# Patient Record
Sex: Female | Born: 1955
Health system: Southern US, Community
[De-identification: ages and names within clinical notes are randomized; demographics above are authoritative.]

## PROBLEM LIST (undated history)

## (undated) DIAGNOSIS — I428 Other cardiomyopathies: Principal | ICD-10-CM

## (undated) DIAGNOSIS — Z95810 Presence of automatic (implantable) cardiac defibrillator: Secondary | ICD-10-CM

## (undated) DIAGNOSIS — Z860100 Personal history of colon polyps, unspecified: Secondary | ICD-10-CM

## (undated) DIAGNOSIS — J449 Chronic obstructive pulmonary disease, unspecified: Secondary | ICD-10-CM

## (undated) DIAGNOSIS — I499 Cardiac arrhythmia, unspecified: Secondary | ICD-10-CM

## (undated) DIAGNOSIS — C801 Malignant (primary) neoplasm, unspecified: Secondary | ICD-10-CM

## (undated) DIAGNOSIS — D649 Anemia, unspecified: Secondary | ICD-10-CM

## (undated) DIAGNOSIS — I5022 Chronic systolic (congestive) heart failure: Secondary | ICD-10-CM

## (undated) DIAGNOSIS — I447 Left bundle-branch block, unspecified: Secondary | ICD-10-CM

## (undated) DIAGNOSIS — E119 Type 2 diabetes mellitus without complications: Secondary | ICD-10-CM

## (undated) DIAGNOSIS — R0602 Shortness of breath: Secondary | ICD-10-CM

## (undated) HISTORY — DX: Anemia, unspecified: D64.9

## (undated) HISTORY — DX: Type 2 diabetes mellitus without complications: E11.9

## (undated) HISTORY — PX: TUBAL LIGATION: SHX77

## (undated) HISTORY — DX: Cardiac arrhythmia, unspecified: I49.9

## (undated) HISTORY — PX: INSERT / REPLACE / REMOVE PACEMAKER: SUR710

## (undated) HISTORY — PX: ICD IMPLANT: EP1208

## (undated) HISTORY — DX: Chronic systolic (congestive) heart failure: I50.22

## (undated) HISTORY — DX: Other cardiomyopathies: I42.8

## (undated) HISTORY — DX: Shortness of breath: R06.02

## (undated) HISTORY — DX: Left bundle-branch block, unspecified: I44.7

## (undated) HISTORY — PX: APPENDECTOMY: SHX54

## (undated) SURGERY — LEFT HEART CATH
Anesthesia: Moderate Sedation | Laterality: Left

---

## 2014-12-06 ENCOUNTER — Emergency Department: Payer: Self-pay | Admitting: Emergency Medicine

## 2015-01-04 DIAGNOSIS — Z720 Tobacco use: Secondary | ICD-10-CM | POA: Insufficient documentation

## 2015-03-11 ENCOUNTER — Encounter: Payer: Self-pay | Admitting: Emergency Medicine

## 2015-03-11 ENCOUNTER — Emergency Department: Payer: No Typology Code available for payment source

## 2015-03-11 ENCOUNTER — Inpatient Hospital Stay
Admission: EM | Admit: 2015-03-11 | Discharge: 2015-03-16 | DRG: 190 | Disposition: A | Discharge status: Home / Self Care | Payer: No Typology Code available for payment source | Attending: Internal Medicine | Admitting: Internal Medicine

## 2015-03-11 DIAGNOSIS — Z791 Long term (current) use of non-steroidal anti-inflammatories (NSAID): Secondary | ICD-10-CM

## 2015-03-11 DIAGNOSIS — I209 Angina pectoris, unspecified: Secondary | ICD-10-CM | POA: Diagnosis present

## 2015-03-11 DIAGNOSIS — J441 Chronic obstructive pulmonary disease with (acute) exacerbation: Secondary | ICD-10-CM | POA: Diagnosis not present

## 2015-03-11 DIAGNOSIS — I447 Left bundle-branch block, unspecified: Secondary | ICD-10-CM | POA: Diagnosis present

## 2015-03-11 DIAGNOSIS — Z7951 Long term (current) use of inhaled steroids: Secondary | ICD-10-CM

## 2015-03-11 DIAGNOSIS — J449 Chronic obstructive pulmonary disease, unspecified: Secondary | ICD-10-CM | POA: Diagnosis present

## 2015-03-11 DIAGNOSIS — Z87891 Personal history of nicotine dependence: Secondary | ICD-10-CM

## 2015-03-11 DIAGNOSIS — I214 Non-ST elevation (NSTEMI) myocardial infarction: Secondary | ICD-10-CM | POA: Diagnosis present

## 2015-03-11 HISTORY — DX: Chronic obstructive pulmonary disease, unspecified: J44.9

## 2015-03-11 LAB — TROPONIN I: Troponin I: 0.06 ng/mL — ABNORMAL HIGH (ref <0.031)

## 2015-03-11 LAB — BASIC METABOLIC PANEL
Anion gap: 9 (ref 5–15)
BUN: 15 mg/dL (ref 6–20)
CO2: 25 mmol/L (ref 22–32)
CREATININE: 0.78 mg/dL (ref 0.44–1.00)
Calcium: 8.7 mg/dL — ABNORMAL LOW (ref 8.9–10.3)
Chloride: 102 mmol/L (ref 101–111)
GLUCOSE: 118 mg/dL — AB (ref 65–99)
Potassium: 4 mmol/L (ref 3.5–5.1)
SODIUM: 136 mmol/L (ref 135–145)

## 2015-03-11 LAB — CBC
HEMATOCRIT: 32.7 % — AB (ref 35.0–47.0)
Hemoglobin: 10.8 g/dL — ABNORMAL LOW (ref 12.0–16.0)
MCH: 29.9 pg (ref 26.0–34.0)
MCHC: 33.1 g/dL (ref 32.0–36.0)
MCV: 90.4 fL (ref 80.0–100.0)
Platelets: 226 10*3/uL (ref 150–440)
RBC: 3.62 MIL/uL — ABNORMAL LOW (ref 3.80–5.20)
RDW: 14.9 % — AB (ref 11.5–14.5)
WBC: 6.4 10*3/uL (ref 3.6–11.0)

## 2015-03-11 MED ORDER — IPRATROPIUM-ALBUTEROL 0.5-2.5 (3) MG/3ML IN SOLN
RESPIRATORY_TRACT | Status: AC
  Filled 2015-03-11: qty 3

## 2015-03-11 MED ORDER — METHYLPREDNISOLONE SODIUM SUCC 125 MG IJ SOLR
125.0000 mg | INTRAMUSCULAR | Status: AC
Start: 2015-03-11 — End: 2015-03-11
  Administered 2015-03-11: 125 mg via INTRAVENOUS

## 2015-03-11 MED ORDER — IPRATROPIUM-ALBUTEROL 0.5-2.5 (3) MG/3ML IN SOLN
3.0000 mL | Freq: Once | RESPIRATORY_TRACT | Status: AC
  Administered 2015-03-11: 3 mL via RESPIRATORY_TRACT

## 2015-03-11 MED ORDER — IPRATROPIUM-ALBUTEROL 0.5-2.5 (3) MG/3ML IN SOLN
6.0000 mL | Freq: Once | RESPIRATORY_TRACT | Status: AC
  Administered 2015-03-11: 6 mL via RESPIRATORY_TRACT

## 2015-03-11 MED ORDER — METHYLPREDNISOLONE SODIUM SUCC 125 MG IJ SOLR
INTRAMUSCULAR | Status: AC
  Administered 2015-03-11: 125 mg via INTRAVENOUS
  Filled 2015-03-11: qty 2

## 2015-03-11 MED ORDER — IPRATROPIUM-ALBUTEROL 0.5-2.5 (3) MG/3ML IN SOLN
RESPIRATORY_TRACT | Status: AC
  Administered 2015-03-11: 6 mL via RESPIRATORY_TRACT
  Filled 2015-03-11: qty 3

## 2015-03-11 MED ORDER — IPRATROPIUM-ALBUTEROL 0.5-2.5 (3) MG/3ML IN SOLN
RESPIRATORY_TRACT | Status: AC
  Administered 2015-03-11: 3 mL via RESPIRATORY_TRACT
  Filled 2015-03-11: qty 3

## 2015-03-11 NOTE — ED Provider Notes (Signed)
[facility] Emergency Department Provider Note  ____________________________________________  Time seen: Approximately 9:50 PM  I have reviewed the triage vital signs and the nursing notes.   HISTORY  Chief Complaint Shortness of Breath    HPI Margaret Hendricks is a 59 y.o. female with a history of COPD who presents with about 4 days of shortness of breath and occasional chest pain which she associates with her frequent cough.  She states that she mowed the lawn about 4 days ago and afterwards felt very congested with a frequent dry cough and increased work of breathing.  She does not normally suffer from bad seasonal allergies, but sometimes some mild symptoms.  The symptoms of persisted for several days, and she describes her chest pain is intermittent, left-sided, and associated mostly with her frequent cough.  She uses an albuterol inhaler at home for her COPD but reports that the 2 puffs twice a day that she uses has not been helping her recently.  sHe also uses fluticasone and has been trying cough medicine.   Past Medical History  Diagnosis Date  . COPD (chronic obstructive pulmonary disease)     There are no active problems to display for this patient.   Past Surgical History  Procedure Laterality Date  . Appendectomy    . Tubal ligation Bilateral     Current Outpatient Rx  Name  Route  Sig  Dispense  Refill  . albuterol (PROVENTIL HFA;VENTOLIN HFA) 108 (90 BASE) MCG/ACT inhaler   Inhalation   Inhale 2 puffs into the lungs every 6 (six) hours as needed for wheezing.         [name] buPROPion (ZYBAN) 150 MG 12 hr tablet   Oral   Take 150 mg by mouth daily.         [name] dextromethorphan-guaiFENesin (MUCINEX DM) 30-600 MG per 12 hr tablet   Oral   Take 1 tablet by mouth 2 (two) times daily as needed for cough.         . fluticasone (FLOVENT HFA) 220 MCG/ACT inhaler   Inhalation   Inhale 1 puff into the lungs 2 (two) times daily.         [name]  ibuprofen (ADVIL,MOTRIN) 200 MG tablet   Oral   Take 600 mg by mouth every 6 (six) hours as needed for mild pain.         . Multiple Vitamin (MULTIVITAMIN WITH MINERALS) TABS tablet   Oral   Take 1 tablet by mouth daily.           Allergies Review of patient's allergies indicates no known allergies.  No family history on file.  Social History History  Substance Use Topics  . Smoking status: Former Smoker    Quit date: 01/28/2015  . Smokeless tobacco: Not on file  . Alcohol Use: No    Review of Systems Constitutional: No fever/chills Eyes: No visual changes. ENT: No sore throat. Cardiovascular: Chest pain as described above Respiratory: Shortness of breath as described above.  Frequent cough sometimes with clear to white sputum Gastrointestinal: No abdominal pain.  No nausea, no vomiting.  No diarrhea.  No constipation. Genitourinary: Negative for dysuria. Musculoskeletal: Negative for back pain. Skin: Negative for rash. Neurological: Negative for headaches, focal weakness or numbness.  10-point ROS otherwise negative.  ____________________________________________   PHYSICAL EXAM:  VITAL SIGNS: ED Triage Vitals  Enc Vitals Group     BP 03/11/15 1912 143/86 mmHg     Pulse Rate 03/11/15 1912 108  Resp 03/11/15 1912 18     Temp 03/11/15 1912 98.8 F (37.1 C)     Temp Source 03/11/15 1912 Oral     SpO2 03/11/15 1912 95 %     Weight 03/11/15 1912 170 lb (77.111 kg)     Height 03/11/15 1912 5\' 3"  (1.6 m)     Head Cir --      Peak Flow --      Pain Score 03/11/15 1913 3     Pain Loc --      Pain Edu? --      Excl. in [place]? --     Constitutional: Alert and oriented. Well appearing and in no acute distress. Eyes: Conjunctivae are normal. PERRL. EOMI. Head: Atraumatic. Nose: No congestion/rhinnorhea. Mouth/Throat: Mucous membranes are moist.  Oropharynx non-erythematous. Neck: No stridor.   Cardiovascular: Mild tachycardia, regular rhythm. Grossly normal  heart sounds.  Good peripheral circulation. Respiratory: Normal respiratory effort.  Mild wheezing bilaterally.  No retractions. Gastrointestinal: Soft and nontender. No distention. No abdominal bruits. No CVA tenderness. Musculoskeletal: No lower extremity tenderness nor edema.  No joint effusions. Neurologic:  Normal speech and language. No gross focal neurologic deficits are appreciated. Speech is normal. No gait instability. Skin:  Skin is warm, dry and intact. No rash noted. Psychiatric: Mood and affect are normal. Speech and behavior are normal.  ____________________________________________   LABS (all labs ordered are listed, but only abnormal results are displayed)  Labs Reviewed  CBC - Abnormal; Notable for the following:    RBC 3.62 (*)    Hemoglobin 10.8 (*)    HCT 32.7 (*)    RDW 14.9 (*)    All other components within normal limits  BASIC METABOLIC PANEL - Abnormal; Notable for the following:    Glucose, Bld 118 (*)    Calcium 8.7 (*)    All other components within normal limits  TROPONIN I - Abnormal; Notable for the following:    Troponin I 0.06 (*)    All other components within normal limits  TROPONIN I   ____________________________________________  EKG  ED ECG REPORT   Date: 03/11/2015  EKG Time: 19:37  Rate: 105  Rhythm: there are no previous tracings available for comparison, sinus tachycardia  Axis: Normal  Intervals:left bundle branch block  ST&T Change: Non-specific ST segment / T-wave changes, but no evidence of acute ischemia. Does not meet Sgarbossa criteria for Acute MI with LBBB  ____________________________________________  RADIOLOGY  Dg Chest 2 View  03/11/2015   CLINICAL DATA:  Acute onset of shortness of breath and chest pain. Initial encounter.  EXAM: CHEST  2 VIEW  COMPARISON:  Chest radiograph performed 12/06/2014  FINDINGS: The lungs are well-aerated. Mildly increased interstitial markings are again noted, without definite focal  consolidation or pulmonary edema. There is no evidence of pleural effusion or pneumothorax.  A 1.2 cm nodule overlying the right midlung zone has not changed significantly in size from the prior study, but CT of the chest is recommended for further evaluation, on an elective nonemergent basis.  The heart is mildly enlarged. No acute osseous abnormalities are seen.  IMPRESSION: 1. 1.2 cm nodule overlying the right midlung zone has not changed significantly in size from February, but CT of the chest is recommended for further evaluation, on an elective nonemergent basis. 2. Mildly increased interstitial markings again noted; this may be related to the patient's history of smoking. No focal consolidation or pulmonary edema seen.   Electronically Signed   By: Jacqulynn Cadet  Chang M.D.   On: 03/11/2015 19:42    ____________________________________________   PROCEDURES  Procedure(s) performed: None  Critical Care performed: Yes, see critical care note(s)   CRITICAL CARE Performed by: Hinda Kehr   Total critical care time: 30 mins  Critical care time was exclusive of separately billable procedures and treating other patients.  Critical care was necessary to treat or prevent imminent or life-threatening deterioration.  Critical care was time spent personally by me on the following activities: development of treatment plan with patient and/or surrogate as well as nursing, discussions with consultants, evaluation of patient's response to treatment, examination of patient, obtaining history from patient or surrogate, ordering and performing treatments and interventions, ordering and review of laboratory studies, ordering and review of radiographic studies, pulse oximetry and re-evaluation of patient's condition.   ____________________________________________   INITIAL IMPRESSION / ASSESSMENT AND PLAN / ED COURSE  Pertinent labs & imaging results that were available during my care of the patient were  reviewed by me and considered in my medical decision making (see chart for details).  The patient's history is most consistent with a COPD exacerbation likely a result of an allergic reaction after being outside several days ago.  Her troponin is slightly elevated at 0.06 which may represent demand ischemia.  She states that she feels better after DuoNeb.  She states also that she does not want to be admitted to the hospital she does not have to.  Unfortunately I do not have an old EKG to compare her to current EKG, but she does not meet Sgarbossa criteria for an MI in the setting of LBBB, and she is well-appearing and in no distress at this time.  I discussed with her several options of care, and we agreed at this time to do 2 additional DuoNeb and treated with Solu-Medrol for a COPD exacerbation/allergic reaction.  Additionally, we will redraw a 4 hour troponin to determine its trend.  If she is feeling better and her troponin has not gone up significantly, she would prefer to go home and follow-up with her regular doctor (Dr. Loney Hering).  However, if she is symptomatic and/or her troponin has gone up, she would be appropriate for inpatient admission/observation.  ----------------------------------------- 12:44 AM on 03/12/2015 -----------------------------------------  The patient's breathing has improved after the breathing treatments and Solu-Medrol.  However, she states that she has had multiple episodes of sharp left-sided chest pain while lying in her bed over the last hour or so.  Given her abnormal EKG, consistently positive troponins, difficulty breathing, SPO2 of around 90-92% at rest, and chest pain at rest, it is appropriate to bring her in to the hospital for treatment for both COPD exacerbation and her chest pain.  Given that she has had 2 positive troponins, I will treat her with a dose of Lovenox as well as full dose aspirin.  I discussed the plan with the patient and her family and she  agrees. ____________________________________________   FINAL CLINICAL IMPRESSION(S) / ED DIAGNOSES  Final diagnoses:  Ischemic chest pain  COPD with acute exacerbation  Non-ST elevation MI (NSTEMI)     Hinda Kehr, MD 03/12/15 [id number]

## 2015-03-11 NOTE — ED Notes (Addendum)
Pt reports having pain to left chest at left breast and towards left axillary region.  Pt reports pain started when she began coughing.  Pt reports when she coughs it is productive with clear/milky color.

## 2015-03-11 NOTE — ED Notes (Signed)
Dr Karma Greaser notified troponin 0.06

## 2015-03-11 NOTE — ED Notes (Signed)
Pt in with co shob and chest pain since Monday, hx of copd.  Some shob noted in triage started after mowing the lawn.

## 2015-03-12 ENCOUNTER — Encounter: Payer: Self-pay | Admitting: Internal Medicine

## 2015-03-12 DIAGNOSIS — J449 Chronic obstructive pulmonary disease, unspecified: Secondary | ICD-10-CM | POA: Diagnosis present

## 2015-03-12 LAB — BASIC METABOLIC PANEL
Anion gap: 8 (ref 5–15)
BUN: 12 mg/dL (ref 6–20)
CO2: 25 mmol/L (ref 22–32)
Calcium: 8.6 mg/dL — ABNORMAL LOW (ref 8.9–10.3)
Chloride: 105 mmol/L (ref 101–111)
Creatinine, Ser: 0.62 mg/dL (ref 0.44–1.00)
Glucose, Bld: 159 mg/dL — ABNORMAL HIGH (ref 65–99)
POTASSIUM: 3.7 mmol/L (ref 3.5–5.1)
SODIUM: 138 mmol/L (ref 135–145)

## 2015-03-12 LAB — TROPONIN I
TROPONIN I: 0.08 ng/mL — AB (ref <0.031)
TROPONIN I: 0.06 ng/mL — AB (ref <0.031)
TROPONIN I: 0.05 ng/mL — AB (ref <0.031)
TROPONIN I: 0.04 ng/mL — AB (ref <0.031)

## 2015-03-12 LAB — CBC
HCT: 31.7 % — ABNORMAL LOW (ref 35.0–47.0)
HEMOGLOBIN: 10.6 g/dL — AB (ref 12.0–16.0)
MCH: 30 pg (ref 26.0–34.0)
MCHC: 33.5 g/dL (ref 32.0–36.0)
MCV: 89.7 fL (ref 80.0–100.0)
PLATELETS: 228 10*3/uL (ref 150–440)
RBC: 3.53 MIL/uL — AB (ref 3.80–5.20)
RDW: 14.7 % — AB (ref 11.5–14.5)
WBC: 4.6 10*3/uL (ref 3.6–11.0)

## 2015-03-12 MED ORDER — IPRATROPIUM-ALBUTEROL 0.5-2.5 (3) MG/3ML IN SOLN
3.0000 mL | RESPIRATORY_TRACT | Status: DC | PRN
  Administered 2015-03-12 – 2015-03-14 (×2): 3 mL via RESPIRATORY_TRACT
  Filled 2015-03-12 (×3): qty 3

## 2015-03-12 MED ORDER — FLUTICASONE PROPIONATE HFA 220 MCG/ACT IN AERO: 1.0000 | INHALATION_SPRAY | Freq: Two times a day (BID) | RESPIRATORY_TRACT | Status: DC

## 2015-03-12 MED ORDER — MOMETASONE FURO-FORMOTEROL FUM 100-5 MCG/ACT IN AERO
2.0000 | INHALATION_SPRAY | Freq: Two times a day (BID) | RESPIRATORY_TRACT | Status: DC
  Administered 2015-03-12 – 2015-03-16 (×8): 2 via RESPIRATORY_TRACT
  Filled 2015-03-12: qty 8.8

## 2015-03-12 MED ORDER — SODIUM CHLORIDE 0.9 % WEIGHT BASED INFUSION
1.0000 mL/kg/h | INTRAVENOUS | Status: DC
  Administered 2015-03-13 – 2015-03-14 (×2): 1 mL/kg/h via INTRAVENOUS

## 2015-03-12 MED ORDER — BUDESONIDE 0.5 MG/2ML IN SUSP
0.5000 mg | Freq: Two times a day (BID) | RESPIRATORY_TRACT | Status: DC
  Administered 2015-03-12 – 2015-03-16 (×8): 0.5 mg via RESPIRATORY_TRACT
  Filled 2015-03-12 (×9): qty 2

## 2015-03-12 MED ORDER — GUAIFENESIN ER 600 MG PO TB12
600.0000 mg | ORAL_TABLET | Freq: Two times a day (BID) | ORAL | Status: DC
  Administered 2015-03-12 – 2015-03-16 (×9): 600 mg via ORAL
  Filled 2015-03-12 (×9): qty 1

## 2015-03-12 MED ORDER — HYDROCOD POLST-CPM POLST ER 10-8 MG/5ML PO SUER
5.0000 mL | Freq: Two times a day (BID) | ORAL | Status: DC | PRN
  Administered 2015-03-13 – 2015-03-15 (×2): 5 mL via ORAL
  Filled 2015-03-12 (×3): qty 5

## 2015-03-12 MED ORDER — ASPIRIN 81 MG PO CHEW
81.0000 mg | CHEWABLE_TABLET | ORAL | Status: AC
  Administered 2015-03-13: 81 mg via ORAL
  Filled 2015-03-12: qty 1

## 2015-03-12 MED ORDER — ENOXAPARIN SODIUM 100 MG/ML ~~LOC~~ SOLN
SUBCUTANEOUS | Status: AC
  Administered 2015-03-12: 80 mg via SUBCUTANEOUS
  Filled 2015-03-12: qty 1

## 2015-03-12 MED ORDER — ASPIRIN 81 MG PO CHEW
CHEWABLE_TABLET | ORAL | Status: AC
  Administered 2015-03-12: 324 mg via ORAL
  Filled 2015-03-12: qty 4

## 2015-03-12 MED ORDER — ASPIRIN 81 MG PO CHEW
324.0000 mg | CHEWABLE_TABLET | Freq: Once | ORAL | Status: AC
  Administered 2015-03-12: 324 mg via ORAL

## 2015-03-12 MED ORDER — SENNOSIDES-DOCUSATE SODIUM 8.6-50 MG PO TABS: 1.0000 | ORAL_TABLET | Freq: Every evening | ORAL | Status: DC | PRN

## 2015-03-12 MED ORDER — BUPROPION HCL ER (SMOKING DET) 150 MG PO TB12
150.0000 mg | ORAL_TABLET | Freq: Every day | ORAL | Status: DC
  Administered 2015-03-12 – 2015-03-16 (×5): 150 mg via ORAL
  Filled 2015-03-12 (×8): qty 1

## 2015-03-12 MED ORDER — SODIUM CHLORIDE 0.9 % WEIGHT BASED INFUSION
3.0000 mL/kg/h | INTRAVENOUS | Status: AC
  Administered 2015-03-13: 3 mL/kg/h via INTRAVENOUS

## 2015-03-12 MED ORDER — ENOXAPARIN SODIUM 100 MG/ML ~~LOC~~ SOLN
80.0000 mg | SUBCUTANEOUS | Status: AC
  Administered 2015-03-12: 80 mg via SUBCUTANEOUS

## 2015-03-12 MED ORDER — ACETAMINOPHEN 650 MG RE SUPP: 650.0000 mg | Freq: Four times a day (QID) | RECTAL | Status: DC | PRN

## 2015-03-12 MED ORDER — SODIUM CHLORIDE 0.9 % IJ SOLN
3.0000 mL | Freq: Two times a day (BID) | INTRAMUSCULAR | Status: DC
  Administered 2015-03-12 – 2015-03-13 (×2): 3 mL via INTRAVENOUS

## 2015-03-12 MED ORDER — IPRATROPIUM-ALBUTEROL 20-100 MCG/ACT IN AERS
1.0000 | INHALATION_SPRAY | Freq: Four times a day (QID) | RESPIRATORY_TRACT | Status: DC
  Administered 2015-03-12 – 2015-03-16 (×12): 1 via RESPIRATORY_TRACT
  Filled 2015-03-12: qty 4

## 2015-03-12 MED ORDER — SODIUM CHLORIDE 0.9 % IV SOLN: 250.0000 mL | INTRAVENOUS | Status: DC | PRN

## 2015-03-12 MED ORDER — ACETAMINOPHEN 325 MG PO TABS: 650.0000 mg | ORAL_TABLET | Freq: Four times a day (QID) | ORAL | Status: DC | PRN

## 2015-03-12 MED ORDER — SODIUM CHLORIDE 0.9 % IJ SOLN: 3.0000 mL | INTRAMUSCULAR | Status: DC | PRN

## 2015-03-12 MED ORDER — SODIUM CHLORIDE 0.9 % IJ SOLN
3.0000 mL | Freq: Two times a day (BID) | INTRAMUSCULAR | Status: DC
Start: 2015-03-12 — End: 2015-03-16
  Administered 2015-03-12 – 2015-03-15 (×8): 3 mL via INTRAVENOUS

## 2015-03-12 NOTE — Progress Notes (Signed)
[facility] [place] NAME: Margaret Hendricks    MR#:  [id number]  DATE OF BIRTH:  September 04, 1956  SUBJECTIVE:Admitted for copd exacerbation.less wheezing,cough bettertoday.slightly elevated troponin but no  Chest pain.  CHIEF COMPLAINT:   Chief Complaint  Patient presents with  . Shortness of Breath    REVIEW OF SYSTEMS:    Review of Systems  Constitutional: Negative for fever and chills.  HENT: Negative for hearing loss.   Eyes: Negative for blurred vision, double vision and photophobia.  Respiratory: Positive for cough. Negative for hemoptysis and shortness of breath.   Cardiovascular: Negative for palpitations, orthopnea and leg swelling.  Gastrointestinal: Negative for vomiting, abdominal pain and diarrhea.  Genitourinary: Negative for dysuria and urgency.  Musculoskeletal: Negative for myalgias and neck pain.  Skin: Negative for rash.  Neurological: Negative for dizziness, focal weakness, seizures, weakness and headaches.  Psychiatric/Behavioral: Negative for memory loss. The patient does not have insomnia.     Nutrition:  Tolerating Diet: Tolerating PT:      DRUG ALLERGIES:  No Known Allergies  VITALS:  Blood pressure 130/72, pulse 87, temperature 97.9 F (36.6 C), temperature source Oral, resp. rate 23, height 5\' 3"  (1.6 m), weight 75.388 kg (166 lb 3.2 oz), SpO2 94 %.  PHYSICAL EXAMINATION:   Physical Exam  GENERAL:  59 y.o.-year-old patient lying in the bed with no acute distress.  EYES: Pupils equal, round, reactive to light and accommodation. No scleral icterus. Extraocular muscles intact.  HEENT: Head atraumatic, normocephalic. Oropharynx and nasopharynx clear.  NECK:  Supple, no jugular venous distention. No thyroid enlargement, no tenderness.  LUNGS: bilaterally clear with occasional expiratory wheezing. CARDIOVASCULAR: S1, S2 normal. No murmurs, rubs, or gallops.  ABDOMEN: Soft, nontender, nondistended.  Bowel sounds present. No organomegaly or mass.  EXTREMITIES: No pedal edema, cyanosis, or clubbing.  NEUROLOGIC: Cranial nerves II through XII are intact. Muscle strength 5/5 in all extremities. Sensation intact. Gait not checked.  PSYCHIATRIC: The patient is alert and oriented x 3.  SKIN: No obvious rash, lesion, or ulcer.    LABORATORY PANEL:   CBC  Recent Labs Lab 03/12/15 0423  WBC 4.6  HGB 10.6*  HCT 31.7*  PLT 228   ------------------------------------------------------------------------------------------------------------------  Chemistries   Recent Labs Lab 03/12/15 0423  NA 138  K 3.7  CL 105  CO2 25  GLUCOSE 159*  BUN 12  CREATININE 0.62  CALCIUM 8.6*   ------------------------------------------------------------------------------------------------------------------  Cardiac Enzymes  Recent Labs Lab 03/12/15 0423  TROPONINI 0.06*   ------------------------------------------------------------------------------------------------------------------  RADIOLOGY:  Dg Chest 2 View  03/11/2015   CLINICAL DATA:  Acute onset of shortness of breath and chest pain. Initial encounter.  EXAM: CHEST  2 VIEW  COMPARISON:  Chest radiograph performed 12/06/2014  FINDINGS: The lungs are well-aerated. Mildly increased interstitial markings are again noted, without definite focal consolidation or pulmonary edema. There is no evidence of pleural effusion or pneumothorax.  A 1.2 cm nodule overlying the right midlung zone has not changed significantly in size from the prior study, but CT of the chest is recommended for further evaluation, on an elective nonemergent basis.  The heart is mildly enlarged. No acute osseous abnormalities are seen.  IMPRESSION: 1. 1.2 cm nodule overlying the right midlung zone has not changed significantly in size from February, but CT of the chest is recommended for further evaluation, on an elective nonemergent basis. 2. Mildly increased interstitial  markings again noted; this may be related to the patient's  history of smoking. No focal consolidation or pulmonary edema seen.   Electronically Signed   By: Garald Balding M.D.   On: 03/11/2015 19:42     ASSESSMENT AND PLAN:  #69;59 year old female patient with COPD to admission. Continue her nebulizers, Pulmicort nebulizer, Mucinex for cough. #2: Slightly elevated troponins of unclear significance, likely secondary to COPD exacerbation and respiratory distress. Patient had no chest pain. Check echocardiogram, monitor on telemetry. Cardiology consult is appreciated . #3'continue DVT prophylaxis with Lovenox     All the records are reviewed and case discussed with Care Management/Social Workerr. Management plans discussed with the patient, family and they are in agreement.  CODE STATUS:full code  TOTAL TIME TAKING CARE OF THIS PATIENT: 30 minutes.   POSSIBLE D/C IN 1-2 DAYS, DEPENDING ON CLINICAL CONDITION.   Epifanio Lesches M.D on 03/12/2015 at 8:08 AM  Between 7am to 6pm - Pager - [phone]  After 6pm go to [email or web address] - password [email or web address] [facility]  [facility]  Office  [phone]  CC: Primary care physician; Willa Frater, MD

## 2015-03-12 NOTE — ED Notes (Signed)
Report called to floor, given to Kristine Garbe, Therapist, sports.

## 2015-03-12 NOTE — H&P (Signed)
[facility] [place] NAME: Margaret Hendricks    MR#:  [id number]  DATE OF BIRTH:  [birth year]  DATE OF ADMISSION:  03/11/2015  PRIMARY CARE PHYSICIAN: Willa Frater, MD   REQUESTING/REFERRING PHYSICIAN: Karma Greaser  CHIEF COMPLAINT:   Chief Complaint  Patient presents with  . Shortness of Breath    HISTORY OF PRESENT ILLNESS:  Margaret Hendricks  is a 59 y.o. female who presents with chest pain. Patient is a medical history only of COPD. States that she has had increased use of her rescue inhaler over the past week or so. She did some yard work earlier this week and has had increased shortness of breath symptoms that she felt was likely related to her COPD. However one day prior to admission she began having some left-sided chest pain that radiated to her left arm. This chest pain was intermittent, and with some of the episodes would also radiate to her right jaw. She did have some diaphoresis associated with some episodes. She denies any nausea vomiting abdominal pain, dizziness, blurred vision, and states that the pain was not necessarily exclusively exertional. Evaluation in the ED she was treated for COPD with Solu-Medrol and nebulizer, after which she felt like she was breathing better. Her chest pain resolved. However, her troponin came back positive, and the second one continued to trend up. She has a left bundle-branch block on EKG, it is unknown whether this is new or not. Hospitalists were called for admission to trend her enzymes, and have cardiology see her for potential non-STEMI.  PAST MEDICAL HISTORY:   Past Medical History  Diagnosis Date  . COPD (chronic obstructive pulmonary disease)     PAST SURGICAL HISTORY:   Past Surgical History  Procedure Laterality Date  . Appendectomy    . Tubal ligation Bilateral     SOCIAL HISTORY:   History  Substance Use Topics  . Smoking status: Former Smoker    Quit date:  01/28/2015  . Smokeless tobacco: Not on file  . Alcohol Use: No    FAMILY HISTORY:   Family History  Problem Relation Age of Onset  . Hypertension    . Heart attack      DRUG ALLERGIES:  No Known Allergies  MEDICATIONS AT HOME:   Prior to Admission medications   Medication Sig Start Date End Date Taking? Authorizing Provider  albuterol (PROVENTIL HFA;VENTOLIN HFA) 108 (90 BASE) MCG/ACT inhaler Inhale 2 puffs into the lungs every 6 (six) hours as needed for wheezing.   Yes Historical Provider, MD  buPROPion (ZYBAN) 150 MG 12 hr tablet Take 150 mg by mouth daily.   Yes Historical Provider, MD  dextromethorphan-guaiFENesin (MUCINEX DM) 30-600 MG per 12 hr tablet Take 1 tablet by mouth 2 (two) times daily as needed for cough.   Yes Historical Provider, MD  fluticasone (FLOVENT HFA) 220 MCG/ACT inhaler Inhale 1 puff into the lungs 2 (two) times daily.   Yes Historical Provider, MD  ibuprofen (ADVIL,MOTRIN) 200 MG tablet Take 600 mg by mouth every 6 (six) hours as needed for mild pain.   Yes Historical Provider, MD  Multiple Vitamin (MULTIVITAMIN WITH MINERALS) TABS tablet Take 1 tablet by mouth daily.   Yes Historical Provider, MD    REVIEW OF SYSTEMS:  Review of Systems  Constitutional: Negative for fever, chills, weight loss and malaise/fatigue.  HENT: Negative for ear pain, hearing loss and tinnitus.   Eyes: Negative for blurred vision, double vision, pain  and redness.  Respiratory: Positive for shortness of breath and wheezing. Negative for cough and hemoptysis.   Cardiovascular: Positive for chest pain. Negative for palpitations, orthopnea and leg swelling.  Gastrointestinal: Negative for nausea, vomiting, abdominal pain, diarrhea and constipation.  Genitourinary: Negative for dysuria, frequency and hematuria.  Musculoskeletal: Negative for back pain, joint pain and neck pain.  Skin:       No acne, rash, or lesions  Neurological: Negative for dizziness, tremors, focal weakness  and weakness.  Endo/Heme/Allergies: Negative for polydipsia. Does not bruise/bleed easily.  Psychiatric/Behavioral: Negative for depression. The patient is not nervous/anxious and does not have insomnia.      VITAL SIGNS:   Filed Vitals:   03/11/15 2330 03/12/15 0000 03/12/15 0030 03/12/15 0100  BP: 118/68 111/63 109/60 116/89  Pulse: 94 89 89 89  Temp:      TempSrc:      Resp: 22 23 24 22   Height:      Weight:      SpO2: 91% 89% 90% 90%   Wt Readings from Last 3 Encounters:  03/11/15 77.111 kg (170 lb)    PHYSICAL EXAMINATION:  Physical Exam  Constitutional: She is oriented to person, place, and time. She appears well-developed and well-nourished. No distress.  HENT:  Head: Normocephalic and atraumatic.  Mouth/Throat: Oropharynx is clear and moist.  Eyes: Conjunctivae and EOM are normal. Pupils are equal, round, and reactive to light. No scleral icterus.  Neck: Normal range of motion. Neck supple. No JVD present. No thyromegaly present.  Cardiovascular: Normal rate, regular rhythm and intact distal pulses.  Exam reveals no gallop and no friction rub.   No murmur heard. Respiratory: Effort normal and breath sounds normal. No respiratory distress. She has no wheezes. She has no rales.  GI: Soft. Bowel sounds are normal. She exhibits no distension. There is no tenderness.  Musculoskeletal: Normal range of motion. She exhibits no edema.  No arthritis, no gout  Lymphadenopathy:    She has no cervical adenopathy.  Neurological: She is alert and oriented to person, place, and time. No cranial nerve deficit.  No dysarthria, no aphasia  Skin: Skin is warm and dry. No rash noted. No erythema.  Psychiatric: She has a normal mood and affect. Her behavior is normal. Judgment and thought content normal.    LABORATORY PANEL:   CBC  Recent Labs Lab 03/11/15 2003  WBC 6.4  HGB 10.8*  HCT 32.7*  PLT 226    ------------------------------------------------------------------------------------------------------------------  Chemistries   Recent Labs Lab 03/11/15 2003  NA 136  K 4.0  CL 102  CO2 25  GLUCOSE 118*  BUN 15  CREATININE 0.78  CALCIUM 8.7*   ------------------------------------------------------------------------------------------------------------------  Cardiac Enzymes  Recent Labs Lab 03/11/15 2315  TROPONINI 0.08*   ------------------------------------------------------------------------------------------------------------------  RADIOLOGY:  Dg Chest 2 View  03/11/2015   CLINICAL DATA:  Acute onset of shortness of breath and chest pain. Initial encounter.  EXAM: CHEST  2 VIEW  COMPARISON:  Chest radiograph performed 12/06/2014  FINDINGS: The lungs are well-aerated. Mildly increased interstitial markings are again noted, without definite focal consolidation or pulmonary edema. There is no evidence of pleural effusion or pneumothorax.  A 1.2 cm nodule overlying the right midlung zone has not changed significantly in size from the prior study, but CT of the chest is recommended for further evaluation, on an elective nonemergent basis.  The heart is mildly enlarged. No acute osseous abnormalities are seen.  IMPRESSION: 1. 1.2 cm nodule overlying the right midlung  zone has not changed significantly in size from February, but CT of the chest is recommended for further evaluation, on an elective nonemergent basis. 2. Mildly increased interstitial markings again noted; this may be related to the patient's history of smoking. No focal consolidation or pulmonary edema seen.   Electronically Signed   By: Garald Balding M.D.   On: 03/11/2015 19:42    EKG:   Orders placed or performed during the hospital encounter of 03/11/15  . ED EKG  . ED EKG    IMPRESSION AND PLAN:  Principal Problem:   Chest pain at rest - positive troponin, see below. Given a left bundle branch on EKG of  unknown chronicity, hospitalists were called for admission for possible non-STEMI. Chest pain is resolved at this time. Active Problems:   COPD (chronic obstructive pulmonary disease) - we'll continue her home inhaler, and have when necessary duo nebs available while here. He got a dose of Solu-Medrol in the ED, she may need to continue this if it is felt that her problem is more COPD exacerbation rather than or in addition to non-STEMI.   Elevated troponin - mildly elevated, but second set was trending up slowly. We'll continue to trend her cardiac enzymes, get an echocardiogram, consult cardiology for their recommendations.  All the records are reviewed and case discussed with ED provider. Management plans discussed with the patient and/or family.  DVT PROPHYLAXIS: She was given ACS dose of Lovenox in the ED. We'll continue this.  ADMISSION STATUS: Observation  CODE STATUS: Full  TOTAL TIME TAKING CARE OF THIS PATIENT: 50 minutes.    Jeremi Losito [place] 03/12/2015, 1:30 AM  [facility]  Office  [phone]  CC: Primary care physician; Willa Frater, MD

## 2015-03-12 NOTE — Progress Notes (Signed)
   03/12/15 1000  Clinical Encounter Type  Visited With Patient;Family  Visit Type Spiritual support;Other (Comment) (Education on Magazine features editor))  Referral From Nurse  Consult/Referral To [place];Other (Comment) (HCPOA)  Stress Factors  Patient Stress Factors None identified  Family Stress Factors None identified  Advance Directives (For Healthcare)  Does patient have an advance directive? No  Would patient like information on creating an advanced directive? Yes - Research scientist (medical) provided education on AD. Chaplain provided therapeutic presence.  AD. [phone]

## 2015-03-12 NOTE — H&P (Signed)
Margaret Hendricks is a 59 y.o. female  [id number]  Primary Cardiologist: Neoma Laming Reason for Consultation: Chest pain and non-STEMI  HPI: This is a 59 year old white female who presented to the emergency room with chest pain. She states that she was working in the yard and developed severe shortness of breath associated with chest pain. She has a history of COPD and has been taking inhalers of different variety from her primary care physician thus felt that the her symptoms were related to COPD. She was also given Solu-Medrol and nebulizer treatment in the emergency room because she was very short of breath. Her chest pain resolved however her troponin came back positive. She was thus admitted to the hospital since EKG also showed left bundle branch block. I was asked to evaluate the patient because of possibility of non-STEMI. Patient's continues to still have intermittent pressure type chest pain radiating to the left arm.   Review of Systems: Review of Systems  Respiratory: Positive for cough, shortness of breath and wheezing.   Cardiovascular: Positive for chest pain.  All other systems reviewed and are negative.     Past Medical History  Diagnosis Date  . COPD (chronic obstructive pulmonary disease)     Medications Prior to Admission  Medication Sig Dispense Refill  . albuterol (PROVENTIL HFA;VENTOLIN HFA) 108 (90 BASE) MCG/ACT inhaler Inhale 2 puffs into the lungs every 6 (six) hours as needed for wheezing.    [name] buPROPion (ZYBAN) 150 MG 12 hr tablet Take 150 mg by mouth daily.    [name] dextromethorphan-guaiFENesin (MUCINEX DM) 30-600 MG per 12 hr tablet Take 1 tablet by mouth 2 (two) times daily as needed for cough.    . fluticasone (FLOVENT HFA) 220 MCG/ACT inhaler Inhale 1 puff into the lungs 2 (two) times daily.    [name] ibuprofen (ADVIL,MOTRIN) 200 MG tablet Take 600 mg by mouth every 6 (six) hours as needed for mild pain.    . Multiple Vitamin (MULTIVITAMIN WITH MINERALS) TABS  tablet Take 1 tablet by mouth daily.       . budesonide (PULMICORT) nebulizer solution  0.5 mg Nebulization BID  . buPROPion  150 mg Oral Daily  . sodium chloride  3 mL Intravenous Q12H    Infusions:    No Known Allergies  History   Social History  . Marital Status: Married    Spouse Name: N/A  . Number of Children: N/A  . Years of Education: N/A   Occupational History  . Not on file.   Social History Main Topics  . Smoking status: Former Smoker    Quit date: 01/28/2015  . Smokeless tobacco: Not on file  . Alcohol Use: No  . Drug Use: No  . Sexual Activity: Not on file   Other Topics Concern  . Not on file   Social History Narrative    Family History  Problem Relation Age of Onset  . Hypertension    . Heart attack      PHYSICAL EXAM: Filed Vitals:   03/12/15 0700  BP: 117/61  Pulse:   Temp: 97.8 F (36.6 C)  Resp:      Intake/Output Summary (Last 24 hours) at 03/12/15 1102 Last data filed at 03/12/15 0907  Gross per 24 hour  Intake      0 ml  Output    800 ml  Net   -800 ml    General:  Well appearing. No respiratory difficulty HEENT: normal Neck: supple. no JVD. Carotids 2+  bilat; no bruits. No lymphadenopathy or thryomegaly appreciated. Cor: PMI nondisplaced. Regular rate & rhythm. No rubs, gallops or murmurs. Lungs: clear Abdomen: soft, nontender, nondistended. No hepatosplenomegaly. No bruits or masses. Good bowel sounds. Extremities: no cyanosis, clubbing, rash, edema Neuro: alert & oriented x 3, cranial nerves grossly intact. moves all 4 extremities w/o difficulty. Affect pleasant.  ECG: EKG shows sinus tachycardia 105 bpm left atrial enlargement with left bundle branch block.  Results for orders placed or performed during the hospital encounter of 03/11/15 (from the past 24 hour(s))  CBC     Status: Abnormal   Collection Time: 03/11/15  8:03 PM  Result Value Ref Range   WBC 6.4 3.6 - 11.0 K/uL   RBC 3.62 (L) 3.80 - 5.20 MIL/uL    Hemoglobin 10.8 (L) 12.0 - 16.0 g/dL   HCT 32.7 (L) 35.0 - 47.0 %   MCV 90.4 80.0 - 100.0 fL   MCH 29.9 26.0 - 34.0 pg   MCHC 33.1 32.0 - 36.0 g/dL   RDW 14.9 (H) 11.5 - 14.5 %   Platelets 226 150 - 440 K/uL  Basic metabolic panel     Status: Abnormal   Collection Time: 03/11/15  8:03 PM  Result Value Ref Range   Sodium 136 135 - 145 mmol/L   Potassium 4.0 3.5 - 5.1 mmol/L   Chloride 102 101 - 111 mmol/L   CO2 25 22 - 32 mmol/L   Glucose, Bld 118 (H) 65 - 99 mg/dL   BUN 15 6 - 20 mg/dL   Creatinine, Ser 0.78 0.44 - 1.00 mg/dL   Calcium 8.7 (L) 8.9 - 10.3 mg/dL   GFR calc non Af Amer >60 >60 mL/min   GFR calc Af Amer >60 >60 mL/min   Anion gap 9 5 - 15  Troponin I     Status: Abnormal   Collection Time: 03/11/15  8:03 PM  Result Value Ref Range   Troponin I 0.06 (H) <0.031 ng/mL  Troponin I     Status: Abnormal   Collection Time: 03/11/15 11:15 PM  Result Value Ref Range   Troponin I 0.08 (H) <0.031 ng/mL  Troponin I     Status: Abnormal   Collection Time: 03/12/15  4:23 AM  Result Value Ref Range   Troponin I 0.06 (H) <0.031 ng/mL  Basic metabolic panel     Status: Abnormal   Collection Time: 03/12/15  4:23 AM  Result Value Ref Range   Sodium 138 135 - 145 mmol/L   Potassium 3.7 3.5 - 5.1 mmol/L   Chloride 105 101 - 111 mmol/L   CO2 25 22 - 32 mmol/L   Glucose, Bld 159 (H) 65 - 99 mg/dL   BUN 12 6 - 20 mg/dL   Creatinine, Ser 0.62 0.44 - 1.00 mg/dL   Calcium 8.6 (L) 8.9 - 10.3 mg/dL   GFR calc non Af Amer >60 >60 mL/min   GFR calc Af Amer >60 >60 mL/min   Anion gap 8 5 - 15  CBC     Status: Abnormal   Collection Time: 03/12/15  4:23 AM  Result Value Ref Range   WBC 4.6 3.6 - 11.0 K/uL   RBC 3.53 (L) 3.80 - 5.20 MIL/uL   Hemoglobin 10.6 (L) 12.0 - 16.0 g/dL   HCT 31.7 (L) 35.0 - 47.0 %   MCV 89.7 80.0 - 100.0 fL   MCH 30.0 26.0 - 34.0 pg   MCHC 33.5 32.0 - 36.0 g/dL   RDW 14.7 (  H) 11.5 - 14.5 %   Platelets 228 150 - 440 K/uL  Troponin I     Status: Abnormal    Collection Time: 03/12/15  8:51 AM  Result Value Ref Range   Troponin I 0.05 (H) <0.031 ng/mL   Dg Chest 2 View  03/11/2015   CLINICAL DATA:  Acute onset of shortness of breath and chest pain. Initial encounter.  EXAM: CHEST  2 VIEW  COMPARISON:  Chest radiograph performed 12/06/2014  FINDINGS: The lungs are well-aerated. Mildly increased interstitial markings are again noted, without definite focal consolidation or pulmonary edema. There is no evidence of pleural effusion or pneumothorax.  A 1.2 cm nodule overlying the right midlung zone has not changed significantly in size from the prior study, but CT of the chest is recommended for further evaluation, on an elective nonemergent basis.  The heart is mildly enlarged. No acute osseous abnormalities are seen.  IMPRESSION: 1. 1.2 cm nodule overlying the right midlung zone has not changed significantly in size from February, but CT of the chest is recommended for further evaluation, on an elective nonemergent basis. 2. Mildly increased interstitial markings again noted; this may be related to the patient's history of smoking. No focal consolidation or pulmonary edema seen.   Electronically Signed   By: Garald Balding M.D.   On: 03/11/2015 19:42     ASSESSMENT AND PLAN: Acute coronary syndrome with intermittent pressure type chest pain at rest associated with mildly elevated troponin as noted above. Patient still has intermittent chest pain radiating to the left arm associated with shortness of breath. This sounds more like acute coronary syndrome thus advise the patient to be treated with heparin or Lovenox and aspirin nitrates. Also discussed with the patient Talmage Nap about doing left heart catheterization first thing Monday morning ,and patient has agreed to the procedure.  Macdonald Rigor A

## 2015-03-13 ENCOUNTER — Observation Stay
Admit: 2015-03-13 | Discharge: 2015-03-13 | Disposition: A | Discharge status: Home / Self Care | Payer: No Typology Code available for payment source | Attending: Cardiovascular Disease | Admitting: Cardiovascular Disease

## 2015-03-13 MED ORDER — METOPROLOL TARTRATE 25 MG PO TABS
12.5000 mg | ORAL_TABLET | Freq: Two times a day (BID) | ORAL | Status: DC
  Administered 2015-03-13 (×2): 12.5 mg via ORAL
  Filled 2015-03-13 (×2): qty 1

## 2015-03-13 NOTE — Progress Notes (Signed)
SUBJECTIVE: Patient was sleeping this morning. She says she had one episode of chest pain yesterday and none last night.   Filed Vitals:   03/12/15 2036 03/13/15 0505 03/13/15 0803 03/13/15 1119  BP: 124/70 115/70  123/63  Pulse: 76 68  70  Temp: 98.3 F (36.8 C) 98.1 F (36.7 C)  98 F (36.7 C)  TempSrc: Oral Oral  Oral  Resp: 20 20  18   Height:      Weight:  74.753 kg (164 lb 12.8 oz)    SpO2: 95% 96% 96% 95%    Intake/Output Summary (Last 24 hours) at 03/13/15 1126 Last data filed at 03/13/15 0934  Gross per 24 hour  Intake  826.2 ml  Output   1600 ml  Net -773.8 ml    LABS: Basic Metabolic Panel:  Recent Labs  03/11/15 2003 03/12/15 0423  NA 136 138  K 4.0 3.7  CL 102 105  CO2 25 25  GLUCOSE 118* 159*  BUN 15 12  CREATININE 0.78 0.62  CALCIUM 8.7* 8.6*   Liver Function Tests: No results for input(s): AST, ALT, ALKPHOS, BILITOT, PROT, ALBUMIN in the last 72 hours. No results for input(s): LIPASE, AMYLASE in the last 72 hours. CBC:  Recent Labs  03/11/15 2003 03/12/15 0423  WBC 6.4 4.6  HGB 10.8* 10.6*  HCT 32.7* 31.7*  MCV 90.4 89.7  PLT 226 228   Cardiac Enzymes:  Recent Labs  03/12/15 0423 03/12/15 0851 03/12/15 1554  TROPONINI 0.06* 0.05* 0.04*   BNP: Invalid input(s): POCBNP D-Dimer: No results for input(s): DDIMER in the last 72 hours. Hemoglobin A1C: No results for input(s): HGBA1C in the last 72 hours. Fasting Lipid Panel: No results for input(s): CHOL, HDL, LDLCALC, TRIG, CHOLHDL, LDLDIRECT in the last 72 hours. Thyroid Function Tests: No results for input(s): TSH, T4TOTAL, T3FREE, THYROIDAB in the last 72 hours.  Invalid input(s): FREET3 Anemia Panel: No results for input(s): VITAMINB12, FOLATE, FERRITIN, TIBC, IRON, RETICCTPCT in the last 72 hours.   PHYSICAL EXAM General: Well developed, well nourished, in no acute distress HEENT:  Normocephalic and atramatic Neck:  No JVD.  Lungs: Clear bilaterally to auscultation  and percussion. Heart: HRRR . Normal S1 and S2 without gallops or murmurs.  Abdomen: Bowel sounds are positive, abdomen soft and non-tender  Msk:  Back normal, normal gait. Normal strength and tone for age. Extremities: No clubbing, cyanosis or edema.   Neuro: Alert and oriented X 3. Psych:  Good affect, responds appropriately  TELEMETRY: Monitor shows normal sinus rhythm 70 bpm.  ASSESSMENT AND PLAN: Non-STEMI/acute coronary syndrome. Patient has intermittent chest pain associated with shortness of breath and diaphoresis. Chest pains are occurring at rest. Patient is scheduled for cardiac catheterization tomorrow.  Principal Problem:   Chest pain at rest Active Problems:   COPD (chronic obstructive pulmonary disease)   Elevated troponin    Genesys Coggeshall A, MD, [facility] 03/13/2015 11:26 AM

## 2015-03-13 NOTE — Progress Notes (Signed)
Pt. Rested well during the night. Fluids started this A.M. For procedure on Monday. No acute distress observed. No c/o pain. No SOB noted. Will continue to monitor pt.

## 2015-03-13 NOTE — Progress Notes (Signed)
[place] at [place] NAME: Margaret Hendricks    MR#:  [id number]  DATE OF BIRTH:  1956-07-06  SUBJECTIVE:Admitted for copd exacerbation.less wheezing,cough bettertoday.slightly elevated troponin but no  Chest pain.scheduled for cardiac cath am,.denies any cough or sob.  CHIEF COMPLAINT:   Chief Complaint  Patient presents with  . Shortness of Breath    REVIEW OF SYSTEMS:    Review of Systems  Constitutional: Negative for fever and chills.  HENT: Negative for hearing loss.   Eyes: Negative for blurred vision, double vision and photophobia.  Respiratory: Negative for cough, hemoptysis and shortness of breath.   Cardiovascular: Negative for palpitations, orthopnea and leg swelling.  Gastrointestinal: Negative for vomiting, abdominal pain and diarrhea.  Genitourinary: Negative for dysuria and urgency.  Musculoskeletal: Negative for myalgias and neck pain.  Skin: Negative for rash.  Neurological: Negative for dizziness, focal weakness, seizures, weakness and headaches.  Psychiatric/Behavioral: Negative for memory loss. The patient does not have insomnia.     Nutrition:  Tolerating Diet: Tolerating PT:      DRUG ALLERGIES:  No Known Allergies  VITALS:  Blood pressure 115/70, pulse 68, temperature 98.1 F (36.7 C), temperature source Oral, resp. rate 20, height 5\' 3"  (1.6 m), weight 74.753 kg (164 lb 12.8 oz), SpO2 96 %.  PHYSICAL EXAMINATION:   Physical Exam  GENERAL:  59 y.o.-year-old patient lying in the bed with no acute distress.  EYES: Pupils equal, round, reactive to light and accommodation. No scleral icterus. Extraocular muscles intact.  HEENT: Head atraumatic, normocephalic. Oropharynx and nasopharynx clear.  NECK:  Supple, no jugular venous distention. No thyroid enlargement, no tenderness.  LUNGS: bilaterally clear with occasional expiratory wheezing. CARDIOVASCULAR: S1, S2 normal. No murmurs, rubs, or  gallops.  ABDOMEN: Soft, nontender, nondistended. Bowel sounds present. No organomegaly or mass.  EXTREMITIES: No pedal edema, cyanosis, or clubbing.  NEUROLOGIC: Cranial nerves II through XII are intact. Muscle strength 5/5 in all extremities. Sensation intact. Gait not checked.  PSYCHIATRIC: The patient is alert and oriented x 3.  SKIN: No obvious rash, lesion, or ulcer.    LABORATORY PANEL:   CBC  Recent Labs Lab 03/12/15 0423  WBC 4.6  HGB 10.6*  HCT 31.7*  PLT 228   ------------------------------------------------------------------------------------------------------------------  Chemistries   Recent Labs Lab 03/12/15 0423  NA 138  K 3.7  CL 105  CO2 25  GLUCOSE 159*  BUN 12  CREATININE 0.62  CALCIUM 8.6*   ------------------------------------------------------------------------------------------------------------------  Cardiac Enzymes  Recent Labs Lab 03/12/15 1554  TROPONINI 0.04*   ------------------------------------------------------------------------------------------------------------------  RADIOLOGY:  Dg Chest 2 View  03/11/2015   CLINICAL DATA:  Acute onset of shortness of breath and chest pain. Initial encounter.  EXAM: CHEST  2 VIEW  COMPARISON:  Chest radiograph performed 12/06/2014  FINDINGS: The lungs are well-aerated. Mildly increased interstitial markings are again noted, without definite focal consolidation or pulmonary edema. There is no evidence of pleural effusion or pneumothorax.  A 1.2 cm nodule overlying the right midlung zone has not changed significantly in size from the prior study, but CT of the chest is recommended for further evaluation, on an elective nonemergent basis.  The heart is mildly enlarged. No acute osseous abnormalities are seen.  IMPRESSION: 1. 1.2 cm nodule overlying the right midlung zone has not changed significantly in size from February, but CT of the chest is recommended for further evaluation, on an elective  nonemergent basis. 2. Mildly increased interstitial markings again noted; this  may be related to the patient's history of smoking. No focal consolidation or pulmonary edema seen.   Electronically Signed   By: Garald Balding M.D.   On: 03/11/2015 19:42     ASSESSMENT AND PLAN:  #47;59 year old female patient with COPD to admission. Continue her nebulizers, Pulmicort nebulizer, Mucinex for cough. #2: ACS/NSTEMI;;continue asa,nitrates,cardiac  Cath am . #3'continue DVT prophylaxis with Lovenox     All the records are reviewed and case discussed with Care Management/Social Workerr. Management plans discussed with the patient, family and they are in agreement.  CODE STATUS:full code  TOTAL TIME TAKING CARE OF THIS PATIENT: 30 minutes.   POSSIBLE D/C IN 1-2 DAYS, DEPENDING ON CLINICAL CONDITION.   Epifanio Lesches M.D on 03/13/2015 at 9:41 AM  Between 7am to 6pm - Pager - [phone]  After 6pm go to [email or web address] - password [email or web address] [facility]  [place] [facility]  Office  [phone]  CC: Primary care physician; Willa Frater, MD

## 2015-03-14 ENCOUNTER — Encounter
Admission: EM | Disposition: A | Discharge status: Home / Self Care | Payer: No Typology Code available for payment source | Source: Home / Self Care | Attending: Internal Medicine

## 2015-03-14 ENCOUNTER — Encounter: Payer: Self-pay | Admitting: Cardiovascular Disease

## 2015-03-14 HISTORY — PX: CARDIAC CATHETERIZATION: SHX172

## 2015-03-14 LAB — LIPID PANEL
Cholesterol: 126 mg/dL (ref 0–200)
HDL: 47 mg/dL (ref >40)
LDL CALC: 56 mg/dL (ref 0–99)
TRIGLYCERIDES: 114 mg/dL (ref <150)
Total CHOL/HDL Ratio: 2.7 RATIO
VLDL: 23 mg/dL (ref 0–40)

## 2015-03-14 LAB — PROTIME-INR
INR: 1.02
Prothrombin Time: 13.6 seconds (ref 11.4–15.0)

## 2015-03-14 SURGERY — LEFT HEART CATH
Anesthesia: Moderate Sedation | Laterality: Left

## 2015-03-14 MED ORDER — MIDAZOLAM HCL 2 MG/2ML IJ SOLN
INTRAMUSCULAR | Status: AC
  Filled 2015-03-14: qty 2

## 2015-03-14 MED ORDER — SODIUM CHLORIDE 0.9 % IJ SOLN: 3.0000 mL | Freq: Two times a day (BID) | INTRAMUSCULAR | Status: DC

## 2015-03-14 MED ORDER — MIDAZOLAM HCL 2 MG/2ML IJ SOLN
INTRAMUSCULAR | Status: DC | PRN
  Administered 2015-03-14: 1 mg via INTRAVENOUS

## 2015-03-14 MED ORDER — ASPIRIN 81 MG PO CHEW
81.0000 mg | CHEWABLE_TABLET | ORAL | Status: AC
  Administered 2015-03-15: 81 mg via ORAL
  Filled 2015-03-14: qty 1

## 2015-03-14 MED ORDER — CARVEDILOL 6.25 MG PO TABS
6.2500 mg | ORAL_TABLET | Freq: Two times a day (BID) | ORAL | Status: DC
Start: 2015-03-14 — End: 2015-03-16
  Administered 2015-03-14 – 2015-03-16 (×4): 6.25 mg via ORAL
  Filled 2015-03-14 (×5): qty 1

## 2015-03-14 MED ORDER — HEPARIN (PORCINE) IN NACL 2-0.9 UNIT/ML-% IJ SOLN
INTRAMUSCULAR | Status: AC
  Filled 2015-03-14: qty 500

## 2015-03-14 MED ORDER — SODIUM CHLORIDE 0.9 % WEIGHT BASED INFUSION: 1.0000 mL/kg/h | INTRAVENOUS | Status: DC

## 2015-03-14 MED ORDER — FENTANYL CITRATE (PF) 100 MCG/2ML IJ SOLN
INTRAMUSCULAR | Status: AC
  Filled 2015-03-14: qty 2

## 2015-03-14 MED ORDER — SODIUM CHLORIDE 0.9 % IV SOLN: 250.0000 mL | INTRAVENOUS | Status: DC | PRN

## 2015-03-14 MED ORDER — METHYLPREDNISOLONE SODIUM SUCC 125 MG IJ SOLR
60.0000 mg | INTRAMUSCULAR | Status: AC
  Administered 2015-03-14: 60 mg via INTRAVENOUS
  Filled 2015-03-14: qty 2

## 2015-03-14 MED ORDER — SPIRONOLACTONE 25 MG PO TABS
25.0000 mg | ORAL_TABLET | Freq: Every day | ORAL | Status: DC
  Administered 2015-03-14: 25 mg via ORAL
  Filled 2015-03-14: qty 1

## 2015-03-14 MED ORDER — SPIRONOLACTONE 25 MG PO TABS
25.0000 mg | ORAL_TABLET | Freq: Every day | ORAL | Status: DC
  Administered 2015-03-15 – 2015-03-16 (×2): 25 mg via ORAL
  Filled 2015-03-14 (×2): qty 1

## 2015-03-14 MED ORDER — SODIUM CHLORIDE 0.9 % IJ SOLN
3.0000 mL | INTRAMUSCULAR | Status: DC | PRN
Start: 2015-03-14 — End: 2015-03-14

## 2015-03-14 MED ORDER — SACUBITRIL-VALSARTAN 24-26 MG PO TABS
1.0000 | ORAL_TABLET | Freq: Two times a day (BID) | ORAL | Status: DC
  Administered 2015-03-14 – 2015-03-16 (×5): 1 via ORAL
  Filled 2015-03-14 (×6): qty 1

## 2015-03-14 MED ORDER — SODIUM CHLORIDE 0.9 % WEIGHT BASED INFUSION: 3.0000 mL/kg/h | INTRAVENOUS | Status: DC

## 2015-03-14 MED ORDER — SODIUM CHLORIDE 0.9 % IJ SOLN: 3.0000 mL | INTRAMUSCULAR | Status: DC | PRN

## 2015-03-14 MED ORDER — ONDANSETRON HCL 4 MG/2ML IJ SOLN: 4.0000 mg | Freq: Four times a day (QID) | INTRAMUSCULAR | Status: DC | PRN

## 2015-03-14 MED ORDER — FENTANYL CITRATE (PF) 100 MCG/2ML IJ SOLN
INTRAMUSCULAR | Status: DC | PRN
  Administered 2015-03-14: 50 ug via INTRAVENOUS

## 2015-03-14 MED ORDER — ACETAMINOPHEN 325 MG PO TABS: 650.0000 mg | ORAL_TABLET | ORAL | Status: DC | PRN

## 2015-03-14 SURGICAL SUPPLY — 10 items
CATH INFINITI 5 FR 3DRC (CATHETERS) ×3 IMPLANT
CATH INFINITI 5FR ANG PIGTAIL (CATHETERS) ×3 IMPLANT
CATH INFINITI 5FR JL4 (CATHETERS) ×3 IMPLANT
CATH INFINITI JR4 5F (CATHETERS) ×3 IMPLANT
DEVICE CLOSURE MYNXGRIP 5F (Vascular Products) ×3 IMPLANT
KIT MANI 3VAL PERCEP (MISCELLANEOUS) ×3 IMPLANT
NEEDLE PERC 18GX7CM (NEEDLE) ×3 IMPLANT
PACK CARDIAC CATH (CUSTOM PROCEDURE TRAY) ×3 IMPLANT
SHEATH PINNACLE 5F 10CM (SHEATH) ×3 IMPLANT
WIRE EMERALD 3MM-J .035X150CM (WIRE) ×3 IMPLANT

## 2015-03-14 NOTE — Care Management (Signed)
Informed by attending, cardiology is recommending a [place].  Attending defers completion of the referral order to cardiology.  Spoke with Dyann Ruddle the physician assistant of Dr Humphrey Rolls and faxed the referral, requesting that it be faxed back to this CM so it can be sent to [place].  Received confirmation that fax was completed.

## 2015-03-14 NOTE — Progress Notes (Signed)
[place] at [place] NAME: Margaret Hendricks    MR#:  [id number]  DATE OF BIRTH:  [birth year]  SUBJECTIVE: Cardiac catheter showed EF of 10%. Normal coronaries. Started on entresto.Coreg, spironolactone. Patient is LifeVest. Possible discharge tomorrow if everything is set up. She denies any complaints.   CHIEF COMPLAINT:   Chief Complaint  Patient presents with  . Shortness of Breath    REVIEW OF SYSTEMS:    Review of Systems  Constitutional: Negative for fever and chills.  HENT: Negative for hearing loss.   Eyes: Negative for blurred vision, double vision and photophobia.  Respiratory: Negative for cough, hemoptysis and shortness of breath.   Cardiovascular: Negative for palpitations, orthopnea and leg swelling.  Gastrointestinal: Negative for vomiting, abdominal pain and diarrhea.  Genitourinary: Negative for dysuria and urgency.  Musculoskeletal: Negative for myalgias and neck pain.  Skin: Negative for rash.  Neurological: Negative for dizziness, focal weakness, seizures, weakness and headaches.  Psychiatric/Behavioral: Negative for memory loss. The patient does not have insomnia.     Nutrition:  Tolerating Diet: Tolerating PT:      DRUG ALLERGIES:  No Known Allergies  VITALS:  Blood pressure 120/80, pulse 60, temperature 97.7 F (36.5 C), temperature source Oral, resp. rate 18, height 5\' 3"  (1.6 m), weight 76.658 kg (169 lb), SpO2 96 %.  PHYSICAL EXAMINATION:   Physical Exam  GENERAL:  59 y.o.-year-old patient lying in the bed with no acute distress.  EYES: Pupils equal, round, reactive to light and accommodation. No scleral icterus. Extraocular muscles intact.  HEENT: Head atraumatic, normocephalic. Oropharynx and nasopharynx clear.  NECK:  Supple, no jugular venous distention. No thyroid enlargement, no tenderness.  LUNGS: bilaterally clear with occasional expiratory wheezing. CARDIOVASCULAR: S1, S2 normal.  No murmurs, rubs, or gallops.  ABDOMEN: Soft, nontender, nondistended. Bowel sounds present. No organomegaly or mass.  EXTREMITIES: No pedal edema, cyanosis, or clubbing.  NEUROLOGIC: Cranial nerves II through XII are intact. Muscle strength 5/5 in all extremities. Sensation intact. Gait not checked.  PSYCHIATRIC: The patient is alert and oriented x 3.  SKIN: No obvious rash, lesion, or ulcer.    LABORATORY PANEL:   CBC  Recent Labs Lab 03/12/15 0423  WBC 4.6  HGB 10.6*  HCT 31.7*  PLT 228   ------------------------------------------------------------------------------------------------------------------  Chemistries   Recent Labs Lab 03/12/15 0423  NA 138  K 3.7  CL 105  CO2 25  GLUCOSE 159*  BUN 12  CREATININE 0.62  CALCIUM 8.6*   ------------------------------------------------------------------------------------------------------------------  Cardiac Enzymes  Recent Labs Lab 03/12/15 1554  TROPONINI 0.04*   ------------------------------------------------------------------------------------------------------------------  RADIOLOGY:  No results found.   ASSESSMENT AND PLAN:  #15;59 year old female patient with COPD to admission. Continue nebulizer, Pulmicort,. #2: ACS/NSTEMI;; dilated cardiomyopathy with EF of 10%. Started on Crestor, Coreg, Aldactone. Patient is LifeVest., This close follow-up with cardiology. Monitor on telemetry for arrhythmia development overnight. Possible discharge tomorrow. . #3'continue DVT prophylaxis with Lovenox     All the records are reviewed and case discussed with Care Management/Social Workerr. Management plans discussed with the patient, family and they are in agreement.  CODE STATUS:full code  TOTAL TIME TAKING CARE OF THIS PATIENT: 30 minutes.   POSSIBLE D/C IN 1-2 DAYS, DEPENDING ON CLINICAL CONDITION.   Epifanio Lesches M.D on 03/14/2015 at 9:47 AM  Between 7am to 6pm - Pager - [phone]  After 6pm  go to [email or web address] - password [email or web address] [facility]  [place] [facility]  Office  [phone]  CC:  Primary care physician; Willa Frater, MD

## 2015-03-14 NOTE — Progress Notes (Signed)
SUBJECTIVE: feeling better   Filed Vitals:   03/14/15 0421 03/14/15 0717 03/14/15 0817 03/14/15 0841  BP:  123/87  116/73  Pulse:  60  61  Temp:  97.7 F (36.5 C)    TempSrc:  Oral    Resp:  16  15  Height:  5\' 3"  (1.6 m)    Weight: 76.885 kg (169 lb 8 oz) 76.658 kg (169 lb)    SpO2:  94% 2% 97%    Intake/Output Summary (Last 24 hours) at 03/14/15 0857 Last data filed at 03/14/15 0600  Gross per 24 hour  Intake   1065 ml  Output   1750 ml  Net   -685 ml    LABS: Basic Metabolic Panel:  Recent Labs  03/11/15 2003 03/12/15 0423  NA 136 138  K 4.0 3.7  CL 102 105  CO2 25 25  GLUCOSE 118* 159*  BUN 15 12  CREATININE 0.78 0.62  CALCIUM 8.7* 8.6*   Liver Function Tests: No results for input(s): AST, ALT, ALKPHOS, BILITOT, PROT, ALBUMIN in the last 72 hours. No results for input(s): LIPASE, AMYLASE in the last 72 hours. CBC:  Recent Labs  03/11/15 2003 03/12/15 0423  WBC 6.4 4.6  HGB 10.8* 10.6*  HCT 32.7* 31.7*  MCV 90.4 89.7  PLT 226 228   Cardiac Enzymes:  Recent Labs  03/12/15 0423 03/12/15 0851 03/12/15 1554  TROPONINI 0.06* 0.05* 0.04*   BNP: Invalid input(s): POCBNP D-Dimer: No results for input(s): DDIMER in the last 72 hours. Hemoglobin A1C: No results for input(s): HGBA1C in the last 72 hours. Fasting Lipid Panel:  Recent Labs  03/14/15 0357  CHOL 126  HDL 47  LDLCALC 56  TRIG 114  CHOLHDL 2.7   Thyroid Function Tests: No results for input(s): TSH, T4TOTAL, T3FREE, THYROIDAB in the last 72 hours.  Invalid input(s): FREET3 Anemia Panel: No results for input(s): VITAMINB12, FOLATE, FERRITIN, TIBC, IRON, RETICCTPCT in the last 72 hours.   PHYSICAL EXAM General: Well developed, well nourished, in no acute distress HEENT:  Normocephalic and atramatic Neck:  No JVD.  Lungs: Clear bilaterally to auscultation and percussion. Heart: HRRR . Normal S1 and S2 without gallops or murmurs.  Abdomen: Bowel sounds are positive, abdomen  soft and non-tender  Msk:  Back normal, normal gait. Normal strength and tone for age. Extremities: No clubbing, cyanosis or edema.   Neuro: Alert and oriented X 3. Psych:  Good affect, responds appropriately  TELEMETRY: sinus rhythm  ASSESSMENT AND PLAN: LVEF on echo and cath showed severe LV dysfunction with LVEF of 10 % and severely dilated LV. Normal coronaries. Advise Lifevest and coreg, entresto, ARB/ace inhibitors and spironolactone.    Principal Problem:   Chest pain at rest Active Problems:   COPD (chronic obstructive pulmonary disease)   Elevated troponin    Neoma Laming A, MD, [facility] 03/14/2015 8:57 AM

## 2015-03-14 NOTE — Progress Notes (Signed)
Pt. Rested well during the night. Fluids continue.No acute distress observed. No c/o pain. No SOB noted. Will continue to monitor pt. bilateral groin prepped for A.M procedure.  Fall bundle in place, yellow socks, yellow  bracelet and fall risk signaged and toileting offered on hourly rounding.

## 2015-03-15 DIAGNOSIS — Z7951 Long term (current) use of inhaled steroids: Secondary | ICD-10-CM | POA: Diagnosis not present

## 2015-03-15 DIAGNOSIS — I214 Non-ST elevation (NSTEMI) myocardial infarction: Secondary | ICD-10-CM | POA: Diagnosis present

## 2015-03-15 DIAGNOSIS — Z87891 Personal history of nicotine dependence: Secondary | ICD-10-CM | POA: Diagnosis not present

## 2015-03-15 DIAGNOSIS — I447 Left bundle-branch block, unspecified: Secondary | ICD-10-CM | POA: Diagnosis present

## 2015-03-15 DIAGNOSIS — J441 Chronic obstructive pulmonary disease with (acute) exacerbation: Secondary | ICD-10-CM | POA: Diagnosis present

## 2015-03-15 DIAGNOSIS — Z791 Long term (current) use of non-steroidal anti-inflammatories (NSAID): Secondary | ICD-10-CM | POA: Diagnosis not present

## 2015-03-15 DIAGNOSIS — I209 Angina pectoris, unspecified: Secondary | ICD-10-CM | POA: Diagnosis present

## 2015-03-15 LAB — BASIC METABOLIC PANEL
Anion gap: 8 (ref 5–15)
BUN: 15 mg/dL (ref 6–20)
CHLORIDE: 106 mmol/L (ref 101–111)
CO2: 25 mmol/L (ref 22–32)
Calcium: 8.9 mg/dL (ref 8.9–10.3)
Creatinine, Ser: 0.59 mg/dL (ref 0.44–1.00)
GFR calc non Af Amer: >60 mL/min (ref >60)
Glucose, Bld: 132 mg/dL — ABNORMAL HIGH (ref 65–99)
POTASSIUM: 4.3 mmol/L (ref 3.5–5.1)
Sodium: 139 mmol/L (ref 135–145)

## 2015-03-15 NOTE — Progress Notes (Signed)
Margaret Hendricks will not accept script (medical order form) from cardiologist because they state they form was not complete. They are requiring it be completed again. Spoke with Margaret Hendricks at [facility] [phone]) multiple times through out the day.  He states he has attempted to get in touch with cardiologist and was told the form would be completed again and refaxed. As of 3:10 pm, Margaret Hendricks states that the [name] has still not received the (medical order form) and he would personally attempt to speak with MD again. CM paged Dr. Chancy Milroy.

## 2015-03-15 NOTE — Progress Notes (Signed)
Pt had an 18 beat run of v-tach, pt sleeping sounding, when pt awoke, she reported no pain or discomfort. MD- Dr. Reece Levy made aware.  MD advised to monitor closely.

## 2015-03-15 NOTE — Progress Notes (Signed)
  SUBJECTIVE: Patient is feeling much better she denies any chest pain or shortness of breath.   Filed Vitals:   03/14/15 2058 03/15/15 0419 03/15/15 0757 03/15/15 0802  BP:  112/56  117/101  Pulse:  63  106  Temp:  97.6 F (36.4 C)  97.6 F (36.4 C)  TempSrc:  Oral  Oral  Resp:  16  18  Height:      Weight:  74.617 kg (164 lb 8 oz)    SpO2: 95% 94% 95% 94%    Intake/Output Summary (Last 24 hours) at 03/15/15 0838 Last data filed at 03/15/15 0422  Gross per 24 hour  Intake    363 ml  Output   1800 ml  Net  -1437 ml    LABS: Basic Metabolic Panel:  Recent Labs  03/15/15 0455  NA 139  K 4.3  CL 106  CO2 25  GLUCOSE 132*  BUN 15  CREATININE 0.59  CALCIUM 8.9   Liver Function Tests: No results for input(s): AST, ALT, ALKPHOS, BILITOT, PROT, ALBUMIN in the last 72 hours. No results for input(s): LIPASE, AMYLASE in the last 72 hours. CBC: No results for input(s): WBC, NEUTROABS, HGB, HCT, MCV, PLT in the last 72 hours. Cardiac Enzymes:  Recent Labs  03/12/15 0851 03/12/15 1554  TROPONINI 0.05* 0.04*   BNP: Invalid input(s): POCBNP D-Dimer: No results for input(s): DDIMER in the last 72 hours. Hemoglobin A1C: No results for input(s): HGBA1C in the last 72 hours. Fasting Lipid Panel:  Recent Labs  03/14/15 0357  CHOL 126  HDL 47  LDLCALC 56  TRIG 114  CHOLHDL 2.7   Thyroid Function Tests: No results for input(s): TSH, T4TOTAL, T3FREE, THYROIDAB in the last 72 hours.  Invalid input(s): FREET3 Anemia Panel: No results for input(s): VITAMINB12, FOLATE, FERRITIN, TIBC, IRON, RETICCTPCT in the last 72 hours.   PHYSICAL EXAM General: Well developed, well nourished, in no acute distress HEENT:  Normocephalic and atramatic Neck:  No JVD.  Lungs: Clear bilaterally to auscultation and percussion. Heart: HRRR . Normal S1 and S2 without gallops or murmurs.  Abdomen: Bowel sounds are positive, abdomen soft and non-tender  Msk:  Back normal, normal gait.  Normal strength and tone for age. Extremities: No clubbing, cyanosis or edema.   Neuro: Alert and oriented X 3. Psych:  Good affect, responds appropriately  TELEMETRY: Monitor shows sinus rhythm bundle-branch block. She has left bundle branch block  ASSESSMENT AND PLAN: Idiopathic cardiomyopathy with left ventricle systolic function, 4-70%. There is severely dilated left ventricle with moderate mitral regurgitation. Patient had nonsustained ventricular tachycardia on monitor last night. Patient however is feeling okay. Patient was started on heart failure medication last night. She is tolerating those medications very well. Cardiac catheterization showed normal coronaries with left radical ejection fraction 7%. Advise lifevest before being discharged.  Principal Problem:   Chest pain at rest Active Problems:   COPD (chronic obstructive pulmonary disease)   Elevated troponin    Neoma Laming A, MD, [facility] 03/15/2015 8:38 AM

## 2015-03-15 NOTE — Progress Notes (Signed)
[place] at [place] NAME: Margaret Hendricks    MR#:  [id number]  DATE OF BIRTH:  10/18/1956  SUBJECTIVE: Cardiac catheter showed EF of 10%. Normal coronaries. Started on entresto.Coreg, spironolactone. Patient   waitingfor LifeVest placement. Had episode of  V. Tach last night. Patient wasn't symptomatic. Today morning she denies any shortness of breath, cough, chest pain. Patient feels good.  CHIEF COMPLAINT:   Chief Complaint  Patient presents with  . Shortness of Breath    REVIEW OF SYSTEMS:    Review of Systems  Constitutional: Negative for fever and chills.  HENT: Negative for hearing loss.   Eyes: Negative for blurred vision, double vision and photophobia.  Respiratory: Negative for cough, hemoptysis and shortness of breath.   Cardiovascular: Negative for palpitations, orthopnea and leg swelling.  Gastrointestinal: Negative for vomiting, abdominal pain and diarrhea.  Genitourinary: Negative for dysuria and urgency.  Musculoskeletal: Negative for myalgias and neck pain.  Skin: Negative for rash.  Neurological: Negative for dizziness, focal weakness, seizures, weakness and headaches.  Psychiatric/Behavioral: Negative for memory loss. The patient does not have insomnia.     Nutrition:  Tolerating Diet: Tolerating PT:      DRUG ALLERGIES:  No Known Allergies  VITALS:  Blood pressure 117/101, pulse 106, temperature 97.6 F (36.4 C), temperature source Oral, resp. rate 18, height 5\' 3"  (1.6 m), weight 74.617 kg (164 lb 8 oz), SpO2 94 %.  PHYSICAL EXAMINATION:   Physical Exam  GENERAL:  59 y.o.-year-old patient lying in the bed with no acute distress.  EYES: Pupils equal, round, reactive to light and accommodation. No scleral icterus. Extraocular muscles intact.  HEENT: Head atraumatic, normocephalic. Oropharynx and nasopharynx clear.  NECK:  Supple, no jugular venous distention. No thyroid enlargement, no  tenderness.  LUNGS: bilaterally clear with occasional expiratory wheezing. CARDIOVASCULAR: S1, S2 normal. No murmurs, rubs, or gallops.  ABDOMEN: Soft, nontender, nondistended. Bowel sounds present. No organomegaly or mass.  EXTREMITIES: No pedal edema, cyanosis, or clubbing.  NEUROLOGIC: Cranial nerves II through XII are intact. Muscle strength 5/5 in all extremities. Sensation intact. Gait not checked.  PSYCHIATRIC: The patient is alert and oriented x 3.  SKIN: No obvious rash, lesion, or ulcer.    LABORATORY PANEL:   CBC  Recent Labs Lab 03/12/15 0423  WBC 4.6  HGB 10.6*  HCT 31.7*  PLT 228   ------------------------------------------------------------------------------------------------------------------  Chemistries   Recent Labs Lab 03/15/15 0455  NA 139  K 4.3  CL 106  CO2 25  GLUCOSE 132*  BUN 15  CREATININE 0.59  CALCIUM 8.9   ------------------------------------------------------------------------------------------------------------------  Cardiac Enzymes  Recent Labs Lab 03/12/15 1554  TROPONINI 0.04*   ------------------------------------------------------------------------------------------------------------------  RADIOLOGY:  No results found.   ASSESSMENT AND PLAN:  #23;59 year old female patient with COPD to admission. Continue nebulizer, Pulmicort,.improved. #2: ACS/NSTEMI;; dilated cardiomyopathy with EF of 10%. Started on entresto , Coreg, Aldactone.  Is LifeVest placement before discharge. Discussed the plan with patient and patient's family. Patient will see Dr. Laurelyn Sickle   in the clinic next week For cardiology follow-up. . #3'continue DVT prophylaxis with Lovenox     All the records are reviewed and case discussed with Care Management/Social Workerr. Management plans discussed with the patient, family and they are in agreement.  CODE STATUS:full code  TOTAL TIME TAKING CARE OF THIS PATIENT: 30 minutes.   POSSIBLE D/C IN 1-2 DAYS,  DEPENDING ON CLINICAL CONDITION.   Epifanio Lesches M.D on 03/15/2015 at 11:26 AM  Between 7am to 6pm - Pager - [phone]  After 6pm go to [email or web address] - password [email or web address] [facility]  [place] [facility]  Office  [phone]  CC: Primary care physician; Willa Frater, MD

## 2015-03-15 NOTE — Progress Notes (Signed)
Spoke with Dyann Ruddle and Dr. Humphrey Rolls who both state they are faxing the medical order form to [phone] right now. TC to [facility] and updated. Requested he call CM back with update ASAP. He states he feels he can have the zoll delivered within the next 3 hours.

## 2015-03-15 NOTE — Progress Notes (Signed)
Pt's IV site infiltrated, MD paged (Dr. Ether Griffins), states okay to leave IV out. Conley Simmonds, RN

## 2015-03-15 NOTE — Care Management Note (Signed)
Faxed [employer] prescription and needed paperwork to [place] @ [phone]. Fax confirmation received. Documentation to [employer] that life vest is needed prior to discharge today.

## 2015-03-16 MED ORDER — SPIRONOLACTONE 25 MG PO TABS: 25.0000 mg | ORAL_TABLET | Freq: Every day | ORAL | Status: DC

## 2015-03-16 MED ORDER — CARVEDILOL 6.25 MG PO TABS: 6.2500 mg | ORAL_TABLET | Freq: Two times a day (BID) | ORAL | Status: DC

## 2015-03-16 MED ORDER — SACUBITRIL-VALSARTAN 24-26 MG PO TABS: 1.0000 | ORAL_TABLET | Freq: Two times a day (BID) | ORAL | Status: DC

## 2015-03-16 MED ORDER — IPRATROPIUM-ALBUTEROL 20-100 MCG/ACT IN AERS: 1.0000 | INHALATION_SPRAY | Freq: Four times a day (QID) | RESPIRATORY_TRACT | Status: DC

## 2015-03-16 NOTE — Progress Notes (Signed)
Patient given discharge teaching and paperwork regarding medications, diet, follow-up appointments and activity. Patient understanding verbalized. No complaints at this time. IV and telemetry discontinued. Prescriptions electronically submitted by Dr. Vianne Bulls.  Skin assessment as previously charted and vitals are stable. Patient being discharged to home. Caregiver/family present during discharge teaching. LifeVest fitted/education given by representative at bedside prior to discharge.  Toniann Ket

## 2015-03-16 NOTE — Progress Notes (Signed)
MD notified that patient has been fitted for LifeVest. Per Dr. Vianne Bulls, discharge patient home  - instructions already in  [facility] and pt already has follow-up scheduled with Dr. Humphrey Rolls. No further needs from CM/CSW. Toniann Ket

## 2015-03-16 NOTE — Progress Notes (Signed)
   03/16/15 1141  Clinical Encounter Type  Visited With Patient and family together  Visit Type Follow-up;Other (Comment)  Referral From Nurse  Consult/Referral To Chaplain  Spiritual Encounters  Spiritual Needs Literature;Brochure;Other (Comment)  Advance Directives (For Healthcare)  Would patient like information on creating an advanced directive? Yes - Educational materials given  Chaplain was called because patient was ready to be discharged but wanted the information for an AD. Gave a copy of the AD packet and asked the patient to complete as applicable. Also informed her that if she was not ready by the time she was discharged she could complete and get notarized at her banking institution and bring a copy to the hospital as applicable. Chaplain Christyn Gutkowski A. Byard Carranza Ext. [phone]

## 2015-03-16 NOTE — Discharge Summary (Signed)
Margaret Hendricks, is a 59 y.o. female  DOB July 18, 1956  MRN [id number].  Admission date:  03/11/2015  Admitting Physician  Lance Coon, MD  Discharge Date:  03/16/2015   Primary MD  Willa Frater, MD  Recommendations for primary care physician for things to follow:  Follow up with DR.Shukat khan in 3-5 days   Admission Diagnosis  COPD with acute exacerbation [J44.1] Non-ST elevation MI (NSTEMI) [I21.4] Ischemic chest pain [I20.9]   Discharge Diagnosis  COPD with acute exacerbation [J44.1] Non-ST elevation MI (NSTEMI) [I21.4] Ischemic chest pain [I20.9]   Principal Problem:   Chest pain at rest Active Problems:   COPD (chronic obstructive pulmonary disease)   Elevated troponin   Chest pain      Past Medical History  Diagnosis Date  . COPD (chronic obstructive pulmonary disease)     Past Surgical History  Procedure Laterality Date  . Appendectomy    . Tubal ligation Bilateral   . Cardiac catheterization Left 03/14/2015    Procedure: Left Heart Cath;  Surgeon: Dionisio David, MD;  Location: [facility];  Service: Cardiovascular;  Laterality: Left;       History of present illness and  Hospital Course:     Kindly see H&P for history of present illness and admission details, please review complete Labs, Consult reports and Test reports for all details in brief  HPI  from the history and physical done on the day of admission    Hospital Course   59 yr old female admitted for copd exacerbation,started on solumedrol,duonebs.symptoms improved.but her tropon is are slightly elevated,seen by cardio due to h/o exertional dyspnoea..taken to cardiac cath on 516;it showed EF 10% with normal coronaries,started on entresto,metoprolol,spironolactone.life vest also placed before discharge.pt tolerating new  med's,stable for  Discharge.follow up with Dr.Khan.advised to use Lifevest  All the time to recognize v tach. Discussed about her diagnosis of new dilated cardiomypathy with severely decreased LV function and high risk for cardiac  Arrest.  Discharge Condition:stable   Follow UP;Dr.Shukat Humphrey Rolls  On may 23rd.      Discharge Instructions  and  Discharge Medications       Medication List    STOP taking these medications        dextromethorphan-guaiFENesin 30-600 MG per 12 hr tablet  Commonly known as:  [place] DM      TAKE these medications        albuterol 108 (90 BASE) MCG/ACT inhaler  Commonly known as:  PROVENTIL HFA;VENTOLIN HFA  Inhale 2 puffs into the lungs every 6 (six) hours as needed for wheezing.     buPROPion 150 MG 12 hr tablet  Commonly known as:  ZYBAN  Take 150 mg by mouth daily.     carvedilol 6.25 MG tablet  Commonly known as:  COREG  Take 1 tablet (6.25 mg total) by mouth 2 (two) times daily with a meal.     fluticasone 220 MCG/ACT inhaler  Commonly known as:  FLOVENT HFA  Inhale 1 puff into the lungs 2 (two) times daily.     ibuprofen 200 MG tablet  Commonly known as:  ADVIL,MOTRIN  Take 600 mg by mouth every 6 (six) hours as needed for mild pain.     Ipratropium-Albuterol 20-100 MCG/ACT Aers respimat  Commonly known as:  COMBIVENT  Inhale 1 puff into the lungs 4 (four) times daily.     multivitamin with minerals Tabs tablet  Take 1 tablet by mouth daily.  sacubitril-valsartan 24-26 MG  Commonly known as:  ENTRESTO  Take 1 tablet by mouth 2 (two) times daily.     spironolactone 25 MG tablet  Commonly known as:  ALDACTONE  Take 1 tablet (25 mg total) by mouth daily.          Diet and Activity recommendation: See Discharge Instructions above   Consults obtained -cardio   Major procedures and Radiology Reports - PLEASE review detailed and final reports for all details, in brief -     Dg Chest 2 View  03/11/2015   CLINICAL  DATA:  Acute onset of shortness of breath and chest pain. Initial encounter.  EXAM: CHEST  2 VIEW  COMPARISON:  Chest radiograph performed 12/06/2014  FINDINGS: The lungs are well-aerated. Mildly increased interstitial markings are again noted, without definite focal consolidation or pulmonary edema. There is no evidence of pleural effusion or pneumothorax.  A 1.2 cm nodule overlying the right midlung zone has not changed significantly in size from the prior study, but CT of the chest is recommended for further evaluation, on an elective nonemergent basis.  The heart is mildly enlarged. No acute osseous abnormalities are seen.  IMPRESSION: 1. 1.2 cm nodule overlying the right midlung zone has not changed significantly in size from February, but CT of the chest is recommended for further evaluation, on an elective nonemergent basis. 2. Mildly increased interstitial markings again noted; this may be related to the patient's history of smoking. No focal consolidation or pulmonary edema seen.   Electronically Signed   By: Garald Balding M.D.   On: 03/11/2015 19:42    Micro Results  No results found for this or any previous visit (from the past 240 hour(s)).     Today   Subjective:   Margaret Hendricks today has no headache,no chest abdominal pain,no new weakness tingling or numbness, feels much better wants to go home today.   Objective:   Blood pressure 106/67, pulse 75, temperature 97.7 F (36.5 C), temperature source Oral, resp. rate 17, height 5\' 3"  (1.6 m), weight 77.021 kg (169 lb 12.8 oz), SpO2 97 %.   Intake/Output Summary (Last 24 hours) at 03/16/15 1550 Last data filed at 03/16/15 1200  Gross per 24 hour  Intake    480 ml  Output    700 ml  Net   -220 ml    Exam Awake Alert, Oriented x 3, No new F.N deficits, Normal affect [place].AT,PERRAL Supple Neck,No JVD, No cervical lymphadenopathy appriciated.  Symmetrical Chest wall movement, Good air movement bilaterally, CTAB RRR,No  Gallops,Rubs or new Murmurs, No Parasternal Heave +ve B.Sounds, Abd Soft, Non tender, No organomegaly appriciated, No rebound -guarding or rigidity. No Cyanosis, Clubbing or edema, No new Rash or bruise  Data Review   CBC w Diff: Lab Results  Component Value Date   WBC 4.6 03/12/2015   HGB 10.6* 03/12/2015   HCT 31.7* 03/12/2015   PLT 228 03/12/2015    CMP: Lab Results  Component Value Date   NA 139 03/15/2015   K 4.3 03/15/2015   CL 106 03/15/2015   CO2 25 03/15/2015   BUN 15 03/15/2015   CREATININE 0.59 03/15/2015  .   Total Time in preparing paper work, data evaluation and todays exam - 77 minutes  Kairo Laubacher M.D on 03/16/2015 at 3:50 PM

## 2015-03-16 NOTE — Progress Notes (Signed)
   SUBJECTIVE: LifeVest to be placed today. Patient feeling fine, no chest pain or shortness of breath.   Filed Vitals:   03/16/15 0404 03/16/15 0456 03/16/15 0832 03/16/15 0908  BP: 115/67   125/68  Pulse: 75     Temp: 97.9 F (36.6 C)     TempSrc: Oral     Resp: 20     Height:      Weight:  77.021 kg (169 lb 12.8 oz)    SpO2: 97%  97%     Intake/Output Summary (Last 24 hours) at 03/16/15 0956 Last data filed at 03/16/15 0456  Gross per 24 hour  Intake    360 ml  Output    700 ml  Net   -340 ml    LABS: Basic Metabolic Panel:  Recent Labs  03/15/15 0455  NA 139  K 4.3  CL 106  CO2 25  GLUCOSE 132*  BUN 15  CREATININE 0.59  CALCIUM 8.9   Liver Function Tests: No results for input(s): AST, ALT, ALKPHOS, BILITOT, PROT, ALBUMIN in the last 72 hours. No results for input(s): LIPASE, AMYLASE in the last 72 hours. CBC: No results for input(s): WBC, NEUTROABS, HGB, HCT, MCV, PLT in the last 72 hours. Cardiac Enzymes: No results for input(s): CKTOTAL, CKMB, CKMBINDEX, TROPONINI in the last 72 hours. BNP: Invalid input(s): POCBNP D-Dimer: No results for input(s): DDIMER in the last 72 hours. Hemoglobin A1C: No results for input(s): HGBA1C in the last 72 hours. Fasting Lipid Panel:  Recent Labs  03/14/15 0357  CHOL 126  HDL 47  LDLCALC 56  TRIG 114  CHOLHDL 2.7   Thyroid Function Tests: No results for input(s): TSH, T4TOTAL, T3FREE, THYROIDAB in the last 72 hours.  Invalid input(s): FREET3 Anemia Panel: No results for input(s): VITAMINB12, FOLATE, FERRITIN, TIBC, IRON, RETICCTPCT in the last 72 hours.   PHYSICAL EXAM General: Well developed, well nourished, in no acute distress HEENT:  Normocephalic and atramatic Neck:  No JVD.  Lungs: Clear bilaterally to auscultation and percussion. Heart: HRRR . Normal S1 and S2 without gallops or murmurs.  Abdomen: Bowel sounds are positive, abdomen soft and non-tender  Msk:  Back normal, normal gait. Normal  strength and tone for age. Extremities: No clubbing, cyanosis or edema.   Neuro: Alert and oriented X 3. Psych:  Good affect, responds appropriately  TELEMETRY: Reviewed telemetry pt in normal sinus rhythm:  ASSESSMENT AND PLAN: Patient has systolic congestive heart failure, EF 10%, normal coronaries on cardiac cath, LifeVest to be placed today, patient to be discharged with current cardiac medications. With follow-up in the office Monday, May 23 at 2:30 PM.   Patient and plan discussed with supervising provider, Dr. Neoma Laming, who agrees with above findings.   Kelby Fam Parkin, [place]  03/16/2015 9:56 AM

## 2015-03-16 NOTE — Care Management (Signed)
Spoke with [facility] and informed the life vest is being shipped in this morning.  Margaret Hendricks will come to unit and fit patient.  No other needs.  Updated primary nurse and family

## 2016-03-15 ENCOUNTER — Encounter: Payer: Self-pay | Admitting: *Deleted

## 2016-03-15 ENCOUNTER — Encounter: Payer: BLUE CROSS/BLUE SHIELD | Attending: Cardiovascular Disease | Admitting: *Deleted

## 2016-03-15 VITALS — BP 110/60 | HR 74 | Ht 63.4 in | Wt 187.0 lb

## 2016-03-15 DIAGNOSIS — I5042 Chronic combined systolic (congestive) and diastolic (congestive) heart failure: Secondary | ICD-10-CM

## 2016-03-15 NOTE — Patient Instructions (Signed)
Patient Instructions  Patient Details  Name: Margaret Hendricks MRN: [id number] Date of Birth: 02-19-1956 Referring Provider:  Dionisio David, MD  Below are the personal goals you chose as well as exercise and nutrition goals. Our goal is to help you keep on track towards obtaining and maintaining your goals. We will be discussing your progress on these goals with you throughout the program.  Initial Exercise Prescription:     Initial Exercise Prescription - 03/15/16 1400    Date of Initial Exercise RX and Referring Provider   Date 03/15/16   Referring Provider Margaret Laming, MD   Treadmill   MPH 2.3   Grade 2   Minutes 15   METs 3.4   Bike   Level 1.1  [place]   Watts 40   Minutes 15   METs 3.4   Recumbant Bike   Level 2   RPM 50   Watts 40   Minutes 15   METs 3.4   NuStep   Level 3   Watts 40   Minutes 15   METs 3.4   Cybex   Level 1   RPM 50   Minutes 15   METs 3.4   Recumbant Elliptical   Level 2   RPM 50   Watts 40   Minutes 15   METs 3.4   Elliptical   Level 1   Speed 50   Minutes 5   METs 3.4   REL-XR   Level 2   Watts 40   Minutes 15   METs 3.4   T5 Nustep   Level 2   Watts 40   Minutes 15   METs 3.4   Biostep-RELP   Level 2   Watts 40   Minutes 15   METs 3.4   Prescription Details   Frequency (times per week) 3   Duration Progress to 30 minutes of continuous aerobic without signs/symptoms of physical distress   Intensity   THRR REST +  30   Ratings of Perceived Exertion 11-13   Progression   Progression Continue to progress workloads to maintain intensity without signs/symptoms of physical distress.   Resistance Training   Training Prescription Yes   Weight  2 lb.   Reps 10-12      Exercise Goals: Frequency: Be able to perform aerobic exercise three times per week working toward 3-5 days per week.  Intensity: Work with a perceived exertion of 11 (fairly light) - 15 (hard) as tolerated. Follow your new exercise prescription  and watch for changes in prescription as you progress with the program. Changes will be reviewed with you when they are made.  Duration: You should be able to do 30 minutes of continuous aerobic exercise in addition to a 5 minute warm-up and a 5 minute cool-down routine.  Nutrition Goals: Your personal nutrition goals will be established when you do your nutrition analysis with the dietician.  The following are nutrition guidelines to follow: Cholesterol < 200mg /day Sodium < 1500mg /day Fiber: Women over 50 yrs - 21 grams per day  Personal Goals:     Personal Goals and Risk Factors at Admission - 03/15/16 1435    Core Components/Risk Factors/Patient Goals on Admission   Tobacco Cessation Yes   Intervention Assist the participant in steps to quit. Provide individualized education and counseling about committing to Tobacco Cessation, relapse prevention, and pharmacological support that can be provided by physician.;Advice worker, assist with locating and accessing local/national Quit Smoking programs, and support quit date  choice.   Expected Outcomes Short Term: Will demonstrate readiness to quit, by selecting a quit date.;Long Term: Complete abstinence from all tobacco products for at least 12 months from quit date.;Short Term: Will quit all tobacco product use, adhering to prevention of relapse plan.   Heart Failure Yes   Intervention Provide a combined exercise and nutrition program that is supplemented with education, support and counseling about heart failure. Directed toward relieving symptoms such as shortness of breath, decreased exercise tolerance, and extremity edema.   Expected Outcomes Improve functional capacity of life;Short term: Attendance in program 2-3 days a week with increased exercise capacity. Reported lower sodium intake. Reported increased fruit and vegetable intake. Reports medication compliance.;Short term: Daily weights obtained and reported for increase.  Utilizing diuretic protocols set by physician.;Long term: Adoption of self-care skills and reduction of barriers for early signs and symptoms recognition and intervention leading to self-care maintenance.      Tobacco Use Initial Evaluation: History  Smoking status  . Current Every Day Smoker  . Last Attempt to Quit: 01/28/2015  Smokeless tobacco  . Not on file    Comment: Margaret Hendricks admits she smokes 1/2 cigarette in am on the way to work and 1/2 cigarette on the way home from work.     Copy of goals given to participant.

## 2016-03-15 NOTE — Progress Notes (Signed)
Cardiac Individual Treatment Plan  Patient Details  Name: Margaret Hendricks MRN: [id number] Date of Birth: April 29, 1956 Referring Provider:        Cardiac Rehab from 03/15/2016 in [facility] Cardiac and Pulmonary Rehab   Referring Provider  Neoma Laming, MD      Initial Encounter Date:       Cardiac Rehab from 03/15/2016 in [facility] Cardiac and Pulmonary Rehab   Date  03/15/16   Referring Provider  Neoma Laming, MD      Visit Diagnosis: Chronic combined systolic and diastolic congestive heart failure ([place])  Patient's Home Medications on Admission:  Current outpatient prescriptions:  .  albuterol (PROVENTIL HFA;VENTOLIN HFA) 108 (90 BASE) MCG/ACT inhaler, Inhale 2 puffs into the lungs every 6 (six) hours as needed for wheezing., Disp: , Rfl:  .  buPROPion (ZYBAN) 150 MG 12 hr tablet, Take 150 mg by mouth daily., Disp: , Rfl:  .  carvedilol (COREG) 6.25 MG tablet, Take 1 tablet (6.25 mg total) by mouth 2 (two) times daily with a meal. (Patient taking differently: Take 25 mg by mouth 2 (two) times daily. ), Disp: 30 tablet, Rfl: 0 .  digoxin (LANOXIN) 0.125 MG tablet, Take 0.125 mg by mouth daily., Disp: , Rfl:  .  fluticasone (FLOVENT HFA) 220 MCG/ACT inhaler, Inhale 1 puff into the lungs 2 (two) times daily., Disp: , Rfl:  .  furosemide (LASIX) 20 MG tablet, Take 20 mg by mouth daily., Disp: , Rfl:  .  ibuprofen (ADVIL,MOTRIN) 200 MG tablet, Take 600 mg by mouth every 6 (six) hours as needed for mild pain., Disp: , Rfl:  .  Ipratropium-Albuterol (COMBIVENT) 20-100 MCG/ACT AERS respimat, Inhale 1 puff into the lungs 4 (four) times daily., Disp: 1 Inhaler, Rfl: 0 .  Multiple Vitamin (MULTIVITAMIN WITH MINERALS) TABS tablet, Take 1 tablet by mouth daily., Disp: , Rfl:  .  sacubitril-valsartan (ENTRESTO) 24-26 MG, Take 1 tablet by mouth 2 (two) times daily. (Patient taking differently: Take 97-103 tablets by mouth 2 (two) times daily. ), Disp: 60 tablet, Rfl: 0 .  spironolactone (ALDACTONE) 25 MG  tablet, Take 1 tablet (25 mg total) by mouth daily., Disp: 30 tablet, Rfl: 0  Past Medical History: Past Medical History  Diagnosis Date  . COPD (chronic obstructive pulmonary disease) (HCC)     Tobacco Use: History  Smoking status  . Current Every Day Smoker  . Last Attempt to Quit: 01/28/2015  Smokeless tobacco  . Not on file    Comment: Shenice admits she smokes 1/2 cigarette in am on the way to work and 1/2 cigarette on the way home from work.     Labs: Recent Review Flowsheet Data    Labs for ITP Cardiac and Pulmonary Rehab Latest Ref Rng 03/14/2015   Cholestrol 0 - 200 mg/dL 126   LDLCALC 0 - 99 mg/dL 56   HDL >40 mg/dL 47   Trlycerides <150 mg/dL 114       Exercise Target Goals: Date: 03/15/16  Exercise Program Goal: Individual exercise prescription set with THRR, safety & activity barriers. Participant demonstrates ability to understand and report RPE using BORG scale, to self-measure pulse accurately, and to acknowledge the importance of the exercise prescription.  Exercise Prescription Goal: Starting with aerobic activity 30 plus minutes a day, 3 days per week for initial exercise prescription. Provide home exercise prescription and guidelines that participant acknowledges understanding prior to discharge.  Activity Barriers & Risk Stratification:   6 Minute Walk:     6 Minute  Walk      03/15/16 1433       6 Minute Walk   Distance 1400 feet     Walk Time 6 minutes     # of Rest Breaks 0     MPH 2.65     METS 3.46     RPE 11     VO2 Peak 12.11     Symptoms No     Resting HR 70 bpm     Resting BP 110/60 mmHg     Max Ex. HR 106 bpm     Max Ex. BP 126/64 mmHg        Initial Exercise Prescription:     Initial Exercise Prescription - 03/15/16 1400    Date of Initial Exercise RX and Referring Provider   Date 03/15/16   Referring Provider Neoma Laming, MD      Perform Capillary Blood Glucose checks as needed.  Exercise Prescription  Changes:   Exercise Comments:   Discharge Exercise Prescription (Final Exercise Prescription Changes):   Nutrition:  Target Goals: Understanding of nutrition guidelines, daily intake of sodium 1500mg , cholesterol 200mg , calories 30% from fat and 7% or less from saturated fats, daily to have 5 or more servings of fruits and vegetables.  Biometrics:    Nutrition Therapy Plan and Nutrition Goals:     Nutrition Therapy & Goals - 03/15/16 1442    Personal Nutrition Goals   Personal Goal #1 After Benjamine Mola quits smoking then we will make her a Cardiac Rehab REgistered Dietician appt. 2liter fluid restriction. Limit salt intake due to Heart Failure.      Nutrition Discharge: Rate Your Plate Scores:   Nutrition Goals Re-Evaluation:   Psychosocial: Target Goals: Acknowledge presence or absence of depression, maximize coping skills, provide positive support system. Participant is able to verbalize types and ability to use techniques and skills needed for reducing stress and depression.  Initial Review & Psychosocial Screening:     Initial Psych Review & Screening - 03/15/16 1436    Initial Review   Current issues with Current Sleep Concerns   Family Dynamics   Good Support System? Yes   Comments Janney mentioned that her daughter brings her 3 grandchildren from [place] to see her every few months.       Quality of Life Scores:     Quality of Life - 03/15/16 1432    Quality of Life Scores   Health/Function Pre 14 %   Socioeconomic Pre 18 %   Psych/Spiritual Pre 20.57 %   Family Pre 25.2 %   GLOBAL Pre 17.83 %      PHQ-9:     Recent Review Flowsheet Data    Depression screen [facility] 2/9 03/15/2016   Decreased Interest 0   Down, Depressed, Hopeless 0   PHQ - 2 Score 0   Altered sleeping 3   Tired, decreased energy 1   Change in appetite 0   Feeling bad or failure about yourself  0   Trouble concentrating 0   Moving slowly or fidgety/restless 0   Suicidal  thoughts 0   PHQ-9 Score 4   Difficult doing work/chores Somewhat difficult      Psychosocial Evaluation and Intervention:   Psychosocial Re-Evaluation:   Vocational Rehabilitation: Provide vocational rehab assistance to qualifying candidates.   Vocational Rehab Evaluation & Intervention:     Vocational Rehab - 03/15/16 1435    Initial Vocational Rehab Evaluation & Intervention   Assessment shows need for Vocational Rehabilitation No  Education: Education Goals: Education classes will be provided on a weekly basis, covering required topics. Participant will state understanding/return demonstration of topics presented.  Learning Barriers/Preferences:   Education Topics: General Nutrition Guidelines/Fats and Fiber: -Group instruction provided by verbal, written material, models and posters to present the general guidelines for heart healthy nutrition. Gives an explanation and review of dietary fats and fiber.   Controlling Sodium/Reading Food Labels: -Group verbal and written material supporting the discussion of sodium use in heart healthy nutrition. Review and explanation with models, verbal and written materials for utilization of the food label.   Exercise Physiology & Risk Factors: - Group verbal and written instruction with models to review the exercise physiology of the cardiovascular system and associated critical values. Details cardiovascular disease risk factors and the goals associated with each risk factor.   Aerobic Exercise & Resistance Training: - Gives group verbal and written discussion on the health impact of inactivity. On the components of aerobic and resistive training programs and the benefits of this training and how to safely progress through these programs.   Flexibility, Balance, General Exercise Guidelines: - Provides group verbal and written instruction on the benefits of flexibility and balance training programs. Provides general  exercise guidelines with specific guidelines to those with heart or lung disease. Demonstration and skill practice provided.   Stress Management: - Provides group verbal and written instruction about the health risks of elevated stress, cause of high stress, and healthy ways to reduce stress.   Depression: - Provides group verbal and written instruction on the correlation between heart/lung disease and depressed mood, treatment options, and the stigmas associated with seeking treatment.   Anatomy & Physiology of the Heart: - Group verbal and written instruction and models provide basic cardiac anatomy and physiology, with the coronary electrical and arterial systems. Review of: AMI, Angina, Valve disease, Heart Failure, Cardiac Arrhythmia, Pacemakers, and the ICD.   Cardiac Procedures: - Group verbal and written instruction and models to describe the testing methods done to diagnose heart disease. Reviews the outcomes of the test results. Describes the treatment choices: Medical Management, Angioplasty, or Coronary Bypass Surgery.   Cardiac Medications: - Group verbal and written instruction to review commonly prescribed medications for heart disease. Reviews the medication, class of the drug, and side effects. Includes the steps to properly store meds and maintain the prescription regimen.   Go Sex-Intimacy & Heart Disease, Get SMART - Goal Setting: - Group verbal and written instruction through game format to discuss heart disease and the return to sexual intimacy. Provides group verbal and written material to discuss and apply goal setting through the application of the S.M.A.R.T. Method.   Other Matters of the Heart: - Provides group verbal, written materials and models to describe Heart Failure, Angina, Valve Disease, and Diabetes in the realm of heart disease. Includes description of the disease process and treatment options available to the cardiac patient.   Exercise &  Equipment Safety: - Individual verbal instruction and demonstration of equipment use and safety with use of the equipment.          Cardiac Rehab from 03/15/2016 in [facility] Cardiac and Pulmonary Rehab   Date  03/15/16   Educator  C. EnterkinRN   Instruction Review Code  2- meets goals/outcomes      Infection Prevention: - Provides verbal and written material to individual with discussion of infection control including proper hand washing and proper equipment cleaning during exercise session.      Cardiac Rehab  from 03/15/2016 in [facility] Cardiac and Pulmonary Rehab   Date  03/15/16   Educator  C. EnterkinRN   Instruction Review Code  2- meets goals/outcomes      Falls Prevention: - Provides verbal and written material to individual with discussion of falls prevention and safety.      Cardiac Rehab from 03/15/2016 in [facility] Cardiac and Pulmonary Rehab   Date  03/15/16   Educator  C. Mine [place]   Instruction Review Code  2- meets goals/outcomes      Diabetes: - Individual verbal and written instruction to review signs/symptoms of diabetes, desired ranges of glucose level fasting, after meals and with exercise. Advice that pre and post exercise glucose checks will be done for 3 sessions at entry of program.    Knowledge Questionnaire Score:     Knowledge Questionnaire Score - 03/15/16 1434    Knowledge Questionnaire Score   Pre Score 23      Core Components/Risk Factors/Patient Goals at Admission:     Personal Goals and Risk Factors at Admission - 03/15/16 1435    Core Components/Risk Factors/Patient Goals on Admission   Tobacco Cessation Yes   Intervention Assist the participant in steps to quit. Provide individualized education and counseling about committing to Tobacco Cessation, relapse prevention, and pharmacological support that can be provided by physician.;Advice worker, assist with locating and accessing local/national Quit Smoking programs, and support quit  date choice.   Expected Outcomes Short Term: Will demonstrate readiness to quit, by selecting a quit date.;Long Term: Complete abstinence from all tobacco products for at least 12 months from quit date.;Short Term: Will quit all tobacco product use, adhering to prevention of relapse plan.   Heart Failure Yes   Intervention Provide a combined exercise and nutrition program that is supplemented with education, support and counseling about heart failure. Directed toward relieving symptoms such as shortness of breath, decreased exercise tolerance, and extremity edema.   Expected Outcomes Improve functional capacity of life;Short term: Attendance in program 2-3 days a week with increased exercise capacity. Reported lower sodium intake. Reported increased fruit and vegetable intake. Reports medication compliance.;Short term: Daily weights obtained and reported for increase. Utilizing diuretic protocols set by physician.;Long term: Adoption of self-care skills and reduction of barriers for early signs and symptoms recognition and intervention leading to self-care maintenance.      Core Components/Risk Factors/Patient Goals Review:    Core Components/Risk Factors/Patient Goals at Discharge (Final Review):    ITP Comments:   Comments: Ready to start Cardiac Rehab. Kanika has a life vest on that she has had on for 1 year she reports.

## 2016-03-15 NOTE — Progress Notes (Signed)
Cardiac Individual Treatment Plan  Patient Details  Name: Margaret Hendricks MRN: [id number] Date of Birth: 1955-12-25 Referring Provider:        Cardiac Rehab from 03/15/2016 in [facility] Cardiac and Pulmonary Rehab   Referring Provider  Margaret Laming, MD      Initial Encounter Date:       Cardiac Rehab from 03/15/2016 in [facility] Cardiac and Pulmonary Rehab   Date  03/15/16   Referring Provider  Margaret Laming, MD      Visit Diagnosis: Chronic combined systolic and diastolic congestive heart failure ([place]) - Plan: CARDIAC REHAB 30 DAY REVIEW  Patient's Home Medications on Admission:  Current outpatient prescriptions:  .  albuterol (PROVENTIL HFA;VENTOLIN HFA) 108 (90 BASE) MCG/ACT inhaler, Inhale 2 puffs into the lungs every 6 (six) hours as needed for wheezing., Disp: , Rfl:  .  buPROPion (ZYBAN) 150 MG 12 hr tablet, Take 150 mg by mouth daily., Disp: , Rfl:  .  carvedilol (COREG) 6.25 MG tablet, Take 1 tablet (6.25 mg total) by mouth 2 (two) times daily with a meal. (Patient taking differently: Take 25 mg by mouth 2 (two) times daily. ), Disp: 30 tablet, Rfl: 0 .  digoxin (LANOXIN) 0.125 MG tablet, Take 0.125 mg by mouth daily., Disp: , Rfl:  .  fluticasone (FLOVENT HFA) 220 MCG/ACT inhaler, Inhale 1 puff into the lungs 2 (two) times daily., Disp: , Rfl:  .  furosemide (LASIX) 20 MG tablet, Take 20 mg by mouth daily., Disp: , Rfl:  .  ibuprofen (ADVIL,MOTRIN) 200 MG tablet, Take 600 mg by mouth every 6 (six) hours as needed for mild pain., Disp: , Rfl:  .  Ipratropium-Albuterol (COMBIVENT) 20-100 MCG/ACT AERS respimat, Inhale 1 puff into the lungs 4 (four) times daily., Disp: 1 Inhaler, Rfl: 0 .  Multiple Vitamin (MULTIVITAMIN WITH MINERALS) TABS tablet, Take 1 tablet by mouth daily., Disp: , Rfl:  .  sacubitril-valsartan (ENTRESTO) 24-26 MG, Take 1 tablet by mouth 2 (two) times daily. (Patient taking differently: Take 97-103 tablets by mouth 2 (two) times daily. ), Disp: 60 tablet, Rfl: 0 .   spironolactone (ALDACTONE) 25 MG tablet, Take 1 tablet (25 mg total) by mouth daily., Disp: 30 tablet, Rfl: 0  Past Medical History: Past Medical History  Diagnosis Date  . COPD (chronic obstructive pulmonary disease) (HCC)     Tobacco Use: History  Smoking status  . Current Every Day Smoker  . Last Attempt to Quit: 01/28/2015  Smokeless tobacco  . Not on file    Comment: Margaret Hendricks admits she smokes 1/2 cigarette in am on the way to work and 1/2 cigarette on the way home from work.     Labs: Recent Review Flowsheet Data    Labs for ITP Cardiac and Pulmonary Rehab Latest Ref Rng 03/14/2015   Cholestrol 0 - 200 mg/dL 126   LDLCALC 0 - 99 mg/dL 56   HDL >40 mg/dL 47   Trlycerides <150 mg/dL 114       Exercise Target Goals: Date: 03/15/16  Exercise Program Goal: Individual exercise prescription set with THRR, safety & activity barriers. Participant demonstrates ability to understand and report RPE using BORG scale, to self-measure pulse accurately, and to acknowledge the importance of the exercise prescription.  Exercise Prescription Goal: Starting with aerobic activity 30 plus minutes a day, 3 days per week for initial exercise prescription. Provide home exercise prescription and guidelines that participant acknowledges understanding prior to discharge.  Activity Barriers & Risk Stratification:   6 Minute  Walk:     6 Minute Walk      03/15/16 1433       6 Minute Walk   Distance 1400 feet     Walk Time 6 minutes     # of Rest Breaks 0     MPH 2.65     METS 3.46     RPE 11     VO2 Peak 12.11     Symptoms No     Resting HR 70 bpm     Resting BP 110/60 mmHg     Max Ex. HR 106 bpm     Max Ex. BP 126/64 mmHg        Initial Exercise Prescription:     Initial Exercise Prescription - 03/15/16 1400    Date of Initial Exercise RX and Referring Provider   Date 03/15/16   Referring Provider Margaret Laming, MD   Treadmill   MPH 2.3   Grade 2   Minutes 15   METs  3.4   Bike   Level 1.1  Airdyne   Watts 40   Minutes 15   METs 3.4   Recumbant Bike   Level 2   RPM 50   Watts 40   Minutes 15   METs 3.4   NuStep   Level 3   Watts 40   Minutes 15   METs 3.4   Cybex   Level 1   RPM 50   Minutes 15   METs 3.4   Recumbant Elliptical   Level 2   RPM 50   Watts 40   Minutes 15   METs 3.4   Elliptical   Level 1   Speed 50   Minutes 5   METs 3.4   REL-XR   Level 2   Watts 40   Minutes 15   METs 3.4   T5 Nustep   Level 2   Watts 40   Minutes 15   METs 3.4   Biostep-RELP   Level 2   Watts 40   Minutes 15   METs 3.4   Prescription Details   Frequency (times per week) 3   Duration Progress to 30 minutes of continuous aerobic without signs/symptoms of physical distress   Intensity   THRR REST +  30   Ratings of Perceived Exertion 11-13   Progression   Progression Continue to progress workloads to maintain intensity without signs/symptoms of physical distress.   Resistance Training   Training Prescription Yes   Weight  2 lb.   Reps 10-12      Perform Capillary Blood Glucose checks as needed.  Exercise Prescription Changes:   Exercise Comments:   Discharge Exercise Prescription (Final Exercise Prescription Changes):   Nutrition:  Target Goals: Understanding of nutrition guidelines, daily intake of sodium 1500mg , cholesterol 200mg , calories 30% from fat and 7% or less from saturated fats, daily to have 5 or more servings of fruits and vegetables.  Biometrics:     Pre Biometrics - 03/15/16 1510    Pre Biometrics   Height 5' 3.4" (1.61 m)   Weight 187 lb (84.823 kg)   Waist Circumference 39.5 inches   Hip Circumference 46.75 inches   Waist to Hip Ratio 0.84 %   BMI (Calculated) 32.8       Nutrition Therapy Plan and Nutrition Goals:     Nutrition Therapy & Goals - 03/15/16 1442    Personal Nutrition Goals   Personal Goal #1 After Margaret Hendricks quits smoking then we  will make her a Cardiac Rehab  REgistered Dietician appt. 2liter fluid restriction. Limit salt intake due to Heart Failure.      Nutrition Discharge: Rate Your Plate Scores:   Nutrition Goals Re-Evaluation:   Psychosocial: Target Goals: Acknowledge presence or absence of depression, maximize coping skills, provide positive support system. Participant is able to verbalize types and ability to use techniques and skills needed for reducing stress and depression.  Initial Review & Psychosocial Screening:     Initial Psych Review & Screening - 03/15/16 1436    Initial Review   Current issues with Current Sleep Concerns   Family Dynamics   Good Support System? Yes   Comments Idalie mentioned that her daughter brings her 3 grandchildren from [place] to see her every few months.       Quality of Life Scores:     Quality of Life - 03/15/16 1432    Quality of Life Scores   Health/Function Pre 14 %   Socioeconomic Pre 18 %   Psych/Spiritual Pre 20.57 %   Family Pre 25.2 %   GLOBAL Pre 17.83 %      PHQ-9:     Recent Review Flowsheet Data    Depression screen [facility] 2/9 03/15/2016   Decreased Interest 0   Down, Depressed, Hopeless 0   PHQ - 2 Score 0   Altered sleeping 3   Tired, decreased energy 1   Change in appetite 0   Feeling bad or failure about yourself  0   Trouble concentrating 0   Moving slowly or fidgety/restless 0   Suicidal thoughts 0   PHQ-9 Score 4   Difficult doing work/chores Somewhat difficult      Psychosocial Evaluation and Intervention:   Psychosocial Re-Evaluation:   Vocational Rehabilitation: Provide vocational rehab assistance to qualifying candidates.   Vocational Rehab Evaluation & Intervention:     Vocational Rehab - 03/15/16 1435    Initial Vocational Rehab Evaluation & Intervention   Assessment shows need for Vocational Rehabilitation No      Education: Education Goals: Education classes will be provided on a weekly basis, covering required topics. Participant  will state understanding/return demonstration of topics presented.  Learning Barriers/Preferences:   Education Topics: General Nutrition Guidelines/Fats and Fiber: -Group instruction provided by verbal, written material, models and posters to present the general guidelines for heart healthy nutrition. Gives an explanation and review of dietary fats and fiber.   Controlling Sodium/Reading Food Labels: -Group verbal and written material supporting the discussion of sodium use in heart healthy nutrition. Review and explanation with models, verbal and written materials for utilization of the food label.   Exercise Physiology & Risk Factors: - Group verbal and written instruction with models to review the exercise physiology of the cardiovascular system and associated critical values. Details cardiovascular disease risk factors and the goals associated with each risk factor.   Aerobic Exercise & Resistance Training: - Gives group verbal and written discussion on the health impact of inactivity. On the components of aerobic and resistive training programs and the benefits of this training and how to safely progress through these programs.   Flexibility, Balance, General Exercise Guidelines: - Provides group verbal and written instruction on the benefits of flexibility and balance training programs. Provides general exercise guidelines with specific guidelines to those with heart or lung disease. Demonstration and skill practice provided.   Stress Management: - Provides group verbal and written instruction about the health risks of elevated stress, cause of high stress, and  healthy ways to reduce stress.   Depression: - Provides group verbal and written instruction on the correlation between heart/lung disease and depressed mood, treatment options, and the stigmas associated with seeking treatment.   Anatomy & Physiology of the Heart: - Group verbal and written instruction and models  provide basic cardiac anatomy and physiology, with the coronary electrical and arterial systems. Review of: AMI, Angina, Valve disease, Heart Failure, Cardiac Arrhythmia, Pacemakers, and the ICD.   Cardiac Procedures: - Group verbal and written instruction and models to describe the testing methods done to diagnose heart disease. Reviews the outcomes of the test results. Describes the treatment choices: Medical Management, Angioplasty, or Coronary Bypass Surgery.   Cardiac Medications: - Group verbal and written instruction to review commonly prescribed medications for heart disease. Reviews the medication, class of the drug, and side effects. Includes the steps to properly store meds and maintain the prescription regimen.   Go Sex-Intimacy & Heart Disease, Get SMART - Goal Setting: - Group verbal and written instruction through game format to discuss heart disease and the return to sexual intimacy. Provides group verbal and written material to discuss and apply goal setting through the application of the S.M.A.R.T. Method.   Other Matters of the Heart: - Provides group verbal, written materials and models to describe Heart Failure, Angina, Valve Disease, and Diabetes in the realm of heart disease. Includes description of the disease process and treatment options available to the cardiac patient.   Exercise & Equipment Safety: - Individual verbal instruction and demonstration of equipment use and safety with use of the equipment.          Cardiac Rehab from 03/15/2016 in [facility] Cardiac and Pulmonary Rehab   Date  03/15/16   Educator  C. EnterkinRN   Instruction Review Code  2- meets goals/outcomes      Infection Prevention: - Provides verbal and written material to individual with discussion of infection control including proper hand washing and proper equipment cleaning during exercise session.      Cardiac Rehab from 03/15/2016 in [facility] Cardiac and Pulmonary Rehab   Date  03/15/16    Educator  C. EnterkinRN   Instruction Review Code  2- meets goals/outcomes      Falls Prevention: - Provides verbal and written material to individual with discussion of falls prevention and safety.      Cardiac Rehab from 03/15/2016 in [facility] Cardiac and Pulmonary Rehab   Date  03/15/16   Educator  C. [place]ney   Instruction Review Code  2- meets goals/outcomes      Diabetes: - Individual verbal and written instruction to review signs/symptoms of diabetes, desired ranges of glucose level fasting, after meals and with exercise. Advice that pre and post exercise glucose checks will be done for 3 sessions at entry of program.    Knowledge Questionnaire Score:     Knowledge Questionnaire Score - 03/15/16 1434    Knowledge Questionnaire Score   Pre Score 23      Core Components/Risk Factors/Patient Goals at Admission:     Personal Goals and Risk Factors at Admission - 03/15/16 1435    Core Components/Risk Factors/Patient Goals on Admission   Tobacco Cessation Yes   Intervention Assist the participant in steps to quit. Provide individualized education and counseling about committing to Tobacco Cessation, relapse prevention, and pharmacological support that can be provided by physician.;Advice worker, assist with locating and accessing local/national Quit Smoking programs, and support quit date choice.   Expected Outcomes Short  Term: Will demonstrate readiness to quit, by selecting a quit date.;Long Term: Complete abstinence from all tobacco products for at least 12 months from quit date.;Short Term: Will quit all tobacco product use, adhering to prevention of relapse plan.   Heart Failure Yes   Intervention Provide a combined exercise and nutrition program that is supplemented with education, support and counseling about heart failure. Directed toward relieving symptoms such as shortness of breath, decreased exercise tolerance, and extremity edema.   Expected Outcomes  Improve functional capacity of life;Short term: Attendance in program 2-3 days a week with increased exercise capacity. Reported lower sodium intake. Reported increased fruit and vegetable intake. Reports medication compliance.;Short term: Daily weights obtained and reported for increase. Utilizing diuretic protocols set by physician.;Long term: Adoption of self-care skills and reduction of barriers for early signs and symptoms recognition and intervention leading to self-care maintenance.      Core Components/Risk Factors/Patient Goals Review:    Core Components/Risk Factors/Patient Goals at Discharge (Final Review):    ITP Comments:     ITP Comments      03/15/16 1522           ITP Comments Gretchin said she feels comfortable with her Life Vest since she has worn it for 1 year now.           Comments:

## 2016-03-18 ENCOUNTER — Encounter: Payer: Self-pay | Admitting: *Deleted

## 2016-03-18 DIAGNOSIS — I5042 Chronic combined systolic (congestive) and diastolic (congestive) heart failure: Secondary | ICD-10-CM

## 2016-03-18 NOTE — Progress Notes (Signed)
Cardiac Individual Treatment Plan  Patient Details  Name: Margaret Hendricks MRN: [id number] Date of Birth: April 01, 1956 Referring Provider:        Cardiac Rehab from 03/15/2016 in [facility] Cardiac and Pulmonary Rehab   Referring Provider  Neoma Laming, MD      Initial Encounter Date:       Cardiac Rehab from 03/15/2016 in [facility] Cardiac and Pulmonary Rehab   Date  03/15/16   Referring Provider  Neoma Laming, MD      Visit Diagnosis: Chronic combined systolic and diastolic congestive heart failure ([place])  Patient's Home Medications on Admission:  Current outpatient prescriptions:  .  albuterol (PROVENTIL HFA;VENTOLIN HFA) 108 (90 BASE) MCG/ACT inhaler, Inhale 2 puffs into the lungs every 6 (six) hours as needed for wheezing., Disp: , Rfl:  .  buPROPion (ZYBAN) 150 MG 12 hr tablet, Take 150 mg by mouth daily., Disp: , Rfl:  .  carvedilol (COREG) 6.25 MG tablet, Take 1 tablet (6.25 mg total) by mouth 2 (two) times daily with a meal. (Patient taking differently: Take 25 mg by mouth 2 (two) times daily. ), Disp: 30 tablet, Rfl: 0 .  digoxin (LANOXIN) 0.125 MG tablet, Take 0.125 mg by mouth daily., Disp: , Rfl:  .  fluticasone (FLOVENT HFA) 220 MCG/ACT inhaler, Inhale 1 puff into the lungs 2 (two) times daily., Disp: , Rfl:  .  furosemide (LASIX) 20 MG tablet, Take 20 mg by mouth daily., Disp: , Rfl:  .  ibuprofen (ADVIL,MOTRIN) 200 MG tablet, Take 600 mg by mouth every 6 (six) hours as needed for mild pain., Disp: , Rfl:  .  Ipratropium-Albuterol (COMBIVENT) 20-100 MCG/ACT AERS respimat, Inhale 1 puff into the lungs 4 (four) times daily., Disp: 1 Inhaler, Rfl: 0 .  Multiple Vitamin (MULTIVITAMIN WITH MINERALS) TABS tablet, Take 1 tablet by mouth daily., Disp: , Rfl:  .  sacubitril-valsartan (ENTRESTO) 24-26 MG, Take 1 tablet by mouth 2 (two) times daily. (Patient taking differently: Take 97-103 tablets by mouth 2 (two) times daily. ), Disp: 60 tablet, Rfl: 0 .  spironolactone (ALDACTONE) 25 MG  tablet, Take 1 tablet (25 mg total) by mouth daily., Disp: 30 tablet, Rfl: 0  Past Medical History: Past Medical History  Diagnosis Date  . COPD (chronic obstructive pulmonary disease) (HCC)     Tobacco Use: History  Smoking status  . Current Every Day Smoker  . Last Attempt to Quit: 01/28/2015  Smokeless tobacco  . Not on file    Comment: Kiana admits she smokes 1/2 cigarette in am on the way to work and 1/2 cigarette on the way home from work.     Labs: Recent Review Flowsheet Data    Labs for ITP Cardiac and Pulmonary Rehab Latest Ref Rng 03/14/2015   Cholestrol 0 - 200 mg/dL 126   LDLCALC 0 - 99 mg/dL 56   HDL >40 mg/dL 47   Trlycerides <150 mg/dL 114       Exercise Target Goals:    Exercise Program Goal: Individual exercise prescription set with THRR, safety & activity barriers. Participant demonstrates ability to understand and report RPE using BORG scale, to self-measure pulse accurately, and to acknowledge the importance of the exercise prescription.  Exercise Prescription Goal: Starting with aerobic activity 30 plus minutes a day, 3 days per week for initial exercise prescription. Provide home exercise prescription and guidelines that participant acknowledges understanding prior to discharge.  Activity Barriers & Risk Stratification:   6 Minute Walk:     6 Minute  Walk      03/15/16 1433       6 Minute Walk   Distance 1400 feet     Walk Time 6 minutes     # of Rest Breaks 0     MPH 2.65     METS 3.46     RPE 11     VO2 Peak 12.11     Symptoms No     Resting HR 70 bpm     Resting BP 110/60 mmHg     Max Ex. HR 106 bpm     Max Ex. BP 126/64 mmHg        Initial Exercise Prescription:     Initial Exercise Prescription - 03/15/16 1400    Date of Initial Exercise RX and Referring Provider   Date 03/15/16   Referring Provider Neoma Laming, MD   Treadmill   MPH 2.3   Grade 2   Minutes 15   METs 3.4   Bike   Level 1.1  Airdyne   Watts  40   Minutes 15   METs 3.4   Recumbant Bike   Level 2   RPM 50   Watts 40   Minutes 15   METs 3.4   NuStep   Level 3   Watts 40   Minutes 15   METs 3.4   Cybex   Level 1   RPM 50   Minutes 15   METs 3.4   Recumbant Elliptical   Level 2   RPM 50   Watts 40   Minutes 15   METs 3.4   Elliptical   Level 1   Speed 50   Minutes 5   METs 3.4   REL-XR   Level 2   Watts 40   Minutes 15   METs 3.4   T5 Nustep   Level 2   Watts 40   Minutes 15   METs 3.4   Biostep-RELP   Level 2   Watts 40   Minutes 15   METs 3.4   Prescription Details   Frequency (times per week) 3   Duration Progress to 30 minutes of continuous aerobic without signs/symptoms of physical distress   Intensity   THRR REST +  30   Ratings of Perceived Exertion 11-13   Progression   Progression Continue to progress workloads to maintain intensity without signs/symptoms of physical distress.   Resistance Training   Training Prescription Yes   Weight  2 lb.   Reps 10-12      Perform Capillary Blood Glucose checks as needed.  Exercise Prescription Changes:   Exercise Comments:   Discharge Exercise Prescription (Final Exercise Prescription Changes):   Nutrition:  Target Goals: Understanding of nutrition guidelines, daily intake of sodium <1575m, cholesterol <2052m calories 30% from fat and 7% or less from saturated fats, daily to have 5 or more servings of fruits and vegetables.  Biometrics:     Pre Biometrics - 03/15/16 1510    Pre Biometrics   Height 5' 3.4" (1.61 m)   Weight 187 lb (84.823 kg)   Waist Circumference 39.5 inches   Hip Circumference 46.75 inches   Waist to Hip Ratio 0.84 %   BMI (Calculated) 32.8       Nutrition Therapy Plan and Nutrition Goals:     Nutrition Therapy & Goals - 03/15/16 1442    Personal Nutrition Goals   Personal Goal #1 After ElBenjamine Molauits smoking then we will make her a Cardiac Rehab REgistered  Dietician appt. 2liter fluid restriction.  Limit salt intake due to Heart Failure.      Nutrition Discharge: Rate Your Plate Scores:   Nutrition Goals Re-Evaluation:   Psychosocial: Target Goals: Acknowledge presence or absence of depression, maximize coping skills, provide positive support system. Participant is able to verbalize types and ability to use techniques and skills needed for reducing stress and depression.  Initial Review & Psychosocial Screening:     Initial Psych Review & Screening - 03/15/16 1436    Initial Review   Current issues with Current Sleep Concerns   Family Dynamics   Good Support System? Yes   Comments Brecken mentioned that her daughter brings her 3 grandchildren from [place] to see her every few months.       Quality of Life Scores:     Quality of Life - 03/15/16 1432    Quality of Life Scores   Health/Function Pre 14 %   Socioeconomic Pre 18 %   Psych/Spiritual Pre 20.57 %   Family Pre 25.2 %   GLOBAL Pre 17.83 %      PHQ-9:     Recent Review Flowsheet Data    Depression screen [facility] 2/9 03/15/2016   Decreased Interest 0   Down, Depressed, Hopeless 0   PHQ - 2 Score 0   Altered sleeping 3   Tired, decreased energy 1   Change in appetite 0   Feeling bad or failure about yourself  0   Trouble concentrating 0   Moving slowly or fidgety/restless 0   Suicidal thoughts 0   PHQ-9 Score 4   Difficult doing work/chores Somewhat difficult      Psychosocial Evaluation and Intervention:   Psychosocial Re-Evaluation:   Vocational Rehabilitation: Provide vocational rehab assistance to qualifying candidates.   Vocational Rehab Evaluation & Intervention:     Vocational Rehab - 03/15/16 1435    Initial Vocational Rehab Evaluation & Intervention   Assessment shows need for Vocational Rehabilitation No      Education: Education Goals: Education classes will be provided on a weekly basis, covering required topics. Participant will state understanding/return demonstration of  topics presented.  Learning Barriers/Preferences:   Education Topics: General Nutrition Guidelines/Fats and Fiber: -Group instruction provided by verbal, written material, models and posters to present the general guidelines for heart healthy nutrition. Gives an explanation and review of dietary fats and fiber.   Controlling Sodium/Reading Food Labels: -Group verbal and written material supporting the discussion of sodium use in heart healthy nutrition. Review and explanation with models, verbal and written materials for utilization of the food label.   Exercise Physiology & Risk Factors: - Group verbal and written instruction with models to review the exercise physiology of the cardiovascular system and associated critical values. Details cardiovascular disease risk factors and the goals associated with each risk factor.   Aerobic Exercise & Resistance Training: - Gives group verbal and written discussion on the health impact of inactivity. On the components of aerobic and resistive training programs and the benefits of this training and how to safely progress through these programs.   Flexibility, Balance, General Exercise Guidelines: - Provides group verbal and written instruction on the benefits of flexibility and balance training programs. Provides general exercise guidelines with specific guidelines to those with heart or lung disease. Demonstration and skill practice provided.   Stress Management: - Provides group verbal and written instruction about the health risks of elevated stress, cause of high stress, and healthy ways to reduce stress.  Depression: - Provides group verbal and written instruction on the correlation between heart/lung disease and depressed mood, treatment options, and the stigmas associated with seeking treatment.   Anatomy & Physiology of the Heart: - Group verbal and written instruction and models provide basic cardiac anatomy and physiology, with  the coronary electrical and arterial systems. Review of: AMI, Angina, Valve disease, Heart Failure, Cardiac Arrhythmia, Pacemakers, and the ICD.   Cardiac Procedures: - Group verbal and written instruction and models to describe the testing methods done to diagnose heart disease. Reviews the outcomes of the test results. Describes the treatment choices: Medical Management, Angioplasty, or Coronary Bypass Surgery.   Cardiac Medications: - Group verbal and written instruction to review commonly prescribed medications for heart disease. Reviews the medication, class of the drug, and side effects. Includes the steps to properly store meds and maintain the prescription regimen.   Go Sex-Intimacy & Heart Disease, Get SMART - Goal Setting: - Group verbal and written instruction through game format to discuss heart disease and the return to sexual intimacy. Provides group verbal and written material to discuss and apply goal setting through the application of the S.M.A.R.T. Method.   Other Matters of the Heart: - Provides group verbal, written materials and models to describe Heart Failure, Angina, Valve Disease, and Diabetes in the realm of heart disease. Includes description of the disease process and treatment options available to the cardiac patient.   Exercise & Equipment Safety: - Individual verbal instruction and demonstration of equipment use and safety with use of the equipment.          Cardiac Rehab from 03/15/2016 in [facility] Cardiac and Pulmonary Rehab   Date  03/15/16   Educator  C. EnterkinRN   Instruction Review Code  2- meets goals/outcomes      Infection Prevention: - Provides verbal and written material to individual with discussion of infection control including proper hand washing and proper equipment cleaning during exercise session.      Cardiac Rehab from 03/15/2016 in [facility] Cardiac and Pulmonary Rehab   Date  03/15/16   Educator  C. EnterkinRN   Instruction Review Code   2- meets goals/outcomes      Falls Prevention: - Provides verbal and written material to individual with discussion of falls prevention and safety.      Cardiac Rehab from 03/15/2016 in [facility] Cardiac and Pulmonary Rehab   Date  03/15/16   Educator  C. [place]   Instruction Review Code  2- meets goals/outcomes      Diabetes: - Individual verbal and written instruction to review signs/symptoms of diabetes, desired ranges of glucose level fasting, after meals and with exercise. Advice that pre and post exercise glucose checks will be done for 3 sessions at entry of program.    Knowledge Questionnaire Score:     Knowledge Questionnaire Score - 03/15/16 1434    Knowledge Questionnaire Score   Pre Score 23      Core Components/Risk Factors/Patient Goals at Admission:     Personal Goals and Risk Factors at Admission - 03/15/16 1435    Core Components/Risk Factors/Patient Goals on Admission   Tobacco Cessation Yes   Intervention Assist the participant in steps to quit. Provide individualized education and counseling about committing to Tobacco Cessation, relapse prevention, and pharmacological support that can be provided by physician.;Advice worker, assist with locating and accessing local/national Quit Smoking programs, and support quit date choice.   Expected Outcomes Short Term: Will demonstrate readiness to quit, by  selecting a quit date.;Long Term: Complete abstinence from all tobacco products for at least 12 months from quit date.;Short Term: Will quit all tobacco product use, adhering to prevention of relapse plan.   Heart Failure Yes   Intervention Provide a combined exercise and nutrition program that is supplemented with education, support and counseling about heart failure. Directed toward relieving symptoms such as shortness of breath, decreased exercise tolerance, and extremity edema.   Expected Outcomes Improve functional capacity of life;Short term:  Attendance in program 2-3 days a week with increased exercise capacity. Reported lower sodium intake. Reported increased fruit and vegetable intake. Reports medication compliance.;Short term: Daily weights obtained and reported for increase. Utilizing diuretic protocols set by physician.;Long term: Adoption of self-care skills and reduction of barriers for early signs and symptoms recognition and intervention leading to self-care maintenance.      Core Components/Risk Factors/Patient Goals Review:    Core Components/Risk Factors/Patient Goals at Discharge (Final Review):    ITP Comments:     ITP Comments      03/15/16 1522 03/18/16 1135         ITP Comments [place] said she feels comfortable with her Life Vest since she has worn it for 1 year now.  30 day review. Continue with ITP. New to program  Orientation only , to start week of 5/22         Comments:

## 2016-03-19 DIAGNOSIS — I5042 Chronic combined systolic (congestive) and diastolic (congestive) heart failure: Secondary | ICD-10-CM | POA: Diagnosis not present

## 2016-03-19 NOTE — Progress Notes (Signed)
Daily Session Note  Patient Details  Name: Margaret Hendricks MRN: [id number] Date of Birth: 06-12-1956 Referring Provider:        Cardiac Rehab from 03/15/2016 in [facility] Cardiac and Pulmonary Rehab   Referring Provider  Neoma Laming, MD      Encounter Date: 03/19/2016  Check In:     Session Check In - 03/19/16 1608    Check-In   Location [facility]-Cardiac & Pulmonary Rehab   Staff Present Roanna Epley, RN, Vickki Hearing, BA, ACSM CEP, Exercise Physiologist;Lopez Dentinger Brayton El, DPT, Ronaldo Miyamoto, BS, ACSM CEP, Exercise Physiologist   Supervising physician immediately available to respond to emergencies See telemetry face sheet for immediately available ER MD   Medication changes reported     No   Fall or balance concerns reported    No   Warm-up and Cool-down Performed on first and last piece of equipment   Resistance Training Performed Yes   VAD Patient? No         Goals Met:  Exercise tolerated well  Goals Unmet:  Not Applicable  Comments: First day of exercise! Patient was oriented to the gym and the equipment functions and settings. Procedures and policies of the gym were outlined and explained. The patient's individual exercise prescription and treatment plan were reviewed with them. All starting workloads were established based on the results of the functional testing  done at the initial intake visit. The plan for exercise progression was also introduced and progression will be customized based on the patient's performance and goals.    Dr. Emily Filbert is Medical Director for [facility].

## 2016-03-29 ENCOUNTER — Encounter: Payer: BLUE CROSS/BLUE SHIELD | Attending: Cardiovascular Disease

## 2016-03-29 DIAGNOSIS — I5042 Chronic combined systolic (congestive) and diastolic (congestive) heart failure: Secondary | ICD-10-CM | POA: Insufficient documentation

## 2016-04-05 ENCOUNTER — Encounter: Payer: Self-pay | Admitting: *Deleted

## 2016-04-05 ENCOUNTER — Telehealth: Payer: Self-pay | Admitting: *Deleted

## 2016-04-05 NOTE — Telephone Encounter (Signed)
I left a vm to check on Peerless and asked her to call us back and let us know either way what her plans on. She has not been here in Cardiac Rehab in a while

## 2016-04-11 ENCOUNTER — Telehealth: Payer: Self-pay | Admitting: *Deleted

## 2016-04-11 ENCOUNTER — Encounter: Payer: BLUE CROSS/BLUE SHIELD | Admitting: *Deleted

## 2016-04-11 ENCOUNTER — Encounter: Payer: Self-pay | Admitting: *Deleted

## 2016-04-11 NOTE — Telephone Encounter (Signed)
I left a vm at 2:55pm and asked Raileigh to let us know either way if she is going to return to Cardiac Rehab.

## 2016-04-11 NOTE — Telephone Encounter (Signed)
I called Gema's phone number and housemate said Adlih is at work but she will be home by 2:30pm. I called to see if she was coming back to Cardiac Rehab.

## 2016-04-11 NOTE — Progress Notes (Signed)
Cardiac Individual Treatment Plan  Patient Details  Name: Margaret Hendricks MRN: [id number] Date of Birth: April 01, 1956 Referring Provider:        Cardiac Rehab from 03/15/2016 in [facility] Cardiac and Pulmonary Rehab   Referring Provider  Neoma Laming, MD      Initial Encounter Date:       Cardiac Rehab from 03/15/2016 in [facility] Cardiac and Pulmonary Rehab   Date  03/15/16   Referring Provider  Neoma Laming, MD      Visit Diagnosis: Chronic combined systolic and diastolic congestive heart failure ([place])  Patient's Home Medications on Admission:  Current outpatient prescriptions:  .  albuterol (PROVENTIL HFA;VENTOLIN HFA) 108 (90 BASE) MCG/ACT inhaler, Inhale 2 puffs into the lungs every 6 (six) hours as needed for wheezing., Disp: , Rfl:  .  buPROPion (ZYBAN) 150 MG 12 hr tablet, Take 150 mg by mouth daily., Disp: , Rfl:  .  carvedilol (COREG) 6.25 MG tablet, Take 1 tablet (6.25 mg total) by mouth 2 (two) times daily with a meal. (Patient taking differently: Take 25 mg by mouth 2 (two) times daily. ), Disp: 30 tablet, Rfl: 0 .  digoxin (LANOXIN) 0.125 MG tablet, Take 0.125 mg by mouth daily., Disp: , Rfl:  .  fluticasone (FLOVENT HFA) 220 MCG/ACT inhaler, Inhale 1 puff into the lungs 2 (two) times daily., Disp: , Rfl:  .  furosemide (LASIX) 20 MG tablet, Take 20 mg by mouth daily., Disp: , Rfl:  .  ibuprofen (ADVIL,MOTRIN) 200 MG tablet, Take 600 mg by mouth every 6 (six) hours as needed for mild pain., Disp: , Rfl:  .  Ipratropium-Albuterol (COMBIVENT) 20-100 MCG/ACT AERS respimat, Inhale 1 puff into the lungs 4 (four) times daily., Disp: 1 Inhaler, Rfl: 0 .  Multiple Vitamin (MULTIVITAMIN WITH MINERALS) TABS tablet, Take 1 tablet by mouth daily., Disp: , Rfl:  .  sacubitril-valsartan (ENTRESTO) 24-26 MG, Take 1 tablet by mouth 2 (two) times daily. (Patient taking differently: Take 97-103 tablets by mouth 2 (two) times daily. ), Disp: 60 tablet, Rfl: 0 .  spironolactone (ALDACTONE) 25 MG  tablet, Take 1 tablet (25 mg total) by mouth daily., Disp: 30 tablet, Rfl: 0  Past Medical History: Past Medical History  Diagnosis Date  . COPD (chronic obstructive pulmonary disease) (HCC)     Tobacco Use: History  Smoking status  . Current Every Day Smoker  . Last Attempt to Quit: 01/28/2015  Smokeless tobacco  . Not on file    Comment: Margaret Hendricks admits she smokes 1/2 cigarette in am on the way to work and 1/2 cigarette on the way home from work.     Labs: Recent Review Flowsheet Data    Labs for ITP Cardiac and Pulmonary Rehab Latest Ref Rng 03/14/2015   Cholestrol 0 - 200 mg/dL 126   LDLCALC 0 - 99 mg/dL 56   HDL >40 mg/dL 47   Trlycerides <150 mg/dL 114       Exercise Target Goals:    Exercise Program Goal: Individual exercise prescription set with THRR, safety & activity barriers. Participant demonstrates ability to understand and report RPE using BORG scale, to self-measure pulse accurately, and to acknowledge the importance of the exercise prescription.  Exercise Prescription Goal: Starting with aerobic activity 30 plus minutes a day, 3 days per week for initial exercise prescription. Provide home exercise prescription and guidelines that participant acknowledges understanding prior to discharge.  Activity Barriers & Risk Stratification:   6 Minute Walk:     6 Minute  Walk      03/15/16 1433       6 Minute Walk   Distance 1400 feet     Walk Time 6 minutes     # of Rest Breaks 0     MPH 2.65     METS 3.46     RPE 11     VO2 Peak 12.11     Symptoms No     Resting HR 70 bpm     Resting BP 110/60 mmHg     Max Ex. HR 106 bpm     Max Ex. BP 126/64 mmHg        Initial Exercise Prescription:     Initial Exercise Prescription - 03/15/16 1400    Date of Initial Exercise RX and Referring Provider   Date 03/15/16   Referring Provider Neoma Laming, MD   Treadmill   MPH 2.3   Grade 2   Minutes 15   METs 3.4   Bike   Level 1.1  Airdyne   Watts  40   Minutes 15   METs 3.4   Recumbant Bike   Level 2   RPM 50   Watts 40   Minutes 15   METs 3.4   NuStep   Level 3   Watts 40   Minutes 15   METs 3.4   Cybex   Level 1   RPM 50   Minutes 15   METs 3.4   Recumbant Elliptical   Level 2   RPM 50   Watts 40   Minutes 15   METs 3.4   Elliptical   Level 1   Speed 50   Minutes 5   METs 3.4   REL-XR   Level 2   Watts 40   Minutes 15   METs 3.4   T5 Nustep   Level 2   Watts 40   Minutes 15   METs 3.4   Biostep-RELP   Level 2   Watts 40   Minutes 15   METs 3.4   Prescription Details   Frequency (times per week) 3   Duration Progress to 30 minutes of continuous aerobic without signs/symptoms of physical distress   Intensity   THRR REST +  30   Ratings of Perceived Exertion 11-13   Progression   Progression Continue to progress workloads to maintain intensity without signs/symptoms of physical distress.   Resistance Training   Training Prescription Yes   Weight  2 lb.   Reps 10-12      Perform Capillary Blood Glucose checks as needed.  Exercise Prescription Changes:   Exercise Comments:   Discharge Exercise Prescription (Final Exercise Prescription Changes):   Nutrition:  Target Goals: Understanding of nutrition guidelines, daily intake of sodium <1575m, cholesterol <2052m calories 30% from fat and 7% or less from saturated fats, daily to have 5 or more servings of fruits and vegetables.  Biometrics:     Pre Biometrics - 03/15/16 1510    Pre Biometrics   Height 5' 3.4" (1.61 m)   Weight 187 lb (84.823 kg)   Waist Circumference 39.5 inches   Hip Circumference 46.75 inches   Waist to Hip Ratio 0.84 %   BMI (Calculated) 32.8       Nutrition Therapy Plan and Nutrition Goals:     Nutrition Therapy & Goals - 03/15/16 1442    Personal Nutrition Goals   Personal Goal #1 After Margaret Hendricks smoking then we will make her a Cardiac Rehab REgistered  Dietician appt. 2liter fluid restriction.  Limit salt intake due to Heart Failure.      Nutrition Discharge: Rate Your Plate Scores:   Nutrition Goals Re-Evaluation:   Psychosocial: Target Goals: Acknowledge presence or absence of depression, maximize coping skills, provide positive support system. Participant is able to verbalize types and ability to use techniques and skills needed for reducing stress and depression.  Initial Review & Psychosocial Screening:     Initial Psych Review & Screening - 03/15/16 1436    Initial Review   Current issues with Current Sleep Concerns   Family Dynamics   Good Support System? Yes   Comments Margaret Hendricks mentioned that her daughter brings her 3 grandchildren from [place] to see her every few months.       Quality of Life Scores:     Quality of Life - 03/15/16 1432    Quality of Life Scores   Health/Function Pre 14 %   Socioeconomic Pre 18 %   Psych/Spiritual Pre 20.57 %   Family Pre 25.2 %   GLOBAL Pre 17.83 %      PHQ-9:     Recent Review Flowsheet Data    Depression screen [facility] 2/9 03/15/2016   Decreased Interest 0   Down, Depressed, Hopeless 0   PHQ - 2 Score 0   Altered sleeping 3   Tired, decreased energy 1   Change in appetite 0   Feeling bad or failure about yourself  0   Trouble concentrating 0   Moving slowly or fidgety/restless 0   Suicidal thoughts 0   PHQ-9 Score 4   Difficult doing work/chores Somewhat difficult      Psychosocial Evaluation and Intervention:     Psychosocial Evaluation - 03/19/16 1729    Psychosocial Evaluation & Interventions   Interventions Encouraged to exercise with the program and follow exercise prescription;Relaxation education;Stress management education   Comments Counselor met with Margaret Hendricks today for initial psychosocial evaluation.  She is a 60 year old who suffers from Congestive Heart Failure that was diagnosed about a year ago.  Margaret Hendricks states she has a strong support system with a roommate and her parents and sisters  who  live close by.  She reports sleeping well and having a good appetite.  Margaret Hendricks denies a history of depression or anxiety and states her current mood is typically positive most of the time.  She has some financial stress as well as being concerned about her health.  Margaret Hendricks also just moved back to [place] after being in [place] for the last 20 years, so that has been a little stressful as well.  Her goals for this program are to increase her stamina and strength and lose some weight.  She plans to continue walking her dog 3-4 days per week when not attending this program..      Psychosocial Re-Evaluation:   Vocational Rehabilitation: Provide vocational rehab assistance to qualifying candidates.   Vocational Rehab Evaluation & Intervention:     Vocational Rehab - 03/15/16 1435    Initial Vocational Rehab Evaluation & Intervention   Assessment shows need for Vocational Rehabilitation No      Education: Education Goals: Education classes will be provided on a weekly basis, covering required topics. Participant will state understanding/return demonstration of topics presented.  Learning Barriers/Preferences:   Education Topics: General Nutrition Guidelines/Fats and Fiber: -Group instruction provided by verbal, written material, models and posters to present the general guidelines for heart healthy nutrition. Gives an explanation and  review of dietary fats and fiber.   Controlling Sodium/Reading Food Labels: -Group verbal and written material supporting the discussion of sodium use in heart healthy nutrition. Review and explanation with models, verbal and written materials for utilization of the food label.   Exercise Physiology & Risk Factors: - Group verbal and written instruction with models to review the exercise physiology of the cardiovascular system and associated critical values. Details cardiovascular disease risk factors and the goals associated with each risk  factor.   Aerobic Exercise & Resistance Training: - Gives group verbal and written discussion on the health impact of inactivity. On the components of aerobic and resistive training programs and the benefits of this training and how to safely progress through these programs.   Flexibility, Balance, General Exercise Guidelines: - Provides group verbal and written instruction on the benefits of flexibility and balance training programs. Provides general exercise guidelines with specific guidelines to those with heart or lung disease. Demonstration and skill practice provided.   Stress Management: - Provides group verbal and written instruction about the health risks of elevated stress, cause of high stress, and healthy ways to reduce stress.   Depression: - Provides group verbal and written instruction on the correlation between heart/lung disease and depressed mood, treatment options, and the stigmas associated with seeking treatment.   Anatomy & Physiology of the Heart: - Group verbal and written instruction and models provide basic cardiac anatomy and physiology, with the coronary electrical and arterial systems. Review of: AMI, Angina, Valve disease, Heart Failure, Cardiac Arrhythmia, Pacemakers, and the ICD.   Cardiac Procedures: - Group verbal and written instruction and models to describe the testing methods done to diagnose heart disease. Reviews the outcomes of the test results. Describes the treatment choices: Medical Management, Angioplasty, or Coronary Bypass Surgery.   Cardiac Medications: - Group verbal and written instruction to review commonly prescribed medications for heart disease. Reviews the medication, class of the drug, and side effects. Includes the steps to properly store meds and maintain the prescription regimen.   Go Sex-Intimacy & Heart Disease, Get SMART - Goal Setting: - Group verbal and written instruction through game format to discuss heart disease and  the return to sexual intimacy. Provides group verbal and written material to discuss and apply goal setting through the application of the S.M.A.R.T. Method.   Other Matters of the Heart: - Provides group verbal, written materials and models to describe Heart Failure, Angina, Valve Disease, and Diabetes in the realm of heart disease. Includes description of the disease process and treatment options available to the cardiac patient.   Exercise & Equipment Safety: - Individual verbal instruction and demonstration of equipment use and safety with use of the equipment.          Cardiac Rehab from 03/19/2016 in [facility] Cardiac and Pulmonary Rehab   Date  03/15/16   Educator  C. EnterkinRN   Instruction Review Code  2- meets goals/outcomes      Infection Prevention: - Provides verbal and written material to individual with discussion of infection control including proper hand washing and proper equipment cleaning during exercise session.      Cardiac Rehab from 03/19/2016 in [facility] Cardiac and Pulmonary Rehab   Date  03/15/16   Educator  C. EnterkinRN   Instruction Review Code  2- meets goals/outcomes      Falls Prevention: - Provides verbal and written material to individual with discussion of falls prevention and safety.      Cardiac Rehab from  03/19/2016 in [facility] Cardiac and Pulmonary Rehab   Date  03/15/16   Educator  C. [place]   Instruction Review Code  2- meets goals/outcomes      Diabetes: - Individual verbal and written instruction to review signs/symptoms of diabetes, desired ranges of glucose level fasting, after meals and with exercise. Advice that pre and post exercise glucose checks will be done for 3 sessions at entry of program.    Knowledge Questionnaire Score:     Knowledge Questionnaire Score - 03/15/16 1434    Knowledge Questionnaire Score   Pre Score 23      Core Components/Risk Factors/Patient Goals at Admission:     Personal Goals and Risk Factors at  Admission - 03/15/16 1435    Core Components/Risk Factors/Patient Goals on Admission   Tobacco Cessation Yes   Intervention Assist the participant in steps to quit. Provide individualized education and counseling about committing to Tobacco Cessation, relapse prevention, and pharmacological support that can be provided by physician.;Advice worker, assist with locating and accessing local/national Quit Smoking programs, and support quit date choice.   Expected Outcomes Short Term: Will demonstrate readiness to quit, by selecting a quit date.;Long Term: Complete abstinence from all tobacco products for at least 12 months from quit date.;Short Term: Will quit all tobacco product use, adhering to prevention of relapse plan.   Heart Failure Yes   Intervention Provide a combined exercise and nutrition program that is supplemented with education, support and counseling about heart failure. Directed toward relieving symptoms such as shortness of breath, decreased exercise tolerance, and extremity edema.   Expected Outcomes Improve functional capacity of life;Short term: Attendance in program 2-3 days a week with increased exercise capacity. Reported lower sodium intake. Reported increased fruit and vegetable intake. Reports medication compliance.;Short term: Daily weights obtained and reported for increase. Utilizing diuretic protocols set by physician.;Long term: Adoption of self-care skills and reduction of barriers for early signs and symptoms recognition and intervention leading to self-care maintenance.      Core Components/Risk Factors/Patient Goals Review:      Goals and Risk Factor Review      04/05/16 1613           Core Components/Risk Factors/Patient Goals Review   Review I left a vm to check on Margaret Hendricks and asked her to call us back and let us know either way what her plans on. She has not been here in Cardiac Rehab in a while          Core Components/Risk  Factors/Patient Goals at Discharge (Final Review):      Goals and Risk Factor Review - 04/05/16 1613    Core Components/Risk Factors/Patient Goals Review   Review I left a vm to check on Margaret Hendricks and asked her to call us back and let us know either way what her plans on. She has not been here in Cardiac Rehab in a while      ITP Comments:     ITP Comments      03/15/16 1522 03/18/16 1135 04/05/16 1612 04/11/16 1013     ITP Comments Margaret Mola said she feels comfortable with her Life Vest since she has worn it for 1 year now.  30 day review. Continue with ITP. New to program  Orientation only , to start week of 5/22 I left a vm to check on Margaret Hendricks and asked her to call us back and let us know either way what her plans on.  I called Margaret Hendricks's phone number  and housemate said Margaret Hendricks is at work but she will be home by 2:30pm. I called to see if she was coming back to Cardiac Rehab.        Comments:

## 2016-04-18 ENCOUNTER — Encounter: Payer: Self-pay | Admitting: *Deleted

## 2016-04-18 ENCOUNTER — Telehealth: Payer: Self-pay | Admitting: *Deleted

## 2016-04-18 DIAGNOSIS — I5042 Chronic combined systolic (congestive) and diastolic (congestive) heart failure: Secondary | ICD-10-CM

## 2016-04-18 NOTE — Progress Notes (Signed)
Cardiac Individual Treatment Plan  Patient Details  Name: Margaret Hendricks MRN: [id number] Date of Birth: April 01, 1956 Referring Provider:        Cardiac Rehab from 03/15/2016 in [facility] Cardiac and Pulmonary Rehab   Referring Provider  Neoma Laming, MD      Initial Encounter Date:       Cardiac Rehab from 03/15/2016 in [facility] Cardiac and Pulmonary Rehab   Date  03/15/16   Referring Provider  Neoma Laming, MD      Visit Diagnosis: Chronic combined systolic and diastolic congestive heart failure ([place])  Patient's Home Medications on Admission:  Current outpatient prescriptions:  .  albuterol (PROVENTIL HFA;VENTOLIN HFA) 108 (90 BASE) MCG/ACT inhaler, Inhale 2 puffs into the lungs every 6 (six) hours as needed for wheezing., Disp: , Rfl:  .  buPROPion (ZYBAN) 150 MG 12 hr tablet, Take 150 mg by mouth daily., Disp: , Rfl:  .  carvedilol (COREG) 6.25 MG tablet, Take 1 tablet (6.25 mg total) by mouth 2 (two) times daily with a meal. (Patient taking differently: Take 25 mg by mouth 2 (two) times daily. ), Disp: 30 tablet, Rfl: 0 .  digoxin (LANOXIN) 0.125 MG tablet, Take 0.125 mg by mouth daily., Disp: , Rfl:  .  fluticasone (FLOVENT HFA) 220 MCG/ACT inhaler, Inhale 1 puff into the lungs 2 (two) times daily., Disp: , Rfl:  .  furosemide (LASIX) 20 MG tablet, Take 20 mg by mouth daily., Disp: , Rfl:  .  ibuprofen (ADVIL,MOTRIN) 200 MG tablet, Take 600 mg by mouth every 6 (six) hours as needed for mild pain., Disp: , Rfl:  .  Ipratropium-Albuterol (COMBIVENT) 20-100 MCG/ACT AERS respimat, Inhale 1 puff into the lungs 4 (four) times daily., Disp: 1 Inhaler, Rfl: 0 .  Multiple Vitamin (MULTIVITAMIN WITH MINERALS) TABS tablet, Take 1 tablet by mouth daily., Disp: , Rfl:  .  sacubitril-valsartan (ENTRESTO) 24-26 MG, Take 1 tablet by mouth 2 (two) times daily. (Patient taking differently: Take 97-103 tablets by mouth 2 (two) times daily. ), Disp: 60 tablet, Rfl: 0 .  spironolactone (ALDACTONE) 25 MG  tablet, Take 1 tablet (25 mg total) by mouth daily., Disp: 30 tablet, Rfl: 0  Past Medical History: Past Medical History  Diagnosis Date  . COPD (chronic obstructive pulmonary disease) (HCC)     Tobacco Use: History  Smoking status  . Current Every Day Smoker  . Last Attempt to Quit: 01/28/2015  Smokeless tobacco  . Not on file    Comment: Kiana admits she smokes 1/2 cigarette in am on the way to work and 1/2 cigarette on the way home from work.     Labs: Recent Review Flowsheet Data    Labs for ITP Cardiac and Pulmonary Rehab Latest Ref Rng 03/14/2015   Cholestrol 0 - 200 mg/dL 126   LDLCALC 0 - 99 mg/dL 56   HDL >40 mg/dL 47   Trlycerides <150 mg/dL 114       Exercise Target Goals:    Exercise Program Goal: Individual exercise prescription set with THRR, safety & activity barriers. Participant demonstrates ability to understand and report RPE using BORG scale, to self-measure pulse accurately, and to acknowledge the importance of the exercise prescription.  Exercise Prescription Goal: Starting with aerobic activity 30 plus minutes a day, 3 days per week for initial exercise prescription. Provide home exercise prescription and guidelines that participant acknowledges understanding prior to discharge.  Activity Barriers & Risk Stratification:   6 Minute Walk:     6 Minute  Walk      03/15/16 1433       6 Minute Walk   Distance 1400 feet     Walk Time 6 minutes     # of Rest Breaks 0     MPH 2.65     METS 3.46     RPE 11     VO2 Peak 12.11     Symptoms No     Resting HR 70 bpm     Resting BP 110/60 mmHg     Max Ex. HR 106 bpm     Max Ex. BP 126/64 mmHg        Initial Exercise Prescription:     Initial Exercise Prescription - 03/15/16 1400    Date of Initial Exercise RX and Referring Provider   Date 03/15/16   Referring Provider Neoma Laming, MD   Treadmill   MPH 2.3   Grade 2   Minutes 15   METs 3.4   Bike   Level 1.1  Airdyne   Watts  40   Minutes 15   METs 3.4   Recumbant Bike   Level 2   RPM 50   Watts 40   Minutes 15   METs 3.4   NuStep   Level 3   Watts 40   Minutes 15   METs 3.4   Cybex   Level 1   RPM 50   Minutes 15   METs 3.4   Recumbant Elliptical   Level 2   RPM 50   Watts 40   Minutes 15   METs 3.4   Elliptical   Level 1   Speed 50   Minutes 5   METs 3.4   REL-XR   Level 2   Watts 40   Minutes 15   METs 3.4   T5 Nustep   Level 2   Watts 40   Minutes 15   METs 3.4   Biostep-RELP   Level 2   Watts 40   Minutes 15   METs 3.4   Prescription Details   Frequency (times per week) 3   Duration Progress to 30 minutes of continuous aerobic without signs/symptoms of physical distress   Intensity   THRR REST +  30   Ratings of Perceived Exertion 11-13   Progression   Progression Continue to progress workloads to maintain intensity without signs/symptoms of physical distress.   Resistance Training   Training Prescription Yes   Weight  2 lb.   Reps 10-12      Perform Capillary Blood Glucose checks as needed.  Exercise Prescription Changes:   Exercise Comments:     Exercise Comments      04/12/16 1358           Exercise Comments Fidelis has not attended since 03/19/16.          Discharge Exercise Prescription (Final Exercise Prescription Changes):   Nutrition:  Target Goals: Understanding of nutrition guidelines, daily intake of sodium '1500mg'$ , cholesterol '200mg'$ , calories 30% from fat and 7% or less from saturated fats, daily to have 5 or more servings of fruits and vegetables.  Biometrics:     Pre Biometrics - 03/15/16 1510    Pre Biometrics   Height 5' 3.4" (1.61 m)   Weight 187 lb (84.823 kg)   Waist Circumference 39.5 inches   Hip Circumference 46.75 inches   Waist to Hip Ratio 0.84 %   BMI (Calculated) 32.8       Nutrition Therapy Plan  and Nutrition Goals:     Nutrition Therapy & Goals - 03/15/16 1442    Personal Nutrition Goals   Personal  Goal #1 After Benjamine Mola quits smoking then we will make her a Cardiac Rehab REgistered Dietician appt. 2liter fluid restriction. Limit salt intake due to Heart Failure.      Nutrition Discharge: Rate Your Plate Scores:   Nutrition Goals Re-Evaluation:   Psychosocial: Target Goals: Acknowledge presence or absence of depression, maximize coping skills, provide positive support system. Participant is able to verbalize types and ability to use techniques and skills needed for reducing stress and depression.  Initial Review & Psychosocial Screening:     Initial Psych Review & Screening - 03/15/16 1436    Initial Review   Current issues with Current Sleep Concerns   Family Dynamics   Good Support System? Yes   Comments Takela mentioned that her daughter brings her 3 grandchildren from [place] to see her every few months.       Quality of Life Scores:     Quality of Life - 03/15/16 1432    Quality of Life Scores   Health/Function Pre 14 %   Socioeconomic Pre 18 %   Psych/Spiritual Pre 20.57 %   Family Pre 25.2 %   GLOBAL Pre 17.83 %      PHQ-9:     Recent Review Flowsheet Data    Depression screen [facility] 2/9 03/15/2016   Decreased Interest 0   Down, Depressed, Hopeless 0   PHQ - 2 Score 0   Altered sleeping 3   Tired, decreased energy 1   Change in appetite 0   Feeling bad or failure about yourself  0   Trouble concentrating 0   Moving slowly or fidgety/restless 0   Suicidal thoughts 0   PHQ-9 Score 4   Difficult doing work/chores Somewhat difficult      Psychosocial Evaluation and Intervention:     Psychosocial Evaluation - 03/19/16 1729    Psychosocial Evaluation & Interventions   Interventions Encouraged to exercise with the program and follow exercise prescription;Relaxation education;Stress management education   Comments Counselor met with Ms. Bouse today for initial psychosocial evaluation.  She is a 60 year old who suffers from Congestive Heart Failure that  was diagnosed about a year ago.  Ms. Oguin states she has a strong support system with a roommate and her parents and sisters who  live close by.  She reports sleeping well and having a good appetite.  Ms. Michaelson denies a history of depression or anxiety and states her current mood is typically positive most of the time.  She has some financial stress as well as being concerned about her health.  Ms. Zappia also just moved back to [place] after being in [place] for the last 20 years, so that has been a little stressful as well.  Her goals for this program are to increase her stamina and strength and lose some weight.  She plans to continue walking her dog 3-4 days per week when not attending this program..      Psychosocial Re-Evaluation:   Vocational Rehabilitation: Provide vocational rehab assistance to qualifying candidates.   Vocational Rehab Evaluation & Intervention:     Vocational Rehab - 03/15/16 1435    Initial Vocational Rehab Evaluation & Intervention   Assessment shows need for Vocational Rehabilitation No      Education: Education Goals: Education classes will be provided on a weekly basis, covering required topics. Participant will state understanding/return demonstration  of topics presented.  Learning Barriers/Preferences:   Education Topics: General Nutrition Guidelines/Fats and Fiber: -Group instruction provided by verbal, written material, models and posters to present the general guidelines for heart healthy nutrition. Gives an explanation and review of dietary fats and fiber.   Controlling Sodium/Reading Food Labels: -Group verbal and written material supporting the discussion of sodium use in heart healthy nutrition. Review and explanation with models, verbal and written materials for utilization of the food label.   Exercise Physiology & Risk Factors: - Group verbal and written instruction with models to review the exercise physiology of the cardiovascular  system and associated critical values. Details cardiovascular disease risk factors and the goals associated with each risk factor.   Aerobic Exercise & Resistance Training: - Gives group verbal and written discussion on the health impact of inactivity. On the components of aerobic and resistive training programs and the benefits of this training and how to safely progress through these programs.   Flexibility, Balance, General Exercise Guidelines: - Provides group verbal and written instruction on the benefits of flexibility and balance training programs. Provides general exercise guidelines with specific guidelines to those with heart or lung disease. Demonstration and skill practice provided.   Stress Management: - Provides group verbal and written instruction about the health risks of elevated stress, cause of high stress, and healthy ways to reduce stress.   Depression: - Provides group verbal and written instruction on the correlation between heart/lung disease and depressed mood, treatment options, and the stigmas associated with seeking treatment.   Anatomy & Physiology of the Heart: - Group verbal and written instruction and models provide basic cardiac anatomy and physiology, with the coronary electrical and arterial systems. Review of: AMI, Angina, Valve disease, Heart Failure, Cardiac Arrhythmia, Pacemakers, and the ICD.   Cardiac Procedures: - Group verbal and written instruction and models to describe the testing methods done to diagnose heart disease. Reviews the outcomes of the test results. Describes the treatment choices: Medical Management, Angioplasty, or Coronary Bypass Surgery.   Cardiac Medications: - Group verbal and written instruction to review commonly prescribed medications for heart disease. Reviews the medication, class of the drug, and side effects. Includes the steps to properly store meds and maintain the prescription regimen.   Go Sex-Intimacy & Heart  Disease, Get SMART - Goal Setting: - Group verbal and written instruction through game format to discuss heart disease and the return to sexual intimacy. Provides group verbal and written material to discuss and apply goal setting through the application of the S.M.A.R.T. Method.   Other Matters of the Heart: - Provides group verbal, written materials and models to describe Heart Failure, Angina, Valve Disease, and Diabetes in the realm of heart disease. Includes description of the disease process and treatment options available to the cardiac patient.   Exercise & Equipment Safety: - Individual verbal instruction and demonstration of equipment use and safety with use of the equipment.          Cardiac Rehab from 03/19/2016 in [facility] Cardiac and Pulmonary Rehab   Date  03/15/16   Educator  C. EnterkinRN   Instruction Review Code  2- meets goals/outcomes      Infection Prevention: - Provides verbal and written material to individual with discussion of infection control including proper hand washing and proper equipment cleaning during exercise session.      Cardiac Rehab from 03/19/2016 in [facility] Cardiac and Pulmonary Rehab   Date  03/15/16   Educator  C. EnterkinRN  Instruction Review Code  2- meets goals/outcomes      Falls Prevention: - Provides verbal and written material to individual with discussion of falls prevention and safety.      Cardiac Rehab from 03/19/2016 in [facility] Cardiac and Pulmonary Rehab   Date  03/15/16   Educator  C. Nondalton   Instruction Review Code  2- meets goals/outcomes      Diabetes: - Individual verbal and written instruction to review signs/symptoms of diabetes, desired ranges of glucose level fasting, after meals and with exercise. Advice that pre and post exercise glucose checks will be done for 3 sessions at entry of program.    Knowledge Questionnaire Score:     Knowledge Questionnaire Score - 03/15/16 1434    Knowledge Questionnaire Score    Pre Score 23      Core Components/Risk Factors/Patient Goals at Admission:     Personal Goals and Risk Factors at Admission - 03/15/16 1435    Core Components/Risk Factors/Patient Goals on Admission   Tobacco Cessation Yes   Intervention Assist the participant in steps to quit. Provide individualized education and counseling about committing to Tobacco Cessation, relapse prevention, and pharmacological support that can be provided by physician.;Advice worker, assist with locating and accessing local/national Quit Smoking programs, and support quit date choice.   Expected Outcomes Short Term: Will demonstrate readiness to quit, by selecting a quit date.;Long Term: Complete abstinence from all tobacco products for at least 12 months from quit date.;Short Term: Will quit all tobacco product use, adhering to prevention of relapse plan.   Heart Failure Yes   Intervention Provide a combined exercise and nutrition program that is supplemented with education, support and counseling about heart failure. Directed toward relieving symptoms such as shortness of breath, decreased exercise tolerance, and extremity edema.   Expected Outcomes Improve functional capacity of life;Short term: Attendance in program 2-3 days a week with increased exercise capacity. Reported lower sodium intake. Reported increased fruit and vegetable intake. Reports medication compliance.;Short term: Daily weights obtained and reported for increase. Utilizing diuretic protocols set by physician.;Long term: Adoption of self-care skills and reduction of barriers for early signs and symptoms recognition and intervention leading to self-care maintenance.      Core Components/Risk Factors/Patient Goals Review:      Goals and Risk Factor Review      04/05/16 1613           Core Components/Risk Factors/Patient Goals Review   Review I left a vm to check on [place] and asked her to call us back and let us know either  way what her plans on. She has not been here in Cardiac Rehab in a while          Core Components/Risk Factors/Patient Goals at Discharge (Final Review):      Goals and Risk Factor Review - 04/05/16 1613    Core Components/Risk Factors/Patient Goals Review   Review I left a vm to check on [place] and asked her to call us back and let us know either way what her plans on. She has not been here in Cardiac Rehab in a while      ITP Comments:     ITP Comments      03/15/16 1522 03/18/16 1135 04/05/16 1612 04/11/16 1013 04/11/16 1016   ITP Comments Benjamine Mola said she feels comfortable with her Life Vest since she has worn it for 1 year now.  30 day review. Continue with ITP. New to program  Orientation only ,  to start week of 5/22 I left a vm to check on Wann and asked her to call us back and let us know either way what her plans on.  I called Jamelle's phone number and housemate said Zinia is at work but she will be home by 2:30pm. I called to see if she was coming back to Cardiac Rehab.  Tinlee did not attend Cardiac Rehab today.      04/12/16 1358 04/18/16 0937         ITP Comments Shikira has not attended cardiac rehab since 03/19/16. 30 day review.  Continue with ITP   remains  out.          Comments:

## 2016-04-18 NOTE — Telephone Encounter (Signed)
Called to check on status to return to program. LMOM 

## 2016-04-30 ENCOUNTER — Encounter: Payer: BLUE CROSS/BLUE SHIELD | Attending: Cardiovascular Disease

## 2016-04-30 DIAGNOSIS — I5042 Chronic combined systolic (congestive) and diastolic (congestive) heart failure: Secondary | ICD-10-CM | POA: Insufficient documentation

## 2016-05-08 DIAGNOSIS — I5042 Chronic combined systolic (congestive) and diastolic (congestive) heart failure: Secondary | ICD-10-CM

## 2016-05-08 NOTE — Progress Notes (Signed)
Jasmia has not been to Heart Track since 03/19/16.  Foster Simpson left a message on machine 04/18/16.

## 2016-05-16 ENCOUNTER — Encounter: Payer: Self-pay | Admitting: *Deleted

## 2016-05-16 DIAGNOSIS — I5042 Chronic combined systolic (congestive) and diastolic (congestive) heart failure: Secondary | ICD-10-CM

## 2016-05-16 NOTE — Progress Notes (Signed)
Cardiac Individual Treatment Plan  Patient Details  Name: Margaret Hendricks MRN: [id number] Date of Birth: [birth year] Referring Provider:        Cardiac Rehab from 03/15/2016 in [facility] Cardiac and Pulmonary Rehab   Referring Provider  Neoma Laming, MD      Initial Encounter Date:       Cardiac Rehab from 03/15/2016 in [facility] Cardiac and Pulmonary Rehab   Date  03/15/16   Referring Provider  Neoma Laming, MD      Visit Diagnosis: Chronic combined systolic and diastolic congestive heart failure ([place])  Patient's Home Medications on Admission:  Current outpatient prescriptions:  .  albuterol (PROVENTIL HFA;VENTOLIN HFA) 108 (90 BASE) MCG/ACT inhaler, Inhale 2 puffs into the lungs every 6 (six) hours as needed for wheezing., Disp: , Rfl:  .  buPROPion (ZYBAN) 150 MG 12 hr tablet, Take 150 mg by mouth daily., Disp: , Rfl:  .  carvedilol (COREG) 6.25 MG tablet, Take 1 tablet (6.25 mg total) by mouth 2 (two) times daily with a meal. (Patient taking differently: Take 25 mg by mouth 2 (two) times daily. ), Disp: 30 tablet, Rfl: 0 .  digoxin (LANOXIN) 0.125 MG tablet, Take 0.125 mg by mouth daily., Disp: , Rfl:  .  fluticasone (FLOVENT HFA) 220 MCG/ACT inhaler, Inhale 1 puff into the lungs 2 (two) times daily., Disp: , Rfl:  .  furosemide (LASIX) 20 MG tablet, Take 20 mg by mouth daily., Disp: , Rfl:  .  ibuprofen (ADVIL,MOTRIN) 200 MG tablet, Take 600 mg by mouth every 6 (six) hours as needed for mild pain., Disp: , Rfl:  .  Ipratropium-Albuterol (COMBIVENT) 20-100 MCG/ACT AERS respimat, Inhale 1 puff into the lungs 4 (four) times daily., Disp: 1 Inhaler, Rfl: 0 .  Multiple Vitamin (MULTIVITAMIN WITH MINERALS) TABS tablet, Take 1 tablet by mouth daily., Disp: , Rfl:  .  sacubitril-valsartan (ENTRESTO) 24-26 MG, Take 1 tablet by mouth 2 (two) times daily. (Patient taking differently: Take 97-103 tablets by mouth 2 (two) times daily. ), Disp: 60 tablet, Rfl: 0 .  spironolactone (ALDACTONE) 25 MG  tablet, Take 1 tablet (25 mg total) by mouth daily., Disp: 30 tablet, Rfl: 0  Past Medical History: Past Medical History  Diagnosis Date  . COPD (chronic obstructive pulmonary disease) (HCC)     Tobacco Use: History  Smoking status  . Current Every Day Smoker  . Last Attempt to Quit: 01/28/2015  Smokeless tobacco  . Not on file    Comment: Margaret Hendricks admits she smokes 1/2 cigarette in am on the way to work and 1/2 cigarette on the way home from work.     Labs: Recent Review Flowsheet Data    Labs for ITP Cardiac and Pulmonary Rehab Latest Ref Rng 03/14/2015   Cholestrol 0 - 200 mg/dL 126   LDLCALC 0 - 99 mg/dL 56   HDL >40 mg/dL 47   Trlycerides <150 mg/dL 114       Exercise Target Goals:    Exercise Program Goal: Individual exercise prescription set with THRR, safety & activity barriers. Participant demonstrates ability to understand and report RPE using BORG scale, to self-measure pulse accurately, and to acknowledge the importance of the exercise prescription.  Exercise Prescription Goal: Starting with aerobic activity 30 plus minutes a day, 3 days per week for initial exercise prescription. Provide home exercise prescription and guidelines that participant acknowledges understanding prior to discharge.  Activity Barriers & Risk Stratification:   6 Minute Walk:   Initial Exercise Prescription:  Perform Capillary Blood Glucose checks as needed.  Exercise Prescription Changes:   Exercise Comments:     Exercise Comments      04/12/16 1358           Exercise Comments Margaret Hendricks has not attended since 03/19/16.          Discharge Exercise Prescription (Final Exercise Prescription Changes):   Nutrition:  Target Goals: Understanding of nutrition guidelines, daily intake of sodium <1514m, cholesterol <2056m calories 30% from fat and 7% or less from saturated fats, daily to have 5 or more servings of fruits and vegetables.  Biometrics:    Nutrition  Therapy Plan and Nutrition Goals:   Nutrition Discharge: Rate Your Plate Scores:   Nutrition Goals Re-Evaluation:   Psychosocial: Target Goals: Acknowledge presence or absence of depression, maximize coping skills, provide positive support system. Participant is able to verbalize types and ability to use techniques and skills needed for reducing stress and depression.  Initial Review & Psychosocial Screening:   Quality of Life Scores:   PHQ-9:     Recent Review Flowsheet Data    [facility]   Decreased Interest 0   Down, Depressed, Hopeless 0   PHQ - 2 Score 0   Altered sleeping 3   Tired, decreased energy 1   Change in appetite 0   Feeling bad or failure about yourself  0   Trouble concentrating 0   Moving slowly or fidgety/restless 0   Suicidal thoughts 0   PHQ-9 Score 4   Difficult doing work/chores Somewhat difficult      Psychosocial Evaluation and Intervention:     Psychosocial Evaluation - 03/19/16 1729    Psychosocial Evaluation & Interventions   Interventions Encouraged to exercise with the program and follow exercise prescription;Relaxation education;Stress management education   Comments Counselor met with Margaret Hendricks today for initial psychosocial evaluation.  She is a [age]ear old who suffers from Congestive Heart Failure that was diagnosed about a year ago.  Margaret Hendricks she has a strong support system with a roommate and her parents and sisters who  live close by.  She reports sleeping well and having a good appetite.  Margaret Hendricks a history of depression or anxiety and states her current mood is typically positive most of the time.  She has some financial stress as well as being concerned about her health.  Margaret Hendricks [place] back [place], so that has been a little stressful as well.  Her goals for this program are to increase her stamina and strength and lose some weight.   She plans to continue walking her dog 3-4 days per week when not attending this program..      Psychosocial Re-Evaluation:   Vocational Rehabilitation: Provide vocational rehab assistance to qualifying candidates.   Vocational Rehab Evaluation & Intervention:   Education: Education Goals: Education classes will be provided on a weekly basis, covering required topics. Participant will state understanding/return demonstration of topics presented.  Learning Barriers/Preferences:   Education Topics: General Nutrition Guidelines/Fats and Fiber: -Group instruction provided by verbal, written material, models and posters to present the general guidelines for heart healthy nutrition. Gives an explanation and review of dietary fats and fiber.   Controlling Sodium/Reading Food Labels: -Group verbal and written material supporting the discussion of sodium use in heart healthy nutrition. Review and explanation with models, verbal and written materials for utilization of the food label.  Exercise Physiology & Risk Factors: - Group verbal and written instruction with models to review the exercise physiology of the cardiovascular system and associated critical values. Details cardiovascular disease risk factors and the goals associated with each risk factor.   Aerobic Exercise & Resistance Training: - Gives group verbal and written discussion on the health impact of inactivity. On the components of aerobic and resistive training programs and the benefits of this training and how to safely progress through these programs.   Flexibility, Balance, General Exercise Guidelines: - Provides group verbal and written instruction on the benefits of flexibility and balance training programs. Provides general exercise guidelines with specific guidelines to those with heart or lung disease. Demonstration and skill practice provided.   Stress Management: - Provides group verbal and written instruction  about the health risks of elevated stress, cause of high stress, and healthy ways to reduce stress.   Depression: - Provides group verbal and written instruction on the correlation between heart/lung disease and depressed mood, treatment options, and the stigmas associated with seeking treatment.   Anatomy & Physiology of the Heart: - Group verbal and written instruction and models provide basic cardiac anatomy and physiology, with the coronary electrical and arterial systems. Review of: AMI, Angina, Valve disease, Heart Failure, Cardiac Arrhythmia, Pacemakers, and the ICD.   Cardiac Procedures: - Group verbal and written instruction and models to describe the testing methods done to diagnose heart disease. Reviews the outcomes of the test results. Describes the treatment choices: Medical Management, Angioplasty, or Coronary Bypass Surgery.   Cardiac Medications: - Group verbal and written instruction to review commonly prescribed medications for heart disease. Reviews the medication, class of the drug, and side effects. Includes the steps to properly store meds and maintain the prescription regimen.   Go Sex-Intimacy & Heart Disease, Get SMART - Goal Setting: - Group verbal and written instruction through game format to discuss heart disease and the return to sexual intimacy. Provides group verbal and written material to discuss and apply goal setting through the application of the S.M.A.R.T. Method.   Other Matters of the Heart: - Provides group verbal, written materials and models to describe Heart Failure, Angina, Valve Disease, and Diabetes in the realm of heart disease. Includes description of the disease process and treatment options available to the cardiac patient.   Exercise & Equipment Safety: - Individual verbal instruction and demonstration of equipment use and safety with use of the equipment.          Cardiac Rehab from 03/19/2016 in [facility] Cardiac and Pulmonary Rehab    Date  03/15/16   Educator  C. EnterkinRN   Instruction Review Code  2- meets goals/outcomes      Infection Prevention: - Provides verbal and written material to individual with discussion of infection control including proper hand washing and proper equipment cleaning during exercise session.      Cardiac Rehab from 03/19/2016 in [facility] Cardiac and Pulmonary Rehab   Date  03/15/16   Educator  C. EnterkinRN   Instruction Review Code  2- meets goals/outcomes      Falls Prevention: - Provides verbal and written material to individual with discussion of falls prevention and safety.      Cardiac Rehab from 03/19/2016 in [facility] Cardiac and Pulmonary Rehab   Date  03/15/16   Educator  C. EnterkinRN   Instruction Review Code  2- meets goals/outcomes      Diabetes: - Individual verbal and written instruction to review signs/symptoms of diabetes,  desired ranges of glucose level fasting, after meals and with exercise. Advice that pre and post exercise glucose checks will be done for 3 sessions at entry of program.    Knowledge Questionnaire Score:   Core Components/Risk Factors/Patient Goals at Admission:   Core Components/Risk Factors/Patient Goals Review:      Goals and Risk Factor Review      04/05/16 1613           Core Components/Risk Factors/Patient Goals Review   Review I left a vm to check on Margaret Hendricks and asked her to call us back and let us know either way what her plans on. She has not been here in Cardiac Rehab in a while          Core Components/Risk Factors/Patient Goals at Discharge (Final Review):      Goals and Risk Factor Review - 04/05/16 1613    Core Components/Risk Factors/Patient Goals Review   Review I left a vm to check on Margaret Hendricks and asked her to call us back and let us know either way what her plans on. She has not been here in Cardiac Rehab in a while      ITP Comments:     ITP Comments      03/18/16 1135 04/05/16 1612 04/11/16 1013 04/11/16  1016 04/12/16 1358   ITP Comments 30 day review. Continue with ITP. New to program  Orientation only , to start week of 5/22 I left a vm to check on Margaret Hendricks and asked her to call us back and let us know either way what her plans on.  I called Margaret Hendricks's phone number and housemate said Margaret Hendricks is at work but she will be home by 2:30pm. I called to see if she was coming back to Cardiac Rehab.  Margaret Hendricks did not attend Cardiac Rehab today.  Tamma has not attended cardiac rehab since 03/19/16.     04/18/16 7017 05/08/16 1543 05/16/16 0801       ITP Comments 30 day review.  Continue with ITP   remains  out.  Gwyneth has not been to Heart Track since 03/19/16.  Foster Simpson left a message on machine 04/18/16. 30 day review. Continue with ITP remains out        Comments:

## 2016-05-24 ENCOUNTER — Telehealth: Payer: Self-pay | Admitting: *Deleted

## 2016-05-24 NOTE — Telephone Encounter (Signed)
Called to check on return to the program. [facility]

## 2016-05-30 ENCOUNTER — Encounter: Payer: BLUE CROSS/BLUE SHIELD | Attending: Cardiovascular Disease

## 2016-05-30 DIAGNOSIS — I5042 Chronic combined systolic (congestive) and diastolic (congestive) heart failure: Secondary | ICD-10-CM | POA: Insufficient documentation

## 2016-06-04 ENCOUNTER — Telehealth: Payer: Self-pay | Admitting: *Deleted

## 2016-06-04 NOTE — Telephone Encounter (Signed)
To check in return to program [facility]

## 2016-06-05 ENCOUNTER — Telehealth: Payer: Self-pay | Admitting: Internal Medicine

## 2016-06-05 NOTE — Telephone Encounter (Signed)
Lmov to call back and schedule an appointment  Received referral from [facility]  Notes on file

## 2016-06-07 DIAGNOSIS — I5042 Chronic combined systolic (congestive) and diastolic (congestive) heart failure: Secondary | ICD-10-CM

## 2016-06-12 NOTE — Telephone Encounter (Signed)
Called patient and she is coming on 07/03/16 to see Dr Yvone Neu Notes are on file

## 2016-06-13 ENCOUNTER — Encounter: Payer: Self-pay | Admitting: *Deleted

## 2016-06-13 DIAGNOSIS — I5042 Chronic combined systolic (congestive) and diastolic (congestive) heart failure: Secondary | ICD-10-CM

## 2016-06-13 NOTE — Progress Notes (Signed)
Cardiac Individual Treatment Plan  Patient Details  Name: Margaret Hendricks MRN: [id number] Date of Birth: 01-19-1956 Referring Provider:   Flowsheet Row Cardiac Rehab from 03/15/2016 in [facility] Cardiac and Pulmonary Rehab  Referring Provider  Neoma Laming, MD      Initial Encounter Date:  Flowsheet Row Cardiac Rehab from 03/15/2016 in [facility] Cardiac and Pulmonary Rehab  Date  03/15/16  Referring Provider  Neoma Laming, MD      Visit Diagnosis: Chronic combined systolic and diastolic congestive heart failure ([place])  Patient's Home Medications on Admission:  Current Outpatient Prescriptions:  .  albuterol (PROVENTIL HFA;VENTOLIN HFA) 108 (90 BASE) MCG/ACT inhaler, Inhale 2 puffs into the lungs every 6 (six) hours as needed for wheezing., Disp: , Rfl:  .  buPROPion (ZYBAN) 150 MG 12 hr tablet, Take 150 mg by mouth daily., Disp: , Rfl:  .  carvedilol (COREG) 6.25 MG tablet, Take 1 tablet (6.25 mg total) by mouth 2 (two) times daily with a meal. (Patient taking differently: Take 25 mg by mouth 2 (two) times daily. ), Disp: 30 tablet, Rfl: 0 .  digoxin (LANOXIN) 0.125 MG tablet, Take 0.125 mg by mouth daily., Disp: , Rfl:  .  fluticasone (FLOVENT HFA) 220 MCG/ACT inhaler, Inhale 1 puff into the lungs 2 (two) times daily., Disp: , Rfl:  .  furosemide (LASIX) 20 MG tablet, Take 20 mg by mouth daily., Disp: , Rfl:  .  ibuprofen (ADVIL,MOTRIN) 200 MG tablet, Take 600 mg by mouth every 6 (six) hours as needed for mild pain., Disp: , Rfl:  .  Ipratropium-Albuterol (COMBIVENT) 20-100 MCG/ACT AERS respimat, Inhale 1 puff into the lungs 4 (four) times daily., Disp: 1 Inhaler, Rfl: 0 .  Multiple Vitamin (MULTIVITAMIN WITH MINERALS) TABS tablet, Take 1 tablet by mouth daily., Disp: , Rfl:  .  sacubitril-valsartan (ENTRESTO) 24-26 MG, Take 1 tablet by mouth 2 (two) times daily. (Patient taking differently: Take 97-103 tablets by mouth 2 (two) times daily. ), Disp: 60 tablet, Rfl: 0 .  spironolactone  (ALDACTONE) 25 MG tablet, Take 1 tablet (25 mg total) by mouth daily., Disp: 30 tablet, Rfl: 0  Past Medical History: Past Medical History:  Diagnosis Date  . COPD (chronic obstructive pulmonary disease) (HCC)     Tobacco Use: History  Smoking Status  . Current Every Day Smoker  . Last attempt to quit: 01/28/2015  Smokeless Tobacco  . Not on file    Comment: Jameah admits she smokes 1/2 cigarette in am on the way to work and 1/2 cigarette on the way home from work.     Labs: Recent Review Flowsheet Data    Labs for ITP Cardiac and Pulmonary Rehab Latest Ref Rng & Units 03/14/2015   Cholestrol 0 - 200 mg/dL 126   LDLCALC 0 - 99 mg/dL 56   HDL >40 mg/dL 47   Trlycerides <150 mg/dL 114       Exercise Target Goals:    Exercise Program Goal: Individual exercise prescription set with THRR, safety & activity barriers. Participant demonstrates ability to understand and report RPE using BORG scale, to self-measure pulse accurately, and to acknowledge the importance of the exercise prescription.  Exercise Prescription Goal: Starting with aerobic activity 30 plus minutes a day, 3 days per week for initial exercise prescription. Provide home exercise prescription and guidelines that participant acknowledges understanding prior to discharge.  Activity Barriers & Risk Stratification:   6 Minute Walk:   Initial Exercise Prescription:   Perform Capillary Blood Glucose checks as  needed.  Exercise Prescription Changes:   Exercise Comments:     Exercise Comments    Row Name 06/07/16 1529           Exercise Comments Landrey has not attended since 03/19/16.          Discharge Exercise Prescription (Final Exercise Prescription Changes):   Nutrition:  Target Goals: Understanding of nutrition guidelines, daily intake of sodium 1500mg , cholesterol 200mg , calories 30% from fat and 7% or less from saturated fats, daily to have 5 or more servings of fruits and  vegetables.  Biometrics:    Nutrition Therapy Plan and Nutrition Goals:   Nutrition Discharge: Rate Your Plate Scores:   Nutrition Goals Re-Evaluation:   Psychosocial: Target Goals: Acknowledge presence or absence of depression, maximize coping skills, provide positive support system. Participant is able to verbalize types and ability to use techniques and skills needed for reducing stress and depression.  Initial Review & Psychosocial Screening:   Quality of Life Scores:   PHQ-9: Recent Review Flowsheet Data    Depression screen [facility] 2/9 03/15/2016   Decreased Interest 0   Down, Depressed, Hopeless 0   PHQ - 2 Score 0   Altered sleeping 3   Tired, decreased energy 1   Change in appetite 0   Feeling bad or failure about yourself  0   Trouble concentrating 0   Moving slowly or fidgety/restless 0   Suicidal thoughts 0   PHQ-9 Score 4   Difficult doing work/chores Somewhat difficult      Psychosocial Evaluation and Intervention:   Psychosocial Re-Evaluation:   Vocational Rehabilitation: Provide vocational rehab assistance to qualifying candidates.   Vocational Rehab Evaluation & Intervention:   Education: Education Goals: Education classes will be provided on a weekly basis, covering required topics. Participant will state understanding/return demonstration of topics presented.  Learning Barriers/Preferences:   Education Topics: General Nutrition Guidelines/Fats and Fiber: -Group instruction provided by verbal, written material, models and posters to present the general guidelines for heart healthy nutrition. Gives an explanation and review of dietary fats and fiber.   Controlling Sodium/Reading Food Labels: -Group verbal and written material supporting the discussion of sodium use in heart healthy nutrition. Review and explanation with models, verbal and written materials for utilization of the food label.   Exercise Physiology & Risk Factors: - Group  verbal and written instruction with models to review the exercise physiology of the cardiovascular system and associated critical values. Details cardiovascular disease risk factors and the goals associated with each risk factor.   Aerobic Exercise & Resistance Training: - Gives group verbal and written discussion on the health impact of inactivity. On the components of aerobic and resistive training programs and the benefits of this training and how to safely progress through these programs.   Flexibility, Balance, General Exercise Guidelines: - Provides group verbal and written instruction on the benefits of flexibility and balance training programs. Provides general exercise guidelines with specific guidelines to those with heart or lung disease. Demonstration and skill practice provided.   Stress Management: - Provides group verbal and written instruction about the health risks of elevated stress, cause of high stress, and healthy ways to reduce stress.   Depression: - Provides group verbal and written instruction on the correlation between heart/lung disease and depressed mood, treatment options, and the stigmas associated with seeking treatment.   Anatomy & Physiology of the Heart: - Group verbal and written instruction and models provide basic cardiac anatomy and physiology, with the coronary electrical  and arterial systems. Review of: AMI, Angina, Valve disease, Heart Failure, Cardiac Arrhythmia, Pacemakers, and the ICD.   Cardiac Procedures: - Group verbal and written instruction and models to describe the testing methods done to diagnose heart disease. Reviews the outcomes of the test results. Describes the treatment choices: Medical Management, Angioplasty, or Coronary Bypass Surgery.   Cardiac Medications: - Group verbal and written instruction to review commonly prescribed medications for heart disease. Reviews the medication, class of the drug, and side effects. Includes the  steps to properly store meds and maintain the prescription regimen.   Go Sex-Intimacy & Heart Disease, Get SMART - Goal Setting: - Group verbal and written instruction through game format to discuss heart disease and the return to sexual intimacy. Provides group verbal and written material to discuss and apply goal setting through the application of the S.M.A.R.T. Method.   Other Matters of the Heart: - Provides group verbal, written materials and models to describe Heart Failure, Angina, Valve Disease, and Diabetes in the realm of heart disease. Includes description of the disease process and treatment options available to the cardiac patient.   Exercise & Equipment Safety: - Individual verbal instruction and demonstration of equipment use and safety with use of the equipment. Flowsheet Row Cardiac Rehab from 03/19/2016 in [facility] Cardiac and Pulmonary Rehab  Date  03/15/16  Educator  C. EnterkinRN  Instruction Review Code  2- meets goals/outcomes      Infection Prevention: - Provides verbal and written material to individual with discussion of infection control including proper hand washing and proper equipment cleaning during exercise session. Flowsheet Row Cardiac Rehab from 03/19/2016 in [facility] Cardiac and Pulmonary Rehab  Date  03/15/16  Educator  C. EnterkinRN  Instruction Review Code  2- meets goals/outcomes      Falls Prevention: - Provides verbal and written material to individual with discussion of falls prevention and safety. Flowsheet Row Cardiac Rehab from 03/19/2016 in [facility] Cardiac and Pulmonary Rehab  Date  03/15/16  Educator  C. Hardin  Instruction Review Code  2- meets goals/outcomes      Diabetes: - Individual verbal and written instruction to review signs/symptoms of diabetes, desired ranges of glucose level fasting, after meals and with exercise. Advice that pre and post exercise glucose checks will be done for 3 sessions at entry of program.    Knowledge  Questionnaire Score:   Core Components/Risk Factors/Patient Goals at Admission:   Core Components/Risk Factors/Patient Goals Review:    Core Components/Risk Factors/Patient Goals at Discharge (Final Review):    ITP Comments:     ITP Comments    Row Name 04/18/16 0937 05/08/16 1543 05/16/16 0801 06/13/16 0829     ITP Comments 30 day review.  Continue with ITP   remains  out.  Markeia has not been to Heart Track since 03/19/16.  Foster Simpson left a message on machine 04/18/16. 30 day review. Continue with ITP remains out 30 day review. Continue with ITP unless changes noted by Medical Director at signature of review.  Remains out of program 3 visits since med reivew on 5/18       Comments:

## 2016-06-15 ENCOUNTER — Encounter: Payer: Self-pay | Admitting: Dietician

## 2016-06-20 DIAGNOSIS — I5042 Chronic combined systolic (congestive) and diastolic (congestive) heart failure: Secondary | ICD-10-CM

## 2016-06-20 NOTE — Progress Notes (Signed)
Cardiac Individual Treatment Plan  Patient Details  Name: Margaret Hendricks MRN: [id number] Date of Birth: 05/26/1956 Referring Provider:   Flowsheet Row Cardiac Rehab from 03/15/2016 in [facility] Cardiac and Pulmonary Rehab  Referring Provider  Neoma Laming, MD      Initial Encounter Date:  Flowsheet Row Cardiac Rehab from 03/15/2016 in [facility] Cardiac and Pulmonary Rehab  Date  03/15/16  Referring Provider  Neoma Laming, MD      Visit Diagnosis: Chronic combined systolic and diastolic congestive heart failure ([place])  Patient's Home Medications on Admission:  Current Outpatient Prescriptions:  .  albuterol (PROVENTIL HFA;VENTOLIN HFA) 108 (90 BASE) MCG/ACT inhaler, Inhale 2 puffs into the lungs every 6 (six) hours as needed for wheezing., Disp: , Rfl:  .  buPROPion (ZYBAN) 150 MG 12 hr tablet, Take 150 mg by mouth daily., Disp: , Rfl:  .  carvedilol (COREG) 6.25 MG tablet, Take 1 tablet (6.25 mg total) by mouth 2 (two) times daily with a meal. (Patient taking differently: Take 25 mg by mouth 2 (two) times daily. ), Disp: 30 tablet, Rfl: 0 .  digoxin (LANOXIN) 0.125 MG tablet, Take 0.125 mg by mouth daily., Disp: , Rfl:  .  fluticasone (FLOVENT HFA) 220 MCG/ACT inhaler, Inhale 1 puff into the lungs 2 (two) times daily., Disp: , Rfl:  .  furosemide (LASIX) 20 MG tablet, Take 20 mg by mouth daily., Disp: , Rfl:  .  ibuprofen (ADVIL,MOTRIN) 200 MG tablet, Take 600 mg by mouth every 6 (six) hours as needed for mild pain., Disp: , Rfl:  .  Ipratropium-Albuterol (COMBIVENT) 20-100 MCG/ACT AERS respimat, Inhale 1 puff into the lungs 4 (four) times daily., Disp: 1 Inhaler, Rfl: 0 .  Multiple Vitamin (MULTIVITAMIN WITH MINERALS) TABS tablet, Take 1 tablet by mouth daily., Disp: , Rfl:  .  sacubitril-valsartan (ENTRESTO) 24-26 MG, Take 1 tablet by mouth 2 (two) times daily. (Patient taking differently: Take 97-103 tablets by mouth 2 (two) times daily. ), Disp: 60 tablet, Rfl: 0 .  spironolactone  (ALDACTONE) 25 MG tablet, Take 1 tablet (25 mg total) by mouth daily., Disp: 30 tablet, Rfl: 0  Past Medical History: Past Medical History:  Diagnosis Date  . COPD (chronic obstructive pulmonary disease) (HCC)     Tobacco Use: History  Smoking Status  . Current Every Day Smoker  . Last attempt to quit: 01/28/2015  Smokeless Tobacco  . Not on file    Comment: Bonnielou admits she smokes 1/2 cigarette in am on the way to work and 1/2 cigarette on the way home from work.     Labs: Recent Review Flowsheet Data    Labs for ITP Cardiac and Pulmonary Rehab Latest Ref Rng & Units 03/14/2015   Cholestrol 0 - 200 mg/dL 126   LDLCALC 0 - 99 mg/dL 56   HDL >40 mg/dL 47   Trlycerides <150 mg/dL 114       Exercise Target Goals:    Exercise Program Goal: Individual exercise prescription set with THRR, safety & activity barriers. Participant demonstrates ability to understand and report RPE using BORG scale, to self-measure pulse accurately, and to acknowledge the importance of the exercise prescription.  Exercise Prescription Goal: Starting with aerobic activity 30 plus minutes a day, 3 days per week for initial exercise prescription. Provide home exercise prescription and guidelines that participant acknowledges understanding prior to discharge.  Activity Barriers & Risk Stratification:   6 Minute Walk:   Initial Exercise Prescription:   Perform Capillary Blood Glucose checks as  needed.  Exercise Prescription Changes:   Exercise Comments:     Exercise Comments    Row Name 06/07/16 1529           Exercise Comments Damya has not attended since 03/19/16.          Discharge Exercise Prescription (Final Exercise Prescription Changes):   Nutrition:  Target Goals: Understanding of nutrition guidelines, daily intake of sodium 1500mg , cholesterol 200mg , calories 30% from fat and 7% or less from saturated fats, daily to have 5 or more servings of fruits and  vegetables.  Biometrics:    Nutrition Therapy Plan and Nutrition Goals:   Nutrition Discharge: Rate Your Plate Scores:     Nutrition Assessments - 06/15/16 1443      Rate Your Plate Scores   Pre Score 64   Pre Score % 71 %      Nutrition Goals Re-Evaluation:   Psychosocial: Target Goals: Acknowledge presence or absence of depression, maximize coping skills, provide positive support system. Participant is able to verbalize types and ability to use techniques and skills needed for reducing stress and depression.  Initial Review & Psychosocial Screening:   Quality of Life Scores:   PHQ-9: Recent Review Flowsheet Data    Depression screen [facility] 2/9 03/15/2016   Decreased Interest 0   Down, Depressed, Hopeless 0   PHQ - 2 Score 0   Altered sleeping 3   Tired, decreased energy 1   Change in appetite 0   Feeling bad or failure about yourself  0   Trouble concentrating 0   Moving slowly or fidgety/restless 0   Suicidal thoughts 0   PHQ-9 Score 4   Difficult doing work/chores Somewhat difficult      Psychosocial Evaluation and Intervention:   Psychosocial Re-Evaluation:   Vocational Rehabilitation: Provide vocational rehab assistance to qualifying candidates.   Vocational Rehab Evaluation & Intervention:   Education: Education Goals: Education classes will be provided on a weekly basis, covering required topics. Participant will state understanding/return demonstration of topics presented.  Learning Barriers/Preferences:   Education Topics: General Nutrition Guidelines/Fats and Fiber: -Group instruction provided by verbal, written material, models and posters to present the general guidelines for heart healthy nutrition. Gives an explanation and review of dietary fats and fiber.   Controlling Sodium/Reading Food Labels: -Group verbal and written material supporting the discussion of sodium use in heart healthy nutrition. Review and explanation with  models, verbal and written materials for utilization of the food label.   Exercise Physiology & Risk Factors: - Group verbal and written instruction with models to review the exercise physiology of the cardiovascular system and associated critical values. Details cardiovascular disease risk factors and the goals associated with each risk factor.   Aerobic Exercise & Resistance Training: - Gives group verbal and written discussion on the health impact of inactivity. On the components of aerobic and resistive training programs and the benefits of this training and how to safely progress through these programs.   Flexibility, Balance, General Exercise Guidelines: - Provides group verbal and written instruction on the benefits of flexibility and balance training programs. Provides general exercise guidelines with specific guidelines to those with heart or lung disease. Demonstration and skill practice provided.   Stress Management: - Provides group verbal and written instruction about the health risks of elevated stress, cause of high stress, and healthy ways to reduce stress.   Depression: - Provides group verbal and written instruction on the correlation between heart/lung disease and depressed mood, treatment options,  and the stigmas associated with seeking treatment.   Anatomy & Physiology of the Heart: - Group verbal and written instruction and models provide basic cardiac anatomy and physiology, with the coronary electrical and arterial systems. Review of: AMI, Angina, Valve disease, Heart Failure, Cardiac Arrhythmia, Pacemakers, and the ICD.   Cardiac Procedures: - Group verbal and written instruction and models to describe the testing methods done to diagnose heart disease. Reviews the outcomes of the test results. Describes the treatment choices: Medical Management, Angioplasty, or Coronary Bypass Surgery.   Cardiac Medications: - Group verbal and written instruction to review  commonly prescribed medications for heart disease. Reviews the medication, class of the drug, and side effects. Includes the steps to properly store meds and maintain the prescription regimen.   Go Sex-Intimacy & Heart Disease, Get SMART - Goal Setting: - Group verbal and written instruction through game format to discuss heart disease and the return to sexual intimacy. Provides group verbal and written material to discuss and apply goal setting through the application of the S.M.A.R.T. Method.   Other Matters of the Heart: - Provides group verbal, written materials and models to describe Heart Failure, Angina, Valve Disease, and Diabetes in the realm of heart disease. Includes description of the disease process and treatment options available to the cardiac patient.   Exercise & Equipment Safety: - Individual verbal instruction and demonstration of equipment use and safety with use of the equipment. Flowsheet Row Cardiac Rehab from 03/19/2016 in [facility] Cardiac and Pulmonary Rehab  Date  03/15/16  Educator  C. EnterkinRN  Instruction Review Code  2- meets goals/outcomes      Infection Prevention: - Provides verbal and written material to individual with discussion of infection control including proper hand washing and proper equipment cleaning during exercise session. Flowsheet Row Cardiac Rehab from 03/19/2016 in [facility] Cardiac and Pulmonary Rehab  Date  03/15/16  Educator  C. EnterkinRN  Instruction Review Code  2- meets goals/outcomes      Falls Prevention: - Provides verbal and written material to individual with discussion of falls prevention and safety. Flowsheet Row Cardiac Rehab from 03/19/2016 in [facility] Cardiac and Pulmonary Rehab  Date  03/15/16  Educator  C. [place]  Instruction Review Code  2- meets goals/outcomes      Diabetes: - Individual verbal and written instruction to review signs/symptoms of diabetes, desired ranges of glucose level fasting, after meals and with  exercise. Advice that pre and post exercise glucose checks will be done for 3 sessions at entry of program.    Knowledge Questionnaire Score:   Core Components/Risk Factors/Patient Goals at Admission:   Core Components/Risk Factors/Patient Goals Review:    Core Components/Risk Factors/Patient Goals at Discharge (Final Review):    ITP Comments:     ITP Comments    Row Name 05/08/16 1543 05/16/16 0801 06/13/16 0829       ITP Comments Corabelle has not been to [facility] since 03/19/16.  Foster Simpson left a message on machine 04/18/16. 30 day review. Continue with ITP remains out 30 day review. Continue with ITP unless changes noted by Medical Director at signature of review.  Remains out of program 3 visits since med reivew on 5/18        Comments: discharge itp

## 2016-06-20 NOTE — Progress Notes (Signed)
Discharge Summary  Patient Details  Name: Margaret Hendricks MRN: [id number] Date of Birth: November 25, 1955 Referring Provider:   Flowsheet Row Cardiac Rehab from 03/15/2016 in [facility] Cardiac and Pulmonary Rehab  Referring Provider  Neoma Laming, MD       Number of Visits: 2  Reason for Discharge:  Early Exit:  Personal  Smoking History:  History  Smoking Status  . Current Every Day Smoker  . Last attempt to quit: 01/28/2015  Smokeless Tobacco  . Not on file    Comment: [name] admits she smokes 1/2 cigarette in am on the way to work and 1/2 cigarette on the way home from work.     Diagnosis:  Chronic combined systolic and diastolic congestive heart failure (HCC)  ADL UCSD:   Initial Exercise Prescription:   Discharge Exercise Prescription (Final Exercise Prescription Changes):   Functional Capacity:   Psychological, QOL, Others - Outcomes: PHQ 2/9: Depression screen PHQ 2/9 03/15/2016  Decreased Interest 0  Down, Depressed, Hopeless 0  PHQ - 2 Score 0  Altered sleeping 3  Tired, decreased energy 1  Change in appetite 0  Feeling bad or failure about yourself  0  Trouble concentrating 0  Moving slowly or fidgety/restless 0  Suicidal thoughts 0  PHQ-9 Score 4  Difficult doing work/chores Somewhat difficult    Quality of Life:   Personal Goals: Goals established at orientation with interventions provided to work toward goal.    Personal Goals Discharge:   Nutrition & Weight - Outcomes:    Nutrition:   Nutrition Discharge:     Nutrition Assessments - 06/15/16 1443      Rate Your Plate Scores   Pre Score 64   Pre Score % 71 %      Education Questionnaire Score:   Goals reviewed with patient; copy given to patient.

## 2016-06-28 NOTE — Telephone Encounter (Signed)
Has been in Cardiac REhab in the past.

## 2016-07-03 ENCOUNTER — Encounter: Payer: Self-pay | Admitting: *Deleted

## 2016-07-03 ENCOUNTER — Encounter: Payer: Self-pay | Admitting: Internal Medicine

## 2016-07-03 ENCOUNTER — Ambulatory Visit (INDEPENDENT_AMBULATORY_CARE_PROVIDER_SITE_OTHER): Payer: BLUE CROSS/BLUE SHIELD | Admitting: Internal Medicine

## 2016-07-03 ENCOUNTER — Ambulatory Visit: Payer: BLUE CROSS/BLUE SHIELD | Admitting: Cardiology

## 2016-07-03 VITALS — BP 112/58 | HR 72 | Ht 63.0 in | Wt 180.2 lb

## 2016-07-03 DIAGNOSIS — R079 Chest pain, unspecified: Secondary | ICD-10-CM

## 2016-07-03 DIAGNOSIS — R0602 Shortness of breath: Secondary | ICD-10-CM

## 2016-07-03 DIAGNOSIS — I429 Cardiomyopathy, unspecified: Secondary | ICD-10-CM

## 2016-07-03 DIAGNOSIS — Z01812 Encounter for preprocedural laboratory examination: Secondary | ICD-10-CM

## 2016-07-03 DIAGNOSIS — I509 Heart failure, unspecified: Secondary | ICD-10-CM | POA: Diagnosis not present

## 2016-07-03 DIAGNOSIS — I428 Other cardiomyopathies: Secondary | ICD-10-CM | POA: Insufficient documentation

## 2016-07-03 DIAGNOSIS — I447 Left bundle-branch block, unspecified: Secondary | ICD-10-CM

## 2016-07-03 DIAGNOSIS — I5022 Chronic systolic (congestive) heart failure: Secondary | ICD-10-CM

## 2016-07-03 HISTORY — DX: Left bundle-branch block, unspecified: I44.7

## 2016-07-03 HISTORY — DX: Other cardiomyopathies: I42.8

## 2016-07-03 HISTORY — DX: Chronic systolic (congestive) heart failure: I50.22

## 2016-07-03 NOTE — Patient Instructions (Addendum)
Medication Instructions: - Your physician recommends that you continue on your current medications as directed. Please refer to the Current Medication list given to you today.  Labwork: - Your physician recommends that you have lab work today: BMP/ CBC/ INR  Procedures/Testing: - Your physician has recommended that you have a defibrillator inserted. An implantable cardioverter defibrillator (ICD) is a small device that is placed in your chest or, in rare cases, your abdomen. This device uses electrical pulses or shocks to help control life-threatening, irregular heartbeats that could lead the heart to suddenly stop beating (sudden cardiac arrest). Leads are attached to the ICD that goes into your heart. This is done in the hospital and usually requires an overnight stay. Please see the instruction sheet given to you today for more information.  Follow-Up: - Your physician recommends that you schedule a follow-up appointment: 07/31/16   with Raquel Sarna in the [facility] for a wound check  Any Additional Special Instructions Will Be Listed Below (If Applicable).     If you need a refill on your cardiac medications before your next appointment, please call your pharmacy.

## 2016-07-03 NOTE — Progress Notes (Signed)
ELECTROPHYSIOLOGY CONSULT NOTE  Patient ID: Margaret Hendricks, MRN: [id number], DOB/AGE: 1956-01-09 60 y.o. Admit date: (Not on file) Date of Consult: 07/03/2016  Primary Physician: Willa Frater, MD Primary Cardiologist: [facility] same  Chief Complaint: ICD   HPI Margaret Hendricks is a 60 y.o. female  Referred for consideration of an ICD.  She was found in 3/16 to have a nonischemic cardiomyopathy when she presented with symptoms of subacute congestive heart failure manifested by orthopnea nocturnal dyspnea and dyspnea on exertion. She has not had pedal edema. Ejection fraction was 10%. She was treated with guideline directed medical therapy with interval up titration. She was also treated with a LifeVest. She has been wearing it ever since.  Interval echo 4/17 EF 30-35% 05/1724-30%.  She's had no palpitations. She's had no syncope. She has had one false alarm from her LifeVest that occur. Her son's wedding.  There is no family history for cardiomyopathy. Her parents are still both alive without heart disease.  She is currently able to walk a mile and 30-40 minutes. She can climb a flight of stairs with some difficulty. She does not have nocturnal dyspnea orthopnea at this point or peripheral edema      Past Medical History:  Diagnosis Date  . Chronic systolic CHF (congestive heart failure), NYHA class 2- 3 ([place]) 07/03/2016  . COPD (chronic obstructive pulmonary disease) ([place])   . LBBB (left bundle branch block) 07/03/2016  . NICM (nonischemic cardiomyopathy) ([place]) 07/03/2016      Surgical History:  Past Surgical History:  Procedure Laterality Date  . APPENDECTOMY    . CARDIAC CATHETERIZATION Left 03/14/2015   Procedure: Left Heart Cath;  Surgeon: Dionisio David, MD;  Location: [facility];  Service: Cardiovascular;  Laterality: Left;  . TUBAL LIGATION Bilateral      Home Meds: Prior to Admission medications   Medication Sig Start Date End  Date Taking? Authorizing Provider  albuterol (PROVENTIL HFA;VENTOLIN HFA) 108 (90 BASE) MCG/ACT inhaler Inhale 2 puffs into the lungs every 6 (six) hours as needed for wheezing.    Historical Provider, MD  buPROPion (ZYBAN) 150 MG 12 hr tablet Take 150 mg by mouth daily.    Historical Provider, MD  carvedilol (COREG) 6.25 MG tablet Take 1 tablet (6.25 mg total) by mouth 2 (two) times daily with a meal. Patient taking differently: Take 25 mg by mouth 2 (two) times daily.  03/16/15   Epifanio Lesches, MD  digoxin (LANOXIN) 0.125 MG tablet Take 0.125 mg by mouth daily.    Historical Provider, MD  fluticasone (FLOVENT HFA) 220 MCG/ACT inhaler Inhale 1 puff into the lungs 2 (two) times daily.    Historical Provider, MD  furosemide (LASIX) 20 MG tablet Take 20 mg by mouth daily.    Historical Provider, MD  ibuprofen (ADVIL,MOTRIN) 200 MG tablet Take 600 mg by mouth every 6 (six) hours as needed for mild pain.    Historical Provider, MD  Ipratropium-Albuterol (COMBIVENT) 20-100 MCG/ACT AERS respimat Inhale 1 puff into the lungs 4 (four) times daily. 03/16/15   Epifanio Lesches, MD  Multiple Vitamin (MULTIVITAMIN WITH MINERALS) TABS tablet Take 1 tablet by mouth daily.    Historical Provider, MD  sacubitril-valsartan (ENTRESTO) 24-26 MG Take 1 tablet by mouth 2 (two) times daily. Patient taking differently: Take 97-103 tablets by mouth 2 (two) times daily.  03/16/15   Epifanio Lesches, MD  spironolactone (ALDACTONE) 25 MG tablet Take 1 tablet (25 mg total) by  mouth daily. 03/16/15   Epifanio Lesches, MD    Allergies: No Known Allergies  Social History   Social History  . Marital status: Married    Spouse name: N/A  . Number of children: N/A  . Years of education: N/A   Occupational History  . Not on file.   Social History Main Topics  . Smoking status: Current Every Day Smoker    Years: 30.00    Last attempt to quit: 01/28/2015  . Smokeless tobacco: Never Used     Comment: Saquana  admits she smokes 1/2 cigarette in am on the way to work and 1/2 cigarette on the way home from work.   . Alcohol use No  . Drug use: No  . Sexual activity: Not on file   Other Topics Concern  . Not on file   Social History Narrative  . No narrative on file     Family History  Problem Relation Age of Onset  . Hypertension    . Heart attack       ROS:  Please see the history of present illness.     All other systems reviewed and negative.    Physical Exam: Blood pressure (!) 112/58, pulse 72, height 5\' 3"  (1.6 m), weight 180 lb 4 oz (81.8 kg). General: Well developed, well nourished female in no acute distress. Head: Normocephalic, atraumatic, sclera non-icteric, no xanthomas, nares are without discharge. EENT: normal  Lymph Nodes:  none Neck: Negative for carotid bruits. JVD 6-8 Back:without scoliosis kyphosis Lungs: Clear bilaterally to auscultation without wheezes, rales, or rhonchi. Breathing is unlabored. Heart: RRR with S1 S2.  2/6 systolic  murmur . No rubs, or gallops appreciated. Abdomen: Soft, non-tender, non-distended with normoactive bowel sounds. No hepatomegaly. No rebound/guarding. No obvious abdominal masses. Msk:  Strength and tone appear normal for age. Extremities: No clubbing or cyanosis. No edema.  Distal pedal pulses are 2+ and equal bilaterally. Skin: Warm and Dry Neuro: Alert and oriented X 3. CN III-XII intact Grossly normal sensory and motor function . Psych:  Responds to questions appropriately with a normal affect.      Labs: Cardiac Enzymes No results for input(s): CKTOTAL, CKMB, TROPONINI in the last 72 hours. CBC Lab Results  Component Value Date   WBC 4.6 03/12/2015   HGB 10.6 (L) 03/12/2015   HCT 31.7 (L) 03/12/2015   MCV 89.7 03/12/2015   PLT 228 03/12/2015   PROTIME: No results for input(s): LABPROT, INR in the last 72 hours. Chemistry No results for input(s): NA, K, CL, CO2, BUN, CREATININE, CALCIUM, PROT, BILITOT, ALKPHOS, ALT,  AST, GLUCOSE in the last 168 hours.  Invalid input(s): LABALBU Lipids Lab Results  Component Value Date   CHOL 126 03/14/2015   HDL 47 03/14/2015   LDLCALC 56 03/14/2015   TRIG 114 03/14/2015   BNP No results found for: PROBNP Thyroid Function Tests: No results for input(s): TSH, T4TOTAL, T3FREE, THYROIDAB in the last 72 hours.  Invalid input(s): FREET3 Miscellaneous No results found for: DDIMER  Radiology/Studies:  No results found.  EKG:  Sinus rhythm at 72 Intervals 17/16/41 Left bundle branch block with multiple QRS deflections  Assessment and Plan:  Nonischemic cardiomyopathy  Left bundle branch block  Congestive heart failure-chronic-systolic class IIb  Anemia   The patient has persistent left ventricular dysfunction despite guidelines directed medical therapy. With her symptoms of heart failure and left bundle branch block she is an appropriate candidate for CRT. The fragmented QRS has been recently associated  with improved likelihood of benefit.  Given her young age, the context of [place], it is reasonable to proceed with CRT-D implantation  Euvolemic continue current meds  Need to check Dig level, repeat HgB    Have reviewed the potential benefits and risks of ICD implantation including but not limited to death, perforation of heart or lung, lead dislodgement, infection,  device malfunction and inappropriate shocks.  The patient and family express understanding  and are willing to proceed.       Virl Axe

## 2016-07-04 LAB — BASIC METABOLIC PANEL
BUN/Creatinine Ratio: 19 (ref 9–23)
BUN: 15 mg/dL (ref 6–24)
CALCIUM: 9 mg/dL (ref 8.7–10.2)
CHLORIDE: 102 mmol/L (ref 96–106)
CO2: 22 mmol/L (ref 18–29)
Creatinine, Ser: 0.8 mg/dL (ref 0.57–1.00)
GFR calc Af Amer: 95 mL/min/{1.73_m2} (ref >59)
GFR calc non Af Amer: 82 mL/min/{1.73_m2} (ref >59)
GLUCOSE: 179 mg/dL — AB (ref 65–99)
Potassium: 4.4 mmol/L (ref 3.5–5.2)
Sodium: 140 mmol/L (ref 134–144)

## 2016-07-04 LAB — PROTIME-INR
INR: 0.9 (ref 0.8–1.2)
PROTHROMBIN TIME: 10 s (ref 9.1–12.0)

## 2016-07-04 LAB — CBC WITH DIFFERENTIAL/PLATELET
BASOS ABS: 0 10*3/uL (ref 0.0–0.2)
Basos: 0 %
EOS (ABSOLUTE): 0.1 10*3/uL (ref 0.0–0.4)
Eos: 2 %
Hematocrit: 31 % — ABNORMAL LOW (ref 34.0–46.6)
Hemoglobin: 10.3 g/dL — ABNORMAL LOW (ref 11.1–15.9)
IMMATURE GRANS (ABS): 0 10*3/uL (ref 0.0–0.1)
Immature Granulocytes: 0 %
LYMPHS: 33 %
Lymphocytes Absolute: 2.5 10*3/uL (ref 0.7–3.1)
MCH: 29.9 pg (ref 26.6–33.0)
MCHC: 33.2 g/dL (ref 31.5–35.7)
MCV: 90 fL (ref 79–97)
MONOCYTES: 5 %
Monocytes Absolute: 0.4 10*3/uL (ref 0.1–0.9)
NEUTROS PCT: 60 %
Neutrophils Absolute: 4.5 10*3/uL (ref 1.4–7.0)
Platelets: 293 10*3/uL (ref 150–379)
RBC: 3.45 x10E6/uL — AB (ref 3.77–5.28)
RDW: 14.7 % (ref 12.3–15.4)
WBC: 7.5 10*3/uL (ref 3.4–10.8)

## 2016-07-11 ENCOUNTER — Encounter: Payer: Self-pay | Admitting: *Deleted

## 2016-07-11 NOTE — Progress Notes (Signed)
Cardiac Individual Treatment Plan  Patient Details  Name: Margaret Hendricks MRN: [id number] Date of Birth: [birth year] Referring Provider:   Flowsheet Row Cardiac Rehab from 03/15/2016 in [facility] Cardiac and Pulmonary Rehab  Referring Provider  Neoma Laming, MD      Initial Encounter Date:  Flowsheet Row Cardiac Rehab from 03/15/2016 in [facility] Cardiac and Pulmonary Rehab  Date  03/15/16  Referring Provider  Neoma Laming, MD      Visit Diagnosis: No diagnosis found.  Patient's Home Medications on Admission:  Current Outpatient Prescriptions:  .  albuterol (PROVENTIL HFA;VENTOLIN HFA) 108 (90 BASE) MCG/ACT inhaler, Inhale 2 puffs into the lungs every 6 (six) hours as needed for wheezing., Disp: , Rfl:  .  carvedilol (COREG) 25 MG tablet, Take 25 mg by mouth 2 (two) times daily with a meal., Disp: , Rfl:  .  digoxin (LANOXIN) 0.125 MG tablet, Take 0.125 mg by mouth daily., Disp: , Rfl:  .  furosemide (LASIX) 20 MG tablet, Take 20 mg by mouth daily., Disp: , Rfl:  .  ibuprofen (ADVIL,MOTRIN) 200 MG tablet, Take 600 mg by mouth every 6 (six) hours as needed for mild pain., Disp: , Rfl:  .  Multiple Vitamin (MULTIVITAMIN WITH MINERALS) TABS tablet, Take 1 tablet by mouth daily., Disp: , Rfl:  .  sacubitril-valsartan (ENTRESTO) 97-103 MG, Take 1 tablet by mouth 2 (two) times daily., Disp: , Rfl:  .  spironolactone (ALDACTONE) 25 MG tablet, Take 1 tablet (25 mg total) by mouth daily., Disp: 30 tablet, Rfl: 0  Past Medical History: Past Medical History:  Diagnosis Date  . Chronic systolic CHF (congestive heart failure), NYHA class 2- 3 ([place]) 07/03/2016  . COPD (chronic obstructive pulmonary disease) ([place])   . LBBB (left bundle branch block) 07/03/2016  . NICM (nonischemic cardiomyopathy) ([place]) 07/03/2016    Tobacco Use: History  Smoking Status  . Current Every Day Smoker  . Years: 30.00  . Last attempt to quit: 01/28/2015  Smokeless Tobacco  . Never Used    Comment: Margaret Hendricks admits she  smokes 1/2 cigarette in am on the way to work and 1/2 cigarette on the way home from work.     Labs: Recent Review Flowsheet Data    Labs for ITP Cardiac and Pulmonary Rehab Latest Ref Rng & Units 03/14/2015   Cholestrol 0 - 200 mg/dL 126   LDLCALC 0 - 99 mg/dL 56   HDL >40 mg/dL 47   Trlycerides <150 mg/dL 114       Exercise Target Goals:    Exercise Program Goal: Individual exercise prescription set with THRR, safety & activity barriers. Participant demonstrates ability to understand and report RPE using BORG scale, to self-measure pulse accurately, and to acknowledge the importance of the exercise prescription.  Exercise Prescription Goal: Starting with aerobic activity 30 plus minutes a day, 3 days per week for initial exercise prescription. Provide home exercise prescription and guidelines that participant acknowledges understanding prior to discharge.  Activity Barriers & Risk Stratification:   6 Minute Walk:   Initial Exercise Prescription:   Perform Capillary Blood Glucose checks as needed.  Exercise Prescription Changes:   Exercise Comments:     Exercise Comments    Row Name 06/07/16 1529           Exercise Comments Vicktoria has not attended since 03/19/16.          Discharge Exercise Prescription (Final Exercise Prescription Changes):   Nutrition:  Target Goals: Understanding of nutrition guidelines, daily intake of  sodium 1500mg , cholesterol 200mg , calories 30% from fat and 7% or less from saturated fats, daily to have 5 or more servings of fruits and vegetables.  Biometrics:    Nutrition Therapy Plan and Nutrition Goals:   Nutrition Discharge: Rate Your Plate Scores:     Nutrition Assessments - 06/15/16 1443      Rate Your Plate Scores   Pre Score 64   Pre Score % 71 %      Nutrition Goals Re-Evaluation:   Psychosocial: Target Goals: Acknowledge presence or absence of depression, maximize coping skills, provide positive  support system. Participant is able to verbalize types and ability to use techniques and skills needed for reducing stress and depression.  Initial Review & Psychosocial Screening:   Quality of Life Scores:   PHQ-9: Recent Review Flowsheet Data    Depression screen [facility] 2/9 03/15/2016   Decreased Interest 0   Down, Depressed, Hopeless 0   PHQ - 2 Score 0   Altered sleeping 3   Tired, decreased energy 1   Change in appetite 0   Feeling bad or failure about yourself  0   Trouble concentrating 0   Moving slowly or fidgety/restless 0   Suicidal thoughts 0   PHQ-9 Score 4   Difficult doing work/chores Somewhat difficult      Psychosocial Evaluation and Intervention:   Psychosocial Re-Evaluation:   Vocational Rehabilitation: Provide vocational rehab assistance to qualifying candidates.   Vocational Rehab Evaluation & Intervention:   Education: Education Goals: Education classes will be provided on a weekly basis, covering required topics. Participant will state understanding/return demonstration of topics presented.  Learning Barriers/Preferences:   Education Topics: General Nutrition Guidelines/Fats and Fiber: -Group instruction provided by verbal, written material, models and posters to present the general guidelines for heart healthy nutrition. Gives an explanation and review of dietary fats and fiber.   Controlling Sodium/Reading Food Labels: -Group verbal and written material supporting the discussion of sodium use in heart healthy nutrition. Review and explanation with models, verbal and written materials for utilization of the food label.   Exercise Physiology & Risk Factors: - Group verbal and written instruction with models to review the exercise physiology of the cardiovascular system and associated critical values. Details cardiovascular disease risk factors and the goals associated with each risk factor.   Aerobic Exercise & Resistance Training: - Gives  group verbal and written discussion on the health impact of inactivity. On the components of aerobic and resistive training programs and the benefits of this training and how to safely progress through these programs.   Flexibility, Balance, General Exercise Guidelines: - Provides group verbal and written instruction on the benefits of flexibility and balance training programs. Provides general exercise guidelines with specific guidelines to those with heart or lung disease. Demonstration and skill practice provided.   Stress Management: - Provides group verbal and written instruction about the health risks of elevated stress, cause of high stress, and healthy ways to reduce stress.   Depression: - Provides group verbal and written instruction on the correlation between heart/lung disease and depressed mood, treatment options, and the stigmas associated with seeking treatment.   Anatomy & Physiology of the Heart: - Group verbal and written instruction and models provide basic cardiac anatomy and physiology, with the coronary electrical and arterial systems. Review of: AMI, Angina, Valve disease, Heart Failure, Cardiac Arrhythmia, Pacemakers, and the ICD.   Cardiac Procedures: - Group verbal and written instruction and models to describe the testing methods done to  diagnose heart disease. Reviews the outcomes of the test results. Describes the treatment choices: Medical Management, Angioplasty, or Coronary Bypass Surgery.   Cardiac Medications: - Group verbal and written instruction to review commonly prescribed medications for heart disease. Reviews the medication, class of the drug, and side effects. Includes the steps to properly store meds and maintain the prescription regimen.   Go Sex-Intimacy & Heart Disease, Get SMART - Goal Setting: - Group verbal and written instruction through game format to discuss heart disease and the return to sexual intimacy. Provides group verbal and  written material to discuss and apply goal setting through the application of the S.M.A.R.T. Method.   Other Matters of the Heart: - Provides group verbal, written materials and models to describe Heart Failure, Angina, Valve Disease, and Diabetes in the realm of heart disease. Includes description of the disease process and treatment options available to the cardiac patient.   Exercise & Equipment Safety: - Individual verbal instruction and demonstration of equipment use and safety with use of the equipment. Flowsheet Row Cardiac Rehab from 03/19/2016 in [facility] Cardiac and Pulmonary Rehab  Date  03/15/16  Educator  C. EnterkinRN  Instruction Review Code  2- meets goals/outcomes      Infection Prevention: - Provides verbal and written material to individual with discussion of infection control including proper hand washing and proper equipment cleaning during exercise session. Flowsheet Row Cardiac Rehab from 03/19/2016 in [facility] Cardiac and Pulmonary Rehab  Date  03/15/16  Educator  C. EnterkinRN  Instruction Review Code  2- meets goals/outcomes      Falls Prevention: - Provides verbal and written material to individual with discussion of falls prevention and safety. Flowsheet Row Cardiac Rehab from 03/19/2016 in [facility] Cardiac and Pulmonary Rehab  Date  03/15/16  Educator  C. Remington  Instruction Review Code  2- meets goals/outcomes      Diabetes: - Individual verbal and written instruction to review signs/symptoms of diabetes, desired ranges of glucose level fasting, after meals and with exercise. Advice that pre and post exercise glucose checks will be done for 3 sessions at entry of program.    Knowledge Questionnaire Score:   Core Components/Risk Factors/Patient Goals at Admission:   Core Components/Risk Factors/Patient Goals Review:    Core Components/Risk Factors/Patient Goals at Discharge (Final Review):    ITP Comments:     ITP Comments    Row Name 05/16/16  0801 06/13/16 0829 07/11/16 1002       ITP Comments 30 day review. Continue with ITP remains out 30 day review. Continue with ITP unless changes noted by Medical Director at signature of review.  Remains out of program 3 visits since med reivew on 5/18 Discharged        Comments:

## 2016-07-16 ENCOUNTER — Encounter (HOSPITAL_COMMUNITY): Payer: Self-pay | Admitting: *Deleted

## 2016-07-16 ENCOUNTER — Encounter (HOSPITAL_COMMUNITY)
Admission: RE | Disposition: A | Discharge status: Home / Self Care | Payer: Self-pay | Source: Ambulatory Visit | Attending: Internal Medicine

## 2016-07-16 ENCOUNTER — Ambulatory Visit (HOSPITAL_COMMUNITY)
Admission: RE | Admit: 2016-07-16 | Discharge: 2016-07-17 | Disposition: A | Discharge status: Home / Self Care | Payer: BLUE CROSS/BLUE SHIELD | Source: Ambulatory Visit | Attending: Internal Medicine | Admitting: Internal Medicine

## 2016-07-16 DIAGNOSIS — I5022 Chronic systolic (congestive) heart failure: Secondary | ICD-10-CM | POA: Diagnosis not present

## 2016-07-16 DIAGNOSIS — I429 Cardiomyopathy, unspecified: Secondary | ICD-10-CM | POA: Diagnosis not present

## 2016-07-16 DIAGNOSIS — Z959 Presence of cardiac and vascular implant and graft, unspecified: Secondary | ICD-10-CM

## 2016-07-16 DIAGNOSIS — I447 Left bundle-branch block, unspecified: Secondary | ICD-10-CM | POA: Diagnosis not present

## 2016-07-16 DIAGNOSIS — Z8249 Family history of ischemic heart disease and other diseases of the circulatory system: Secondary | ICD-10-CM | POA: Insufficient documentation

## 2016-07-16 DIAGNOSIS — D649 Anemia, unspecified: Secondary | ICD-10-CM | POA: Insufficient documentation

## 2016-07-16 DIAGNOSIS — F17210 Nicotine dependence, cigarettes, uncomplicated: Secondary | ICD-10-CM | POA: Insufficient documentation

## 2016-07-16 DIAGNOSIS — J449 Chronic obstructive pulmonary disease, unspecified: Secondary | ICD-10-CM | POA: Diagnosis not present

## 2016-07-16 DIAGNOSIS — I428 Other cardiomyopathies: Secondary | ICD-10-CM | POA: Diagnosis not present

## 2016-07-16 HISTORY — PX: EP IMPLANTABLE DEVICE: SHX172B

## 2016-07-16 LAB — SURGICAL PCR SCREEN
MRSA, PCR: NEGATIVE
Staphylococcus aureus: NEGATIVE

## 2016-07-16 SURGERY — BIV ICD INSERTION CRT-D
Anesthesia: LOCAL

## 2016-07-16 MED ORDER — SODIUM CHLORIDE 0.9 % IV SOLN: INTRAVENOUS | Status: AC

## 2016-07-16 MED ORDER — ADULT MULTIVITAMIN W/MINERALS CH
1.0000 | ORAL_TABLET | Freq: Every day | ORAL | Status: DC
  Administered 2016-07-16 – 2016-07-17 (×2): 1 via ORAL
  Filled 2016-07-16 (×2): qty 1

## 2016-07-16 MED ORDER — LIDOCAINE HCL (PF) 1 % IJ SOLN
INTRAMUSCULAR | Status: AC
  Filled 2016-07-16: qty 30

## 2016-07-16 MED ORDER — FUROSEMIDE 20 MG PO TABS
20.0000 mg | ORAL_TABLET | Freq: Every day | ORAL | Status: DC
  Administered 2016-07-16 – 2016-07-17 (×2): 20 mg via ORAL
  Filled 2016-07-16 (×2): qty 1

## 2016-07-16 MED ORDER — IBUPROFEN 600 MG PO TABS: 600.0000 mg | ORAL_TABLET | Freq: Four times a day (QID) | ORAL | Status: DC | PRN

## 2016-07-16 MED ORDER — HEPARIN (PORCINE) IN NACL 2-0.9 UNIT/ML-% IJ SOLN
INTRAMUSCULAR | Status: DC | PRN
  Administered 2016-07-16: 08:00:00

## 2016-07-16 MED ORDER — HEPARIN (PORCINE) IN NACL 2-0.9 UNIT/ML-% IJ SOLN
INTRAMUSCULAR | Status: AC
  Filled 2016-07-16: qty 500

## 2016-07-16 MED ORDER — CEFAZOLIN SODIUM-DEXTROSE 2-4 GM/100ML-% IV SOLN
2.0000 g | INTRAVENOUS | Status: AC
  Administered 2016-07-16: 2 g via INTRAVENOUS

## 2016-07-16 MED ORDER — LIDOCAINE HCL (PF) 1 % IJ SOLN
INTRAMUSCULAR | Status: DC | PRN
  Administered 2016-07-16: 57 mL

## 2016-07-16 MED ORDER — SODIUM CHLORIDE 0.9 % IR SOLN
Status: AC
  Filled 2016-07-16: qty 2

## 2016-07-16 MED ORDER — SODIUM CHLORIDE 0.9 % IV SOLN
INTRAVENOUS | Status: DC
  Administered 2016-07-16: 06:00:00 via INTRAVENOUS

## 2016-07-16 MED ORDER — MUPIROCIN 2 % EX OINT
1.0000 "application " | TOPICAL_OINTMENT | Freq: Once | CUTANEOUS | Status: AC
  Administered 2016-07-16: 1 via TOPICAL

## 2016-07-16 MED ORDER — SPIRONOLACTONE 25 MG PO TABS
25.0000 mg | ORAL_TABLET | Freq: Every day | ORAL | Status: DC
  Administered 2016-07-16 – 2016-07-17 (×2): 25 mg via ORAL
  Filled 2016-07-16 (×2): qty 1

## 2016-07-16 MED ORDER — SODIUM CHLORIDE 0.9 % IR SOLN
80.0000 mg | Status: AC
  Administered 2016-07-16: 80 mg

## 2016-07-16 MED ORDER — CEFAZOLIN IN D5W 1 GM/50ML IV SOLN
1.0000 g | Freq: Four times a day (QID) | INTRAVENOUS | Status: AC
  Administered 2016-07-16 – 2016-07-17 (×3): 1 g via INTRAVENOUS
  Filled 2016-07-16 (×3): qty 50

## 2016-07-16 MED ORDER — CHLORHEXIDINE GLUCONATE 4 % EX LIQD: 60.0000 mL | Freq: Once | CUTANEOUS | Status: DC

## 2016-07-16 MED ORDER — FENTANYL CITRATE (PF) 100 MCG/2ML IJ SOLN
INTRAMUSCULAR | Status: AC
  Filled 2016-07-16: qty 2

## 2016-07-16 MED ORDER — MUPIROCIN 2 % EX OINT
TOPICAL_OINTMENT | CUTANEOUS | Status: AC
  Administered 2016-07-16: 1 via TOPICAL
  Filled 2016-07-16: qty 22

## 2016-07-16 MED ORDER — CARVEDILOL 25 MG PO TABS
25.0000 mg | ORAL_TABLET | Freq: Two times a day (BID) | ORAL | Status: DC
  Administered 2016-07-16 – 2016-07-17 (×2): 25 mg via ORAL
  Filled 2016-07-16 (×2): qty 1

## 2016-07-16 MED ORDER — SACUBITRIL-VALSARTAN 97-103 MG PO TABS
1.0000 | ORAL_TABLET | Freq: Two times a day (BID) | ORAL | Status: DC
  Administered 2016-07-16 – 2016-07-17 (×3): 1 via ORAL
  Filled 2016-07-16 (×3): qty 1

## 2016-07-16 MED ORDER — ACETAMINOPHEN 325 MG PO TABS
325.0000 mg | ORAL_TABLET | ORAL | Status: DC | PRN
  Administered 2016-07-16 (×2): 650 mg via ORAL
  Filled 2016-07-16 (×2): qty 2

## 2016-07-16 MED ORDER — MIDAZOLAM HCL 5 MG/5ML IJ SOLN
INTRAMUSCULAR | Status: DC | PRN
  Administered 2016-07-16: 2 mg via INTRAVENOUS
  Administered 2016-07-16 (×2): 1 mg via INTRAVENOUS
  Administered 2016-07-16: 2 mg via INTRAVENOUS

## 2016-07-16 MED ORDER — MIDAZOLAM HCL 5 MG/5ML IJ SOLN
INTRAMUSCULAR | Status: AC
  Filled 2016-07-16: qty 5

## 2016-07-16 MED ORDER — CEFAZOLIN SODIUM-DEXTROSE 2-4 GM/100ML-% IV SOLN
INTRAVENOUS | Status: AC
  Filled 2016-07-16: qty 100

## 2016-07-16 MED ORDER — FENTANYL CITRATE (PF) 100 MCG/2ML IJ SOLN
INTRAMUSCULAR | Status: AC
Start: 2016-07-16 — End: 2016-07-16
  Filled 2016-07-16: qty 2

## 2016-07-16 MED ORDER — DIGOXIN 125 MCG PO TABS
0.1250 mg | ORAL_TABLET | Freq: Every day | ORAL | Status: DC
  Administered 2016-07-16 – 2016-07-17 (×2): 0.125 mg via ORAL
  Filled 2016-07-16 (×2): qty 1

## 2016-07-16 MED ORDER — ALBUTEROL SULFATE (2.5 MG/3ML) 0.083% IN NEBU: 3.0000 mL | INHALATION_SOLUTION | Freq: Four times a day (QID) | RESPIRATORY_TRACT | Status: DC | PRN

## 2016-07-16 MED ORDER — FENTANYL CITRATE (PF) 100 MCG/2ML IJ SOLN
INTRAMUSCULAR | Status: DC | PRN
  Administered 2016-07-16: 25 ug via INTRAVENOUS
  Administered 2016-07-16: 50 ug via INTRAVENOUS
  Administered 2016-07-16 (×2): 25 ug via INTRAVENOUS

## 2016-07-16 MED ORDER — ONDANSETRON HCL 4 MG/2ML IJ SOLN: 4.0000 mg | Freq: Four times a day (QID) | INTRAMUSCULAR | Status: DC | PRN

## 2016-07-16 SURGICAL SUPPLY — 17 items
CABLE SURGICAL S-101-97-12 (CABLE) ×2 IMPLANT
CATH ATTAIN SELECT 6238TEL (CATHETERS) ×2 IMPLANT
CATH CPS DIRECT 135 DS2C020 (CATHETERS) ×2 IMPLANT
COVER DOME SNAP 22 D (MISCELLANEOUS) ×2 IMPLANT
ICD CLARIA MRI DTMA1QQ (ICD Generator) ×2 IMPLANT
LEAD CAPSURE NOVUS 45CM (Lead) ×2 IMPLANT
LEAD QUARTET 1458Q-86CM (Lead) ×2 IMPLANT
LEAD SPRINT QUAT SEC 6935M-62 (Lead) ×2 IMPLANT
PAD DEFIB LIFELINK (PAD) ×2 IMPLANT
SHEATH CLASSIC 7F (SHEATH) ×2 IMPLANT
SHEATH CLASSIC 9.5F (SHEATH) ×2 IMPLANT
SHEATH CLASSIC 9F (SHEATH) ×2 IMPLANT
SHIELD RADPAD SCOOP 12X17 (MISCELLANEOUS) ×2 IMPLANT
SLITTER UNIVERSAL DS2A003 (MISCELLANEOUS) ×2 IMPLANT
TRAY PACEMAKER INSERTION (PACKS) ×2 IMPLANT
WIRE ACUITY WHISPER EDS 4648 (WIRE) ×2 IMPLANT
WIRE HI TORQ VERSACORE-J 145CM (WIRE) ×4 IMPLANT

## 2016-07-16 NOTE — H&P (View-Only) (Signed)
ELECTROPHYSIOLOGY CONSULT NOTE  Patient ID: Margaret Hendricks, MRN: [id number], DOB/AGE: Jan 11, 1956 60 y.o. Admit date: (Not on file) Date of Consult: 07/03/2016  Primary Physician: Willa Frater, MD Primary Cardiologist: [place] [facility] same  Chief Complaint: ICD   HPI Margaret Hendricks is a 60 y.o. female  Referred for consideration of an ICD.  She was found in 3/16 to have a nonischemic cardiomyopathy when she presented with symptoms of subacute congestive heart failure manifested by orthopnea nocturnal dyspnea and dyspnea on exertion. She has not had pedal edema. Ejection fraction was 10%. She was treated with guideline directed medical therapy with interval up titration. She was also treated with a LifeVest. She has been wearing it ever since.  Interval echo 4/17 EF 30-35% 05/1724-30%.  She's had no palpitations. She's had no syncope. She has had one false alarm from her LifeVest that occur. Her son's wedding.  There is no family history for cardiomyopathy. Her parents are still both alive without heart disease.  She is currently able to walk a mile and 30-40 minutes. She can climb a flight of stairs with some difficulty. She does not have nocturnal dyspnea orthopnea at this point or peripheral edema      Past Medical History:  Diagnosis Date  . Chronic systolic CHF (congestive heart failure), NYHA class 2- 3 ([place]) 07/03/2016  . COPD (chronic obstructive pulmonary disease) ([place])   . LBBB (left bundle branch block) 07/03/2016  . NICM (nonischemic cardiomyopathy) ([place]) 07/03/2016      Surgical History:  Past Surgical History:  Procedure Laterality Date  . APPENDECTOMY    . CARDIAC CATHETERIZATION Left 03/14/2015   Procedure: Left Heart Cath;  Surgeon: Dionisio David, MD;  Location: [facility];  Service: Cardiovascular;  Laterality: Left;  . TUBAL LIGATION Bilateral      Home Meds: Prior to Admission medications   Medication Sig Start Date End  Date Taking? Authorizing Provider  albuterol (PROVENTIL HFA;VENTOLIN HFA) 108 (90 BASE) MCG/ACT inhaler Inhale 2 puffs into the lungs every 6 (six) hours as needed for wheezing.    Historical Provider, MD  buPROPion (ZYBAN) 150 MG 12 hr tablet Take 150 mg by mouth daily.    Historical Provider, MD  carvedilol (COREG) 6.25 MG tablet Take 1 tablet (6.25 mg total) by mouth 2 (two) times daily with a meal. Patient taking differently: Take 25 mg by mouth 2 (two) times daily.  03/16/15   Epifanio Lesches, MD  digoxin (LANOXIN) 0.125 MG tablet Take 0.125 mg by mouth daily.    Historical Provider, MD  fluticasone (FLOVENT HFA) 220 MCG/ACT inhaler Inhale 1 puff into the lungs 2 (two) times daily.    Historical Provider, MD  furosemide (LASIX) 20 MG tablet Take 20 mg by mouth daily.    Historical Provider, MD  ibuprofen (ADVIL,MOTRIN) 200 MG tablet Take 600 mg by mouth every 6 (six) hours as needed for mild pain.    Historical Provider, MD  Ipratropium-Albuterol (COMBIVENT) 20-100 MCG/ACT AERS respimat Inhale 1 puff into the lungs 4 (four) times daily. 03/16/15   Epifanio Lesches, MD  Multiple Vitamin (MULTIVITAMIN WITH MINERALS) TABS tablet Take 1 tablet by mouth daily.    Historical Provider, MD  sacubitril-valsartan (ENTRESTO) 24-26 MG Take 1 tablet by mouth 2 (two) times daily. Patient taking differently: Take 97-103 tablets by mouth 2 (two) times daily.  03/16/15   Epifanio Lesches, MD  spironolactone (ALDACTONE) 25 MG tablet Take 1 tablet (25 mg total) by  mouth daily. 03/16/15   Epifanio Lesches, MD    Allergies: No Known Allergies  Social History   Social History  . Marital status: Married    Spouse name: N/A  . Number of children: N/A  . Years of education: N/A   Occupational History  . Not on file.   Social History Main Topics  . Smoking status: Current Every Day Smoker    Years: 30.00    Last attempt to quit: 01/28/2015  . Smokeless tobacco: Never Used     Comment: Saquana  admits she smokes 1/2 cigarette in am on the way to work and 1/2 cigarette on the way home from work.   . Alcohol use No  . Drug use: No  . Sexual activity: Not on file   Other Topics Concern  . Not on file   Social History Narrative  . No narrative on file     Family History  Problem Relation Age of Onset  . Hypertension    . Heart attack       ROS:  Please see the history of present illness.     All other systems reviewed and negative.    Physical Exam: Blood pressure (!) 112/58, pulse 72, height 5\' 3"  (1.6 m), weight 180 lb 4 oz (81.8 kg). General: Well developed, well nourished female in no acute distress. Head: Normocephalic, atraumatic, sclera non-icteric, no xanthomas, nares are without discharge. EENT: normal  Lymph Nodes:  none Neck: Negative for carotid bruits. JVD 6-8 Back:without scoliosis kyphosis Lungs: Clear bilaterally to auscultation without wheezes, rales, or rhonchi. Breathing is unlabored. Heart: RRR with S1 S2.  2/6 systolic  murmur . No rubs, or gallops appreciated. Abdomen: Soft, non-tender, non-distended with normoactive bowel sounds. No hepatomegaly. No rebound/guarding. No obvious abdominal masses. Msk:  Strength and tone appear normal for age. Extremities: No clubbing or cyanosis. No edema.  Distal pedal pulses are 2+ and equal bilaterally. Skin: Warm and Dry Neuro: Alert and oriented X 3. CN III-XII intact Grossly normal sensory and motor function . Psych:  Responds to questions appropriately with a normal affect.      Labs: Cardiac Enzymes No results for input(s): CKTOTAL, CKMB, TROPONINI in the last 72 hours. CBC Lab Results  Component Value Date   WBC 4.6 03/12/2015   HGB 10.6 (L) 03/12/2015   HCT 31.7 (L) 03/12/2015   MCV 89.7 03/12/2015   PLT 228 03/12/2015   PROTIME: No results for input(s): LABPROT, INR in the last 72 hours. Chemistry No results for input(s): NA, K, CL, CO2, BUN, CREATININE, CALCIUM, PROT, BILITOT, ALKPHOS, ALT,  AST, GLUCOSE in the last 168 hours.  Invalid input(s): LABALBU Lipids Lab Results  Component Value Date   CHOL 126 03/14/2015   HDL 47 03/14/2015   LDLCALC 56 03/14/2015   TRIG 114 03/14/2015   BNP No results found for: PROBNP Thyroid Function Tests: No results for input(s): TSH, T4TOTAL, T3FREE, THYROIDAB in the last 72 hours.  Invalid input(s): FREET3 Miscellaneous No results found for: DDIMER  Radiology/Studies:  No results found.  EKG:  Sinus rhythm at 72 Intervals 17/16/41 Left bundle branch block with multiple QRS deflections  Assessment and Plan:  Nonischemic cardiomyopathy  Left bundle branch block  Congestive heart failure-chronic-systolic class IIb  Anemia   The patient has persistent left ventricular dysfunction despite guidelines directed medical therapy. With her symptoms of heart failure and left bundle branch block she is an appropriate candidate for CRT. The fragmented QRS has been recently associated  with improved likelihood of benefit.  Given her young age, the context of [place], it is reasonable to proceed with CRT-D implantation  Euvolemic continue current meds  Need to check Dig level, repeat HgB    Have reviewed the potential benefits and risks of ICD implantation including but not limited to death, perforation of heart or lung, lead dislodgement, infection,  device malfunction and inappropriate shocks.  The patient and family express understanding  and are willing to proceed.       Virl Axe

## 2016-07-16 NOTE — Discharge Summary (Signed)
ELECTROPHYSIOLOGY PROCEDURE DISCHARGE SUMMARY    Patient ID: Margaret Hendricks,  MRN: [id number], DOB/AGE: [birth year] 60 y.o.  Admit date: 07/16/2016 Discharge date: 07/17/2016  Primary Care Physician: Willa Frater, MD Primary Cardiologist: Humphrey Rolls Electrophysiologist: Caryl Comes  Primary Discharge Diagnosis:  Non ischemic cardiomyopathy, congestive heart failure, LBBB s/p CRTD implant this admission  Secondary Discharge Diagnosis:  1.  COPD 2.  Prior tobacco abuse  No Known Allergies   Procedures This Admission:  1.  Implantation of a MDT CRTD on 07/16/16 by Dr Caryl Comes.  See op note for full details.  There were no immediate post procedure complications. 2.  CXR on 07/17/16 demonstrated no pneumothorax status post device implantation.   Brief HPI: Margaret Hendricks is a 60 y.o. female was referred to electrophysiology in the outpatient setting for consideration of ICD implantation.  Past medical history includes non ischemic cardiomyopathy, congestive heart failure, LBBB.  The patient has persistent LV dysfunction despite guideline directed therapy.  Risks, benefits, and alternatives to ICD implantation were reviewed with the patient who wished to proceed.   Hospital Course:  The patient was admitted and underwent implantation of a MDT CRTD with details as outlined above. She was monitored on telemetry overnight which demonstrated P-synchronous pacing  Left chest was without hematoma or ecchymosis.  The device was interrogated and found to be functioning normally.  CXR was obtained and demonstrated no pneumothorax status post device implantation.  Wound care, arm mobility, and restrictions were reviewed with the patient.  The patient was examined and considered stable for discharge to home.   The patient's discharge medications include an ARB (Entresto) and beta blocker (Coreg).   Labs were remarkable this admission for increased Hgb and Hct.  Pt is asked to follow up with PCP in 4  weeks.  CXR also demonstrated questionable pulmonary nodule with repeat PA/Lat CXR recommended. Pt was already aware of lung nodule and underwent CT scan last week with [facility]. Asked her to follow up with PCP for results.   Physical Exam: Vitals:   07/16/16 1300 07/16/16 1522 07/16/16 2100 07/17/16 0500  BP: 123/67  106/69 120/67  Pulse:  66 67 63  Resp:   18 17  Temp: 98.3 F (36.8 C)  97.8 F (36.6 C) 97.9 F (36.6 C)  TempSrc: Oral     SpO2:   98% 97%  Weight:    178 lb 3.2 oz (80.8 kg)  Height:        GEN- The patient is well appearing, alert and oriented x 3 today.   HEENT: normocephalic, atraumatic; sclera clear, conjunctiva pink; hearing intact; oropharynx clear; neck supple  Lungs- Clear to ausculation bilaterally, normal work of breathing.  No wheezes, rales, rhonchi Heart- Regular rate and rhythm (paced) Wound without heamtoma GI- soft, non-tender, non-distended, bowel sounds present  Extremities- no clubbing, cyanosis, or edema; DP/PT/radial pulses 2+ bilaterally MS- no significant deformity or atrophy Skin- warm and dry, no rash or lesion, left chest without hematoma/ecchymosis Psych- euthymic mood, full affect Neuro- strength and sensation are intact   Labs:   Lab Results  Component Value Date   WBC 7.5 07/03/2016   HGB 10.6 (L) 03/12/2015   HCT 31.0 (L) 07/03/2016   MCV 90 07/03/2016   PLT 293 07/03/2016   No results for input(s): NA, K, CL, CO2, BUN, CREATININE, CALCIUM, PROT, BILITOT, ALKPHOS, ALT, AST, GLUCOSE in the last 168 hours.  Invalid input(s): LABALBU  Discharge Medications:    Medication List  TAKE these medications   albuterol 108 (90 Base) MCG/ACT inhaler Commonly known as:  PROVENTIL HFA;VENTOLIN HFA Inhale 2 puffs into the lungs every 6 (six) hours as needed for wheezing.   BENGAY EX Apply 1 application topically daily as needed. Back pain   carvedilol 25 MG tablet Commonly known as:  COREG Take 25 mg by mouth 2  (two) times daily with a meal.   digoxin 0.125 MG tablet Commonly known as:  LANOXIN Take 0.125 mg by mouth daily.   ENTRESTO 97-103 MG Generic drug:  sacubitril-valsartan Take 1 tablet by mouth 2 (two) times daily.   furosemide 20 MG tablet Commonly known as:  LASIX Take 20 mg by mouth daily.   ibuprofen 200 MG tablet Commonly known as:  ADVIL,MOTRIN Take 600 mg by mouth every 6 (six) hours as needed for mild pain.   multivitamin with minerals Tabs tablet Take 1 tablet by mouth daily.   spironolactone 25 MG tablet Commonly known as:  ALDACTONE Take 1 tablet (25 mg total) by mouth daily.       Disposition:  Discharge Instructions    Diet - low sodium heart healthy    Complete by:  As directed    Increase activity slowly    Complete by:  As directed      Follow-up Information    [place] Follow up on 07/24/2016.   Specialty:  Cardiology Why:  at [facility] for wound check  Contact information: [address], [place], MD Follow up on 10/16/2016.   Specialty:  Cardiology Why:  at [facility] information: [place] [id number] [phone]        Neoma Laming A, MD Follow up in 4 week(s).   Specialty:  Cardiology Contact information: [place] [address] [phone]           Duration of Discharge Encounter: Greater than 30 minutes including physician time.  Signed, Chanetta Marshall, NP 07/17/2016 8:52 AM  wlll ask Dr Chancy Milroy to see in 4 weeks

## 2016-07-16 NOTE — Interval H&P Note (Signed)
ICD Criteria  Current LVEF:30%. Within 12 months prior to implant: Yes   Heart failure history: Yes, Class III  Cardiomyopathy history: Yes, Non-Ischemic Cardiomyopathy.  Atrial Fibrillation/Atrial Flutter: No.  Ventricular tachycardia history: No.  Cardiac arrest history: No.  History of syndromes with risk of sudden death: No.  Previous ICD: No.  Current ICD indication: Primary  PPM indication: No.   Class I or II Bradycardia indication present: No  Beta Blocker therapy for 3 or more months: Yes, prescribed.   Ace Inhibitor/ARB therapy for 3 or more months: Yes, prescribed.   History and Physical Interval Note:  07/16/2016 7:17 AM  Margaret Hendricks  has presented today for surgery, with the diagnosis of cardiomyopathy, HF, LBBB  The various methods of treatment have been discussed with the patient and family. After consideration of risks, benefits and other options for treatment, the patient has consented to  Procedure(s): BiV ICD Insertion CRT-D (N/A) as a surgical intervention .  The patient's history has been reviewed, patient examined, no change in status, stable for surgery.  I have reviewed the patient's chart and labs.  Questions were answered to the patient's satisfaction.     Virl Axe

## 2016-07-17 ENCOUNTER — Ambulatory Visit (HOSPITAL_COMMUNITY): Payer: BLUE CROSS/BLUE SHIELD

## 2016-07-17 DIAGNOSIS — I5022 Chronic systolic (congestive) heart failure: Secondary | ICD-10-CM | POA: Diagnosis not present

## 2016-07-17 DIAGNOSIS — I428 Other cardiomyopathies: Secondary | ICD-10-CM | POA: Diagnosis not present

## 2016-07-17 MED FILL — Cefazolin Sodium-Dextrose IV Solution 2 GM/100ML-4%: INTRAVENOUS | Qty: 100 | Status: AC

## 2016-07-17 MED FILL — Gentamicin Sulfate Inj 40 MG/ML: INTRAMUSCULAR | Qty: 2 | Status: AC

## 2016-07-17 MED FILL — Sodium Chloride Irrigation Soln 0.9%: Qty: 500 | Status: AC

## 2016-07-17 NOTE — Op Note (Signed)
Margaret Hendricks, Margaret Hendricks NO.:  [id number]  MEDICAL RECORD NO.:  [id number]  LOCATION:  [id number]                        FACILITY:  [facility]  PHYSICIAN:  Deboraha Sprang, MD, FACCDATE OF BIRTH:  Jan 19, 1956  DATE OF PROCEDURE:  07/16/2016 DATE OF DISCHARGE:                              OPERATIVE REPORT   PREOPERATIVE DIAGNOSIS:  Nonischemic cardiomyopathy with left bundle- branch block and congestive heart failure, class III.  POSTOPERATIVE DIAGNOSIS:  Nonischemic cardiomyopathy with left bundle- branch block and congestive heart failure, class III.  PROCEDURE:  Dual-chamber defibrillator implantation with left ventricular lead placement.  DESCRIPTION OF PROCEDURE:  Following obtaining informed consent, the patient was brought to the electrophysiology laboratory and placed on the fluoroscopic table in supine position.  After routine prep and drape of the left upper chest, lidocaine was infiltrated in prepectoral subclavicular region.  An incision was made and carried down to layer of the prepectoral fascia using electrocautery and sharp dissection.  A pocket was formed similarly.  Hemostasis was obtained.  Thereafter, attention was turned to gain access to the extrathoracic left subclavian vein, which was accomplished without difficulty without puncture of the artery or aspiration of air.  Three separate venipunctures were accomplished.  Guidewires were placed and retained and sequentially, a 9-French, 9.5-French and 7-French sheaths were placed, which were passed a Medtronic 6935 62-cm active fixation single coil defibrillator lead, serial number, [id number].  A Saint Jude 135 coronary sinus cannulation catheter and a Medtronic 5076 45-cm active fixation atrial lead, serial number [id number].  Under fluoroscopic guidance, the RV lead was manipulated to the apex where the bipolar R-wave was 10.8 with a pace impedance of 543, threshold of 0.5 V at 0.4 milliseconds,  current of threshold was 1.0 mA, and there was no diaphragmatic pacing at 10 V.  The current of injury was brisk and this lead was secured to the prepectoral fascia.  We then obtained the access to the coronary sinus.  It turned out to be quite difficult because of the serpiginous nature of the coronary sinus. We ended up using two wires and a dilator to deploy the sheath distally. A high lateral branch was identified and a Engineer, technical sales lead, serial number [id number] was deployed through the junction between the distal and mid third.  The distal poles were actually halfway between this junction and the apex.  Initial measurements were obtained about 3-4 pair.  The bipolar L-wave was 22 with a pace impedance of 1154, a threshold of 0.7 V at 0.5 milliseconds.  The QLV initially was about 100 milliseconds (see below).  The 9.5-French sheath was removed and the CS deployment sheath was left in place.  We then deployed the right atrial lead to the right atrial appendage where the bipolar P-wave was 2.1 with a pace impedance of 662, threshold of 1.3 V at 0.5 milliseconds.  Current of threshold was 2.1 mA and there was no diaphragmatic pacing at 10 V, the current of injury was moderate. This lead was secured to the prepectoral fascia.  We then noted that the CS sheath had been pulled proximally presumably when I removed the 9.5-French sheath.  The lead position had retracted a little bit, we  deployed it more distally.  On removal of the sheath, there was partial retraction of the lead again, so that the lead was deployed along the mid body of the anterolateral vein.  Because of the [facility] S turn being totally flat and this lead position was probably adequate and the lead was secured in this position.  In a 1-2 configuration, the QLV was 110.  In the 3-4 configuration, it was 90 milliseconds.  The leads were then attached to a [employer] [facility] ICD, serial number [id number].  Through the  device, bipolar P-wave was 4 with a pace impedance of 437, threshold of 0.7 V at 0.4 milliseconds.  The R-wave was 15.5 with a pace impedance of 513, a threshold of 0.5 V at 0.4 milliseconds and the LV lead in a 1-coil configuration, a threshold of 1.5 V at 0.4 and in 1-2 configuration had a threshold of 2.25 V at 0.4. The impedances were 645 and 1045 respectively.  At this juncture, was programmed in 1-coil configuration.  The pocket was copiously irrigated with antibiotic-containing saline solution.  Hemostasis was assured. The leads and the pulse generator were placed in the pocket, secured to the prepectoral fascia and the wound was closed in 2 layers in normal fashion.  The wound was washed, dried and a benzoin, Steri-Strip dressing were applied.  Needle counts, sponge counts and instrument counts were correct at the end of the procedure.  The patient tolerated the procedure without apparent complication.     Deboraha Sprang, MD, [facility]     SCK/MEDQ  D:  07/16/2016  T:  07/17/2016  Job:  [id number]  cc:   Dr. Laurelyn Sickle

## 2016-07-17 NOTE — Discharge Instructions (Signed)
Supplemental Discharge Instructions for  Pacemaker/Defibrillator Patients  Activity No heavy lifting or vigorous activity with your left/right arm for 6 to 8 weeks.  Do not raise your left/right arm above your head for one week.  Gradually raise your affected arm as drawn below.           __          07/20/16                     07/21/16                     07/22/16                 07/23/16  NO DRIVING for   1 week  ; you may begin driving on  [id number]   .  WOUND CARE - Keep the wound area clean and dry.   - No bandage is needed on the site.  DO  NOT apply any creams, oils, or ointments to the wound area. - If you notice any drainage or discharge from the wound, any swelling or bruising at the site, or you develop a fever > 101? F after you are discharged home, call the office at once.  Special Instructions - You are still able to use cellular telephones; use the ear opposite the side where you have your pacemaker/defibrillator.  Avoid carrying your cellular phone near your device. - When traveling through airports, show security personnel your identification card to avoid being screened in the metal detectors.  Ask the security personnel to use the hand wand. - Avoid arc welding equipment, TENS units (transcutaneous nerve stimulators).  Call the office for questions about other devices. - Avoid electrical appliances that are in poor condition or are not properly grounded. - Microwave ovens are safe to be near or to operate.  Additional information for defibrillator patients should your device go off: - If your device goes off ONCE and you feel fine afterward, notify the device clinic nurses. - If your device goes off ONCE and you do not feel well afterward, call 911. - If your device goes off TWICE, call 911. - If your device goes off THREE times in one day, call 911.  DO NOT DRIVE YOURSELF OR A FAMILY MEMBER WITH A DEFIBRILLATOR TO THE HOSPITAL--CALL 911.    Low-Sodium Eating  Plan Sodium raises blood pressure and causes water to be held in the body. Getting less sodium from food will help lower your blood pressure, reduce any swelling, and protect your heart, liver, and kidneys. We get sodium by adding salt (sodium chloride) to food. Most of our sodium comes from canned, boxed, and frozen foods. Restaurant foods, fast foods, and pizza are also very high in sodium. Even if you take medicine to lower your blood pressure or to reduce fluid in your body, getting less sodium from your food is important. WHAT IS MY PLAN? Most people should limit their sodium intake to 2,300 mg a day. Your health care provider recommends that you limit your sodium intake to __________ a day.  WHAT DO I NEED TO KNOW ABOUT THIS EATING PLAN? For the low-sodium eating plan, you will follow these general guidelines:  Choose foods with a % Daily Value for sodium of less than 5% (as listed on the food label).   Use salt-free seasonings or herbs instead of table salt or sea salt.   Check with your health care provider  or pharmacist before using salt substitutes.   Eat fresh foods.  Eat more vegetables and fruits.  Limit canned vegetables. If you do use them, rinse them well to decrease the sodium.   Limit cheese to 1 oz (28 g) per day.   Eat lower-sodium products, often labeled as "lower sodium" or "no salt added."  Avoid foods that contain monosodium glutamate (MSG). MSG is sometimes added to [place] food and some canned foods.  Check food labels (Nutrition Facts labels) on foods to learn how much sodium is in one serving.  Eat more home-cooked food and less restaurant, buffet, and fast food.  When eating at a restaurant, ask that your food be prepared with less salt, or no salt if possible.  HOW DO I READ FOOD LABELS FOR SODIUM INFORMATION? The Nutrition Facts label lists the amount of sodium in one serving of the food. If you eat more than one serving, you must multiply the  listed amount of sodium by the number of servings. Food labels may also identify foods as:  Sodium free--Less than 5 mg in a serving.  Very low sodium--35 mg or less in a serving.  Low sodium--140 mg or less in a serving.  Light in sodium--50% less sodium in a serving. For example, if a food that usually has 300 mg of sodium is changed to become light in sodium, it will have 150 mg of sodium.  Reduced sodium--25% less sodium in a serving. For example, if a food that usually has 400 mg of sodium is changed to reduced sodium, it will have 300 mg of sodium. WHAT FOODS CAN I EAT? Grains Low-sodium cereals, including oats, puffed wheat and rice, and shredded wheat cereals. Low-sodium crackers. Unsalted rice and pasta. Lower-sodium bread.  Vegetables Frozen or fresh vegetables. Low-sodium or reduced-sodium canned vegetables. Low-sodium or reduced-sodium tomato sauce and paste. Low-sodium or reduced-sodium tomato and vegetable juices.  Fruits Fresh, frozen, and canned fruit. Fruit juice.  Meat and Other Protein Products Low-sodium canned tuna and salmon. Fresh or frozen meat, poultry, seafood, and fish. Lamb. Unsalted nuts. Dried beans, peas, and lentils without added salt. Unsalted canned beans. Homemade soups without salt. Eggs.  Dairy Milk. Soy milk. Ricotta cheese. Low-sodium or reduced-sodium cheeses. Yogurt.  Condiments Fresh and dried herbs and spices. Salt-free seasonings. Onion and garlic powders. Low-sodium varieties of mustard and ketchup. Fresh or refrigerated horseradish. Lemon juice.  Fats and Oils Reduced-sodium salad dressings. Unsalted butter.  Other Unsalted popcorn and pretzels.  The items listed above may not be a complete list of recommended foods or beverages. Contact your dietitian for more options. WHAT FOODS ARE NOT RECOMMENDED? Grains Instant hot cereals. Bread stuffing, pancake, and biscuit mixes. Croutons. Seasoned rice or pasta mixes. Noodle soup cups.  Boxed or frozen macaroni and cheese. Self-rising flour. Regular salted crackers. Vegetables Regular canned vegetables. Regular canned tomato sauce and paste. Regular tomato and vegetable juices. Frozen vegetables in sauces. Salted [place] fries. Olives. Angie Fava. Relishes. Sauerkraut. Salsa. Meat and Other Protein Products Salted, canned, smoked, spiced, or pickled meats, seafood, or fish. Bacon, ham, sausage, hot dogs, corned beef, chipped beef, and packaged luncheon meats. Salt pork. Jerky. Pickled herring. Anchovies, regular canned tuna, and sardines. Salted nuts. Dairy Processed cheese and cheese spreads. Cheese curds. Blue cheese and cottage cheese. Buttermilk.  Condiments Onion and garlic salt, seasoned salt, table salt, and sea salt. Canned and packaged gravies. Worcestershire sauce. Tartar sauce. Barbecue sauce. Teriyaki sauce. Soy sauce, including reduced sodium. Steak sauce. Fish sauce.  Oyster sauce. Cocktail sauce. Horseradish that you find on the shelf. Regular ketchup and mustard. Meat flavorings and tenderizers. Bouillon cubes. Hot sauce. Tabasco sauce. Marinades. Taco seasonings. Relishes. Fats and Oils Regular salad dressings. Salted butter. Margarine. Ghee. Bacon fat.  Other Potato and tortilla chips. Corn chips and puffs. Salted popcorn and pretzels. Canned or dried soups. Pizza. Frozen entrees and pot pies.  The items listed above may not be a complete list of foods and beverages to avoid. Contact your dietitian for more information.   This information is not intended to replace advice given to you by your health care provider. Make sure you discuss any questions you have with your health care provider.   Document Released: 04/06/2002 Document Revised: 11/05/2014 Document Reviewed: 08/19/2013 Elsevier Interactive Patient Education [employer].

## 2016-07-19 ENCOUNTER — Encounter (HOSPITAL_COMMUNITY): Payer: Self-pay | Admitting: Cardiology

## 2016-07-24 ENCOUNTER — Ambulatory Visit (INDEPENDENT_AMBULATORY_CARE_PROVIDER_SITE_OTHER): Payer: BLUE CROSS/BLUE SHIELD | Admitting: *Deleted

## 2016-07-24 DIAGNOSIS — Z95810 Presence of automatic (implantable) cardiac defibrillator: Secondary | ICD-10-CM | POA: Diagnosis not present

## 2016-07-24 DIAGNOSIS — I447 Left bundle-branch block, unspecified: Secondary | ICD-10-CM | POA: Diagnosis not present

## 2016-07-24 DIAGNOSIS — I428 Other cardiomyopathies: Secondary | ICD-10-CM

## 2016-07-24 DIAGNOSIS — I429 Cardiomyopathy, unspecified: Secondary | ICD-10-CM | POA: Diagnosis not present

## 2016-07-27 LAB — CUP PACEART INCLINIC DEVICE CHECK
Battery Remaining Longevity: 99 mo
Brady Statistic AS VS Percent: 1.63 %
Date Time Interrogation Session: 20170926191929
HIGH POWER IMPEDANCE MEASURED VALUE: 53 Ohm
Implantable Lead Implant Date: 20170918
Implantable Lead Implant Date: 20170918
Implantable Lead Location: 753858
Implantable Lead Location: 753859
Implantable Lead Model: 5076
Lead Channel Impedance Value: 1007 Ohm
Lead Channel Impedance Value: 1007 Ohm
Lead Channel Impedance Value: 380 Ohm
Lead Channel Impedance Value: 399 Ohm
Lead Channel Impedance Value: 665 Ohm
Lead Channel Impedance Value: 665 Ohm
Lead Channel Pacing Threshold Amplitude: 0.75 V
Lead Channel Pacing Threshold Pulse Width: 0.4 ms
Lead Channel Pacing Threshold Pulse Width: 0.8 ms
Lead Channel Sensing Intrinsic Amplitude: 14 mV
Lead Channel Setting Pacing Amplitude: 3.5 V
MDC IDC LEAD IMPLANT DT: 20170918
MDC IDC LEAD LOCATION: 753860
MDC IDC MSMT BATTERY VOLTAGE: 3.12 V
MDC IDC MSMT LEADCHNL LV IMPEDANCE VALUE: 1007 Ohm
MDC IDC MSMT LEADCHNL LV IMPEDANCE VALUE: 399 Ohm
MDC IDC MSMT LEADCHNL LV IMPEDANCE VALUE: 399 Ohm
MDC IDC MSMT LEADCHNL LV IMPEDANCE VALUE: 399 Ohm
MDC IDC MSMT LEADCHNL LV IMPEDANCE VALUE: 722 Ohm
MDC IDC MSMT LEADCHNL LV IMPEDANCE VALUE: 760 Ohm
MDC IDC MSMT LEADCHNL RA PACING THRESHOLD AMPLITUDE: 0.5 V
MDC IDC MSMT LEADCHNL RA SENSING INTR AMPL: 3.125 mV
MDC IDC MSMT LEADCHNL RV IMPEDANCE VALUE: 323 Ohm
MDC IDC MSMT LEADCHNL RV PACING THRESHOLD AMPLITUDE: 0.75 V
MDC IDC MSMT LEADCHNL RV PACING THRESHOLD PULSEWIDTH: 0.4 ms
MDC IDC SET LEADCHNL LV PACING AMPLITUDE: 3 V
MDC IDC SET LEADCHNL LV PACING PULSEWIDTH: 0.8 ms
MDC IDC SET LEADCHNL RV SENSING SENSITIVITY: 0.3 mV
MDC IDC STAT BRADY AP VP PERCENT: 1.3 %
MDC IDC STAT BRADY AP VS PERCENT: 0.08 %
MDC IDC STAT BRADY AS VP PERCENT: 96.99 %
MDC IDC STAT BRADY RA PERCENT PACED: 1.38 %
MDC IDC STAT BRADY RV PERCENT PACED: 0.16 %

## 2016-07-27 NOTE — Progress Notes (Signed)
Wound check appointment. Dermabond removed. Wound without redness or edema. Incision edges approximated, wound well healed. Normal device function. Thresholds, sensing, and impedances consistent with implant measurements. Device programmed at 3.5V for extra safety margin until 3 month visit. Histogram distribution appropriate for patient and level of activity. BiV pacing 98.1% (effective). No mode switches or ventricular arrhythmias noted. 1 Vs episode, 4 sec duration, likely NSVT (markers only). Patient educated about wound care, arm mobility, lifting restrictions, shock plan. ROV with SK/B on 10/16/16.

## 2016-07-31 ENCOUNTER — Ambulatory Visit: Payer: BLUE CROSS/BLUE SHIELD

## 2016-09-04 ENCOUNTER — Other Ambulatory Visit: Payer: Self-pay | Admitting: Internal Medicine

## 2016-09-04 DIAGNOSIS — N6489 Other specified disorders of breast: Secondary | ICD-10-CM

## 2016-09-06 ENCOUNTER — Other Ambulatory Visit: Payer: Self-pay | Admitting: *Deleted

## 2016-09-06 ENCOUNTER — Inpatient Hospital Stay
Admission: RE | Admit: 2016-09-06 | Discharge: 2016-09-06 | Disposition: A | Discharge status: Home / Self Care | Payer: Self-pay | Source: Ambulatory Visit | Attending: *Deleted | Admitting: *Deleted

## 2016-09-06 DIAGNOSIS — Z9289 Personal history of other medical treatment: Secondary | ICD-10-CM

## 2016-09-19 ENCOUNTER — Ambulatory Visit
Admission: RE | Admit: 2016-09-19 | Discharge: 2016-09-19 | Disposition: A | Discharge status: Home / Self Care | Payer: BLUE CROSS/BLUE SHIELD | Source: Ambulatory Visit | Attending: Internal Medicine | Admitting: Internal Medicine

## 2016-09-19 DIAGNOSIS — N6489 Other specified disorders of breast: Secondary | ICD-10-CM

## 2016-10-16 ENCOUNTER — Ambulatory Visit (INDEPENDENT_AMBULATORY_CARE_PROVIDER_SITE_OTHER): Payer: BLUE CROSS/BLUE SHIELD | Admitting: Internal Medicine

## 2016-10-16 ENCOUNTER — Encounter: Payer: Self-pay | Admitting: Internal Medicine

## 2016-10-16 VITALS — BP 110/60 | HR 58 | Ht 63.0 in | Wt 174.8 lb

## 2016-10-16 DIAGNOSIS — Z95810 Presence of automatic (implantable) cardiac defibrillator: Secondary | ICD-10-CM | POA: Diagnosis not present

## 2016-10-16 DIAGNOSIS — I447 Left bundle-branch block, unspecified: Secondary | ICD-10-CM

## 2016-10-16 DIAGNOSIS — I5022 Chronic systolic (congestive) heart failure: Secondary | ICD-10-CM

## 2016-10-16 DIAGNOSIS — I428 Other cardiomyopathies: Secondary | ICD-10-CM

## 2016-10-16 LAB — CUP PACEART INCLINIC DEVICE CHECK
Battery Remaining Longevity: 112 mo
Battery Voltage: 3.11 V
Brady Statistic RA Percent Paced: 3.54 %
Date Time Interrogation Session: 20171219142949
HighPow Impedance: 65 Ohm
Implantable Lead Implant Date: 20170918
Implantable Lead Implant Date: 20170918
Implantable Lead Location: 753860
Implantable Lead Model: 5076
Implantable Pulse Generator Implant Date: 20170918
Lead Channel Impedance Value: 1102 Ohm
Lead Channel Impedance Value: 1102 Ohm
Lead Channel Impedance Value: 1159 Ohm
Lead Channel Impedance Value: 317.612
Lead Channel Impedance Value: 332.5 Ohm
Lead Channel Impedance Value: 494 Ohm
Lead Channel Impedance Value: 608 Ohm
Lead Channel Impedance Value: 608 Ohm
Lead Channel Impedance Value: 665 Ohm
Lead Channel Pacing Threshold Pulse Width: 0.4 ms
Lead Channel Pacing Threshold Pulse Width: 0.4 ms
Lead Channel Pacing Threshold Pulse Width: 0.8 ms
Lead Channel Setting Pacing Amplitude: 2 V
Lead Channel Setting Pacing Amplitude: 2.5 V
MDC IDC LEAD IMPLANT DT: 20170918
MDC IDC LEAD LOCATION: 753858
MDC IDC LEAD LOCATION: 753859
MDC IDC MSMT LEADCHNL LV IMPEDANCE VALUE: 1159 Ohm
MDC IDC MSMT LEADCHNL LV IMPEDANCE VALUE: 1159 Ohm
MDC IDC MSMT LEADCHNL LV IMPEDANCE VALUE: 1178 Ohm
MDC IDC MSMT LEADCHNL LV IMPEDANCE VALUE: 304 Ohm
MDC IDC MSMT LEADCHNL LV IMPEDANCE VALUE: 317.612
MDC IDC MSMT LEADCHNL LV IMPEDANCE VALUE: 317.612
MDC IDC MSMT LEADCHNL LV IMPEDANCE VALUE: 665 Ohm
MDC IDC MSMT LEADCHNL LV PACING THRESHOLD AMPLITUDE: 1.5 V
MDC IDC MSMT LEADCHNL RA PACING THRESHOLD AMPLITUDE: 0.75 V
MDC IDC MSMT LEADCHNL RA SENSING INTR AMPL: 4.125 mV
MDC IDC MSMT LEADCHNL RV IMPEDANCE VALUE: 399 Ohm
MDC IDC MSMT LEADCHNL RV IMPEDANCE VALUE: 494 Ohm
MDC IDC MSMT LEADCHNL RV PACING THRESHOLD AMPLITUDE: 0.5 V
MDC IDC MSMT LEADCHNL RV SENSING INTR AMPL: 16.25 mV
MDC IDC SET LEADCHNL LV PACING PULSEWIDTH: 0.8 ms
MDC IDC SET LEADCHNL RV PACING AMPLITUDE: 2.5 V
MDC IDC SET LEADCHNL RV PACING PULSEWIDTH: 0.4 ms
MDC IDC SET LEADCHNL RV SENSING SENSITIVITY: 0.3 mV
MDC IDC STAT BRADY AP VP PERCENT: 3.47 %
MDC IDC STAT BRADY AP VS PERCENT: 0.1 %
MDC IDC STAT BRADY AS VP PERCENT: 95.05 %
MDC IDC STAT BRADY AS VS PERCENT: 1.38 %
MDC IDC STAT BRADY RV PERCENT PACED: 0.64 %

## 2016-10-16 NOTE — Patient Instructions (Addendum)
Medication Instructions: - Your physician has recommended you make the following change in your medication:  1) Stop digoxin 2) In 2 weeks (around 10/30/16)- stop spironolactone  Labwork: - none ordered  Procedures/Testing: - none ordered  Follow-Up: - Remote monitoring is used to monitor your Pacemaker of ICD from home. This monitoring reduces the number of office visits required to check your device to one time per year. It allows [place] to keep an eye on the functioning of your device to ensure it is working properly. You are scheduled for a device check from home on 01/15/17. You may send your transmission at any time that day. If you have a wireless device, the transmission will be sent automatically. After your physician reviews your transmission, you will receive a postcard with your next transmission date.  - Your physician wants you to follow-up in: September 2018 with Dr. Caryl Comes. You will receive a reminder letter in the mail two months in advance. If you don't receive a letter, please call our office to schedule the follow-up appointment.  Any Additional Special Instructions Will Be Listed Below (If Applicable).     If you need a refill on your cardiac medications before your next appointment, please call your pharmacy.

## 2016-10-16 NOTE — Progress Notes (Signed)
Patient Care Team: Willa Frater, MD as PCP - General (Internal Medicine)   HPI  Margaret Hendricks is a 60 y.o. female Seen in follow-up for CRT-D implanted 9/17 for nonischemic cardiomyopathy and congestive heart failure She is significantly improved. She is now able to climb stairs. She is able to walk the dog instead of dog walking her. She has no edema.  DATE TEST    8/17    Echo   EF 25 %   12/17    Echo   EF 56 %          Past Medical History:  Diagnosis Date  . Chronic systolic CHF (congestive heart failure), NYHA class 2- 3 ([place]) 07/03/2016  . COPD (chronic obstructive pulmonary disease) ([facility])   . LBBB (left bundle branch block) 07/03/2016  . NICM (nonischemic cardiomyopathy) ([place]) 07/03/2016    Past Surgical History:  Procedure Laterality Date  . APPENDECTOMY    . CARDIAC CATHETERIZATION Left 03/14/2015   Procedure: Left Heart Cath;  Surgeon: Dionisio David, MD;  Location: [facility];  Service: Cardiovascular;  Laterality: Left;  . EP IMPLANTABLE DEVICE N/A 07/16/2016   Procedure: BiV ICD Insertion CRT-D;  Surgeon: Deboraha Sprang, MD;  Location: [facility];  Service: Cardiovascular;  Laterality: N/A;  . TUBAL LIGATION Bilateral     Current Outpatient Prescriptions  Medication Sig Dispense Refill  . albuterol (PROVENTIL HFA;VENTOLIN HFA) 108 (90 BASE) MCG/ACT inhaler Inhale 2 puffs into the lungs every 6 (six) hours as needed for wheezing.    . carvedilol (COREG) 25 MG tablet Take 25 mg by mouth 2 (two) times daily with a meal.    . digoxin (LANOXIN) 0.125 MG tablet Take 0.125 mg by mouth daily.    . furosemide (LASIX) 20 MG tablet Take 20 mg by mouth daily.    [name] ibuprofen (ADVIL,MOTRIN) 200 MG tablet Take 600 mg by mouth every 6 (six) hours as needed for mild pain.    . Menthol, Topical Analgesic, (BENGAY EX) Apply 1 application topically daily as needed. Back pain    . Multiple Vitamin (MULTIVITAMIN WITH MINERALS) TABS tablet Take 1 tablet by  mouth daily.    . sacubitril-valsartan (ENTRESTO) 97-103 MG Take 1 tablet by mouth 2 (two) times daily.    [name] spironolactone (ALDACTONE) 25 MG tablet Take 1 tablet (25 mg total) by mouth daily. 30 tablet 0   No current facility-administered medications for this visit.     No Known Allergies    Review of Systems negative except from HPI and PMH  Physical Exam BP 110/60 (BP Location: Left Arm, Patient Position: Sitting, Cuff Size: Normal)   Pulse (!) 58   Ht 5\' 3"  (1.6 m)   Wt 174 lb 12 oz (79.3 kg)   BMI 30.96 kg/m  Well developed and well nourished in no acute distress HENT normal E scleral and icterus clear Neck Supple JVP flat; carotids brisk and full Clear to ausculation Device pocket well healed; without hematoma or erythema.  There is no tethering Regular rate and rhythm, no murmurs gallops or rub Soft with active bowel sounds No clubbing cyanosis  Edema Alert and oriented, grossly normal motor and sensory function Skin Warm and Dry  ECG demonstrates sinus rhythm. Synchronous pacing at 56 Intervals 16/15/42 QRS negative lead 1 and negative lead V1 Pre-implant QRS 170 ms Assessment and  Plan CRT-D-St. Jude's  Nonischemic cardiomyopathy  Left bundle branch block  Congestive heart failure-chronic-systolic  class IIb    The patient is significantly improved going from class III--class II.  There has been interval normalization of LV systolic function; hence, we will discontinue digoxin and I will also ask her to discontinue her Aldactone and a couple of weeks assuming that there is no deterioration following discontinuation of digoxin.   Current medicines are reviewed at length with the patient today .  The patient does not  have concerns regarding medicines.

## 2016-10-16 NOTE — Progress Notes (Signed)
Patient referred to [facility] clinic by Levander Campion, RN/Dr Caryl Comes.  ICM program intro given in office and she agreed to monthly follow up.  Provided ICM direct number.  Patient weighs daily and feeling good at this time.  1st ICM remote transmission scheduled for 11/16/2016.  Encouraged to call for fluid symptoms

## 2016-11-22 ENCOUNTER — Telehealth: Payer: Self-pay

## 2016-11-22 ENCOUNTER — Ambulatory Visit (INDEPENDENT_AMBULATORY_CARE_PROVIDER_SITE_OTHER): Payer: BLUE CROSS/BLUE SHIELD

## 2016-11-22 DIAGNOSIS — Z95810 Presence of automatic (implantable) cardiac defibrillator: Secondary | ICD-10-CM | POA: Diagnosis not present

## 2016-11-22 DIAGNOSIS — I5022 Chronic systolic (congestive) heart failure: Secondary | ICD-10-CM

## 2016-11-22 NOTE — Progress Notes (Signed)
EPIC Encounter for ICM Monitoring  Patient Name: Margaret Hendricks is a 61 y.o. female Date: 11/22/2016 Primary Care Physican: Willa Frater, MD Primary Cardiologist: Caryl Comes Electrophysiologist: Caryl Comes Dry Weight:  unknown Bi-V Pacing:  98.3%       Attempted call to patient and unable to reach.  Left message to return call.  Transmission reviewed.   Thoracic impedance abnormal suggesting fluid accumulation since 10/18/2016 but is starting to trend toward baseline.  Labs: 07/03/2016 Creatinine 0.80, BUN 15, Potassium 4.4, Sodium 140  Recommendations: NONE - Unable to reach patient   Follow-up plan: ICM clinic phone appointment on 12/11/2016 to repeat fluid levels.  Copy of ICM check sent to device physician.   3 month ICM trend: 11/22/2016   1 Year ICM trend:      [name], RN 11/22/2016 5:17 PM

## 2016-11-22 NOTE — Telephone Encounter (Signed)
Remote ICM transmission received.  Attempted patient call and left message to return call.   

## 2016-12-11 ENCOUNTER — Ambulatory Visit (INDEPENDENT_AMBULATORY_CARE_PROVIDER_SITE_OTHER): Payer: BLUE CROSS/BLUE SHIELD

## 2016-12-11 ENCOUNTER — Telehealth: Payer: Self-pay

## 2016-12-11 DIAGNOSIS — Z95810 Presence of automatic (implantable) cardiac defibrillator: Secondary | ICD-10-CM

## 2016-12-11 DIAGNOSIS — I5022 Chronic systolic (congestive) heart failure: Secondary | ICD-10-CM

## 2016-12-11 NOTE — Telephone Encounter (Signed)
Remote ICM transmission received.  Attempted patient call and person answering phone stated she was not home.   

## 2016-12-11 NOTE — Progress Notes (Signed)
EPIC Encounter for ICM Monitoring  Patient Name: Margaret Hendricks is a 61 y.o. female Date: 12/11/2016 Primary Care Physican: Willa Frater, MD Primary Cardiologist: Caryl Comes Electrophysiologist: Caryl Comes Dry Weight:     unknown Bi-V Pacing:  98.4%      Attempted call to patient and unable to reach.  Transmission reviewed.    Thoracic impedance abnormal suggesting fluid accumulation.  Labs: 07/03/2016 Creatinine 0.80, BUN 15, Potassium 4.4, Sodium 140  Takes Furosemide 20 mg 1 tablet daily  Recommendations: NONE - Unable to reach patient   Follow-up plan: ICM clinic phone appointment on 3/12018 to recheck fluid levels.  Copy of ICM check sent to device physician.   3 month ICM trend: 12/11/2016   1 Year ICM trend:      [name], RN 12/11/2016 8:40 AM

## 2016-12-20 ENCOUNTER — Encounter: Payer: Self-pay | Admitting: Oncology

## 2016-12-20 ENCOUNTER — Inpatient Hospital Stay: Payer: BLUE CROSS/BLUE SHIELD | Attending: Oncology | Admitting: Oncology

## 2016-12-20 VITALS — BP 96/53 | HR 61 | Temp 97.4°F | Wt 173.0 lb

## 2016-12-20 DIAGNOSIS — F17210 Nicotine dependence, cigarettes, uncomplicated: Secondary | ICD-10-CM | POA: Insufficient documentation

## 2016-12-20 DIAGNOSIS — D649 Anemia, unspecified: Secondary | ICD-10-CM

## 2016-12-20 DIAGNOSIS — R5383 Other fatigue: Secondary | ICD-10-CM | POA: Insufficient documentation

## 2016-12-20 DIAGNOSIS — R5381 Other malaise: Secondary | ICD-10-CM | POA: Diagnosis not present

## 2016-12-20 DIAGNOSIS — I420 Dilated cardiomyopathy: Secondary | ICD-10-CM | POA: Insufficient documentation

## 2016-12-20 DIAGNOSIS — J449 Chronic obstructive pulmonary disease, unspecified: Secondary | ICD-10-CM | POA: Insufficient documentation

## 2016-12-20 DIAGNOSIS — I5022 Chronic systolic (congestive) heart failure: Secondary | ICD-10-CM | POA: Insufficient documentation

## 2016-12-20 DIAGNOSIS — I447 Left bundle-branch block, unspecified: Secondary | ICD-10-CM

## 2016-12-20 DIAGNOSIS — Z79899 Other long term (current) drug therapy: Secondary | ICD-10-CM | POA: Diagnosis not present

## 2016-12-20 DIAGNOSIS — E119 Type 2 diabetes mellitus without complications: Secondary | ICD-10-CM | POA: Insufficient documentation

## 2016-12-20 LAB — IRON AND TIBC
Iron: 57 ug/dL (ref 28–170)
Saturation Ratios: 17 % (ref 10.4–31.8)
TIBC: 346 ug/dL (ref 250–450)
UIBC: 289 ug/dL

## 2016-12-20 LAB — COMPREHENSIVE METABOLIC PANEL
ALBUMIN: 4 g/dL (ref 3.5–5.0)
ALK PHOS: 46 U/L (ref 38–126)
ALT: 17 U/L (ref 14–54)
ANION GAP: 6 (ref 5–15)
AST: 23 U/L (ref 15–41)
BILIRUBIN TOTAL: 0.5 mg/dL (ref 0.3–1.2)
BUN: 19 mg/dL (ref 6–20)
CALCIUM: 9 mg/dL (ref 8.9–10.3)
CO2: 27 mmol/L (ref 22–32)
Chloride: 103 mmol/L (ref 101–111)
Creatinine, Ser: 0.77 mg/dL (ref 0.44–1.00)
GFR calc Af Amer: >60 mL/min (ref >60)
GLUCOSE: 113 mg/dL — AB (ref 65–99)
Potassium: 4.3 mmol/L (ref 3.5–5.1)
Sodium: 136 mmol/L (ref 135–145)
TOTAL PROTEIN: 7.4 g/dL (ref 6.5–8.1)

## 2016-12-20 LAB — TSH: TSH: 1.759 u[IU]/mL (ref 0.350–4.500)

## 2016-12-20 LAB — CBC
HEMATOCRIT: 29.6 % — AB (ref 35.0–47.0)
Hemoglobin: 10.1 g/dL — ABNORMAL LOW (ref 12.0–16.0)
MCH: 30.5 pg (ref 26.0–34.0)
MCHC: 34.1 g/dL (ref 32.0–36.0)
MCV: 89.6 fL (ref 80.0–100.0)
Platelets: 260 10*3/uL (ref 150–440)
RBC: 3.3 MIL/uL — ABNORMAL LOW (ref 3.80–5.20)
RDW: 14.3 % (ref 11.5–14.5)
WBC: 6.6 10*3/uL (ref 3.6–11.0)

## 2016-12-20 LAB — PATHOLOGIST SMEAR REVIEW

## 2016-12-20 LAB — RETICULOCYTES
RBC.: 3.3 MIL/uL — ABNORMAL LOW (ref 3.80–5.20)
RETIC COUNT ABSOLUTE: 49.5 10*3/uL (ref 19.0–183.0)
Retic Ct Pct: 1.5 % (ref 0.4–3.1)

## 2016-12-20 LAB — FOLATE: FOLATE: 58.8 ng/mL (ref >5.9)

## 2016-12-20 LAB — VITAMIN B12: Vitamin B-12: 389 pg/mL (ref 180–914)

## 2016-12-20 LAB — SEDIMENTATION RATE: Sed Rate: 63 mm/hr — ABNORMAL HIGH (ref 0–30)

## 2016-12-20 LAB — FERRITIN: FERRITIN: 96 ng/mL (ref 11–307)

## 2016-12-20 NOTE — Progress Notes (Signed)
Patient ambulates without assistance, brought to exam room 6, accompanied by family.  Patient denies pain or discomfort at this time.  Vitals documented

## 2016-12-20 NOTE — Progress Notes (Signed)
Hematology/Oncology Consult note [facility] Telephone:([phone] [phone]  Patient Care Team: Willa Frater, MD as PCP - General (Internal Medicine)   Name of the patient: Margaret Hendricks  [id number]  [birth year]    Reason for referral- normocytic anemia   Referring physician- Dr. Aundra Dubin  Date of visit: 12/20/16   History of presenting illness- patient is a 61 year old female with past medical history significant for dilated cardiomyopathy status postAICD placement and patient reports that her most recent EF was 55%. She also has diabetes among other medical problems. Based on her recent CBC from 11/26/2016 patient was noted to have a white count of 6.7, H&H of 10.4/31.9 and platelet count of 276. Ferritin was normal at 128. Hemoglobin A1c was 6.3. MCV was 90.0 patient reports that her ejection fraction was 25% in the past and she used to feel significant fatigue at that time but since the placement of her AICD for energy levels are improving. She otherwise feels well and denies other complaints today  ECOG PS- 1  Pain scale- 2   Review of systems- Review of Systems  Constitutional: Positive for malaise/fatigue. Negative for chills, fever and weight loss.  HENT: Negative for congestion, ear discharge and nosebleeds.   Eyes: Negative for blurred vision.  Respiratory: Negative for cough, hemoptysis, sputum production, shortness of breath and wheezing.   Cardiovascular: Negative for chest pain, palpitations, orthopnea and claudication.  Gastrointestinal: Negative for abdominal pain, blood in stool, constipation, diarrhea, heartburn, melena, nausea and vomiting.  Genitourinary: Negative for dysuria, flank pain, frequency, hematuria and urgency.  Musculoskeletal: Negative for back pain, joint pain and myalgias.  Skin: Negative for rash.  Neurological: Negative for dizziness, tingling, focal weakness, seizures, weakness and headaches.    Endo/Heme/Allergies: Does not bruise/bleed easily.  Psychiatric/Behavioral: Negative for depression and suicidal ideas. The patient does not have insomnia.     No Known Allergies  Patient Active Problem List   Diagnosis Date Noted  . LBBB (left bundle branch block) 07/03/2016  . NICM (nonischemic cardiomyopathy) ([place]) 07/03/2016  . Chronic systolic CHF (congestive heart failure), NYHA class 2- 3 ([place]) 07/03/2016  . COPD, moderate ([place]) 03/12/2015  . Tobacco abuse 01/04/2015     Past Medical History:  Diagnosis Date  . Abnormal heart rhythm   . Anemia   . Chronic systolic CHF (congestive heart failure), NYHA class 2- 3 ([place]) 07/03/2016  . COPD (chronic obstructive pulmonary disease) ([place])   . Diabetes mellitus without complication ([place])   . LBBB (left bundle branch block) 07/03/2016  . NICM (nonischemic cardiomyopathy) ([place]) 07/03/2016  . Shortness of breath      Past Surgical History:  Procedure Laterality Date  . APPENDECTOMY    . CARDIAC CATHETERIZATION Left 03/14/2015   Procedure: Left Heart Cath;  Surgeon: Dionisio David, MD;  Location: [place] [facility];  Service: Cardiovascular;  Laterality: Left;  . EP IMPLANTABLE DEVICE N/A 07/16/2016   Procedure: BiV ICD Insertion CRT-D;  Surgeon: Deboraha Sprang, MD;  Location: [place] [facility];  Service: Cardiovascular;  Laterality: N/A;  . ICD IMPLANT    . TUBAL LIGATION Bilateral     Social History   Social History  . Marital status: Married    Spouse name: N/A  . Number of children: N/A  . Years of education: N/A   Occupational History  . Not on file.   Social History Main Topics  . Smoking status: Current Every Day Smoker    Years: 30.00  Types: Cigarettes    Last attempt to quit: 01/28/2015  . Smokeless tobacco: Never Used     Comment: Madysin admits she smokes 1/2 cigarette in am on the way to work and 1/2 cigarette on the way home from work.   . Alcohol use No  . Drug use: Yes    Types: Fentanyl  . Sexual  activity: Not on file   Other Topics Concern  . Not on file   Social History Narrative  . No narrative on file     Family History  Problem Relation Age of Onset  . Hypertension    . Heart attack       Current Outpatient Prescriptions:  .  albuterol (PROVENTIL HFA;VENTOLIN HFA) 108 (90 BASE) MCG/ACT inhaler, Inhale 2 puffs into the lungs every 6 (six) hours as needed for wheezing., Disp: , Rfl:  .  carvedilol (COREG) 25 MG tablet, Take 25 mg by mouth 2 (two) times daily with a meal., Disp: , Rfl:  .  furosemide (LASIX) 20 MG tablet, Take 20 mg by mouth daily., Disp: , Rfl:  .  ibuprofen (ADVIL,MOTRIN) 200 MG tablet, Take 600 mg by mouth every 6 (six) hours as needed for mild pain., Disp: , Rfl:  .  Menthol, Topical Analgesic, (BENGAY EX), Apply 1 application topically daily as needed. Back pain, Disp: , Rfl:  .  Multiple Vitamin (MULTIVITAMIN WITH MINERALS) TABS tablet, Take 1 tablet by mouth daily., Disp: , Rfl:  .  sacubitril-valsartan (ENTRESTO) 97-103 MG, Take 1 tablet by mouth 2 (two) times daily., Disp: , Rfl:  .  spironolactone (ALDACTONE) 25 MG tablet, Take 25 mg by mouth daily., Disp: , Rfl:    Physical exam:  Vitals:   12/20/16 0937  BP: (!) 96/53  Pulse: 61  Temp: 97.4 F (36.3 C)  TempSrc: Tympanic  Weight: 172 lb 15.2 oz (78.4 kg)   Physical Exam  Constitutional: She is oriented to person, place, and time and well-developed, well-nourished, and in no distress.  HENT:  Head: Normocephalic and atraumatic.  Eyes: EOM are normal. Pupils are equal, round, and reactive to light.  Neck: Normal range of motion.  Cardiovascular: Normal rate, regular rhythm and normal heart sounds.   Pulmonary/Chest: Effort normal and breath sounds normal.  Abdominal: Soft. Bowel sounds are normal.  Neurological: She is alert and oriented to person, place, and time.  Skin: Skin is warm and dry.       CMP Latest Ref Rng & Units 12/20/2016  Glucose 65 - 99 mg/dL 113(H)  BUN 6 - 20  mg/dL 19  Creatinine 0.44 - 1.00 mg/dL 0.77  Sodium 135 - 145 mmol/L 136  Potassium 3.5 - 5.1 mmol/L 4.3  Chloride 101 - 111 mmol/L 103  CO2 22 - 32 mmol/L 27  Calcium 8.9 - 10.3 mg/dL 9.0  Total Protein 6.5 - 8.1 g/dL 7.4  Total Bilirubin 0.3 - 1.2 mg/dL 0.5  Alkaline Phos 38 - 126 U/L 46  AST 15 - 41 U/L 23  ALT 14 - 54 U/L 17   CBC Latest Ref Rng & Units 12/20/2016  WBC 3.6 - 11.0 K/uL 6.6  Hemoglobin 12.0 - 16.0 g/dL 10.1(L)  Hematocrit 35.0 - 47.0 % 29.6(L)  Platelets 150 - 440 K/uL 260     Assessment and plan- Patient is a 61 y.o. female who has been referred to [place] for evaluation and management of normocytic anemia  Normocytic anemia could be secondary to underlying chronic disease. Today I will get a  comprehensive workup for anemia including a repeat CBC, CMP, ferritin and iron studies, B12, folate, LDH, haptoglobin and reticulocyte count. I will check her TSH and ESR as well as myeloma panel. I will see her back in 2 weeks' time to discuss the results of her blood work and further management   Thank you for this kind referral and the opportunity to participate in the care of this patient   Visit Diagnosis 1. Normocytic anemia     Dr. Randa Evens, MD, MPH [place] [facility] Pager- [phone] 12/20/2016  1:48 PM

## 2016-12-21 LAB — HAPTOGLOBIN: HAPTOGLOBIN: 169 mg/dL (ref 34–200)

## 2016-12-25 ENCOUNTER — Telehealth: Payer: Self-pay | Admitting: *Deleted

## 2016-12-25 LAB — MULTIPLE MYELOMA PANEL, SERUM
ALPHA2 GLOB SERPL ELPH-MCNC: 0.6 g/dL (ref 0.4–1.0)
Albumin SerPl Elph-Mcnc: 3.9 g/dL (ref 2.9–4.4)
Albumin/Glob SerPl: 1.4 (ref 0.7–1.7)
Alpha 1: 0.1 g/dL (ref 0.0–0.4)
B-Globulin SerPl Elph-Mcnc: 1.3 g/dL (ref 0.7–1.3)
Gamma Glob SerPl Elph-Mcnc: 0.7 g/dL (ref 0.4–1.8)
Globulin, Total: 2.8 g/dL (ref 2.2–3.9)
IGG (IMMUNOGLOBIN G), SERUM: 887 mg/dL (ref 700–1600)
IGM, SERUM: 110 mg/dL (ref 26–217)
IgA: 644 mg/dL — ABNORMAL HIGH (ref 87–352)
M Protein SerPl Elph-Mcnc: 0.3 g/dL — ABNORMAL HIGH
Total Protein ELP: 6.7 g/dL (ref 6.0–8.5)

## 2016-12-25 NOTE — Telephone Encounter (Signed)
Received referral for low dose lung cancer screening CT scan. Voicemail left at phone number listed in EMR for patient to call me back to facilitate scheduling scan.  

## 2016-12-27 ENCOUNTER — Ambulatory Visit (INDEPENDENT_AMBULATORY_CARE_PROVIDER_SITE_OTHER): Payer: BLUE CROSS/BLUE SHIELD

## 2016-12-27 DIAGNOSIS — Z95810 Presence of automatic (implantable) cardiac defibrillator: Secondary | ICD-10-CM

## 2016-12-27 DIAGNOSIS — I5022 Chronic systolic (congestive) heart failure: Secondary | ICD-10-CM | POA: Diagnosis not present

## 2016-12-27 NOTE — Progress Notes (Signed)
EPIC Encounter for ICM Monitoring  Patient Name: Margaret Hendricks is a 61 y.o. female Date: 12/27/2016 Primary Care Physican: Willa Frater, MD Primary Cardiologist: Caryl Comes Electrophysiologist: Caryl Comes Dry Weight:unknown Bi-V Pacing: 98.4%                Attempted call to patient and unable to reach.   Transmission reviewed.    Thoracic impedance at baseline today.  Prescribed dosage: Furosemide 20 mg 1 tablet daily  Labs: 07/03/2016 Creatinine 0.80, BUN 15, Potassium 4.4, Sodium 140  Recommendations: NONE - Unable to reach patient   Follow-up plan: ICM clinic phone appointment on 01/28/2017.  Copy of ICM check sent to device physician.   3 month ICM trend: 12/27/2016   1 Year ICM trend:      [name], RN 12/27/2016 3:34 PM

## 2017-01-08 ENCOUNTER — Inpatient Hospital Stay: Payer: BLUE CROSS/BLUE SHIELD

## 2017-01-08 ENCOUNTER — Ambulatory Visit
Admission: RE | Admit: 2017-01-08 | Discharge: 2017-01-08 | Disposition: A | Discharge status: Home / Self Care | Payer: BLUE CROSS/BLUE SHIELD | Source: Ambulatory Visit | Attending: Oncology | Admitting: Oncology

## 2017-01-08 ENCOUNTER — Telehealth: Payer: Self-pay | Admitting: *Deleted

## 2017-01-08 ENCOUNTER — Inpatient Hospital Stay: Payer: BLUE CROSS/BLUE SHIELD | Attending: Oncology | Admitting: Oncology

## 2017-01-08 VITALS — BP 113/69 | HR 78 | Temp 97.2°F | Resp 18 | Wt 172.0 lb

## 2017-01-08 DIAGNOSIS — F17210 Nicotine dependence, cigarettes, uncomplicated: Secondary | ICD-10-CM | POA: Insufficient documentation

## 2017-01-08 DIAGNOSIS — I420 Dilated cardiomyopathy: Secondary | ICD-10-CM | POA: Insufficient documentation

## 2017-01-08 DIAGNOSIS — D472 Monoclonal gammopathy: Secondary | ICD-10-CM

## 2017-01-08 DIAGNOSIS — I447 Left bundle-branch block, unspecified: Secondary | ICD-10-CM | POA: Insufficient documentation

## 2017-01-08 DIAGNOSIS — Z87891 Personal history of nicotine dependence: Secondary | ICD-10-CM

## 2017-01-08 DIAGNOSIS — I5022 Chronic systolic (congestive) heart failure: Secondary | ICD-10-CM | POA: Insufficient documentation

## 2017-01-08 DIAGNOSIS — D890 Polyclonal hypergammaglobulinemia: Secondary | ICD-10-CM | POA: Diagnosis not present

## 2017-01-08 DIAGNOSIS — E119 Type 2 diabetes mellitus without complications: Secondary | ICD-10-CM | POA: Insufficient documentation

## 2017-01-08 DIAGNOSIS — Z79899 Other long term (current) drug therapy: Secondary | ICD-10-CM | POA: Insufficient documentation

## 2017-01-08 DIAGNOSIS — I700 Atherosclerosis of aorta: Secondary | ICD-10-CM | POA: Diagnosis not present

## 2017-01-08 DIAGNOSIS — D649 Anemia, unspecified: Secondary | ICD-10-CM | POA: Diagnosis not present

## 2017-01-08 DIAGNOSIS — M5030 Other cervical disc degeneration, unspecified cervical region: Secondary | ICD-10-CM | POA: Insufficient documentation

## 2017-01-08 DIAGNOSIS — M5134 Other intervertebral disc degeneration, thoracic region: Secondary | ICD-10-CM | POA: Diagnosis not present

## 2017-01-08 DIAGNOSIS — M5136 Other intervertebral disc degeneration, lumbar region: Secondary | ICD-10-CM | POA: Insufficient documentation

## 2017-01-08 DIAGNOSIS — J449 Chronic obstructive pulmonary disease, unspecified: Secondary | ICD-10-CM | POA: Diagnosis not present

## 2017-01-08 LAB — LACTATE DEHYDROGENASE: LDH: 155 U/L (ref 98–192)

## 2017-01-08 NOTE — Progress Notes (Signed)
Here for follow up. Stated doing well  

## 2017-01-08 NOTE — Progress Notes (Signed)
Hematology/Oncology Consult note [facility]  Telephone:([phone] [phone]  Patient Care Team: Willa Frater, MD as PCP - General (Internal Medicine)   Name of the patient: Margaret Hendricks  [id number]  11-06-1955   Date of visit: 01/08/17  Diagnosis- normocytic anemia likely due to chronic disease  Chief complaint/ Reason for visit- discuss results of bloodwork  Heme/Onc history: patient is a 61 year old female with past medical history significant for dilated cardiomyopathy status postAICD placement and patient reports that her most recent EF was 55%. She also has diabetes among other medical problems. Based on her recent CBC from 11/26/2016 patient was noted to have a white count of 6.7, H&H of 10.4/31.9 and platelet count of 276. Ferritin was normal at 128. Hemoglobin A1c was 6.3. MCV was 90.0 patient reports that her ejection fraction was 25% in the past and she used to feel significant fatigue at that time but since the placement of her AICD for energy levels are improving. She otherwise feels well and denies other complaints today  Results of blood work from 12/20/2016 were as follows: CBC showed white count of 6.6, H&H of 10.1/29.6 and a platelet count of 216. Pathology review of smear showed normocytic anemia and no other abnormal findings. CMP was within normal limits. Reticulocyte count was 1.5% which was low for the degree of anemia. B12 and folate were within normal limits. TSH was normal at 1.75. Multiple myeloma panel showed [place] IgA monoclonal protein of 0.3 g with kappa light chain specificity. Iron studies were within normal limits. ESR was elevated at 63.   Interval history- doing well. Denies any complaints    Review of systems- Review of Systems  Constitutional: Negative for chills, fever, malaise/fatigue and weight loss.  HENT: Negative for congestion, ear discharge and nosebleeds.   Eyes: Negative for blurred vision.    Respiratory: Negative for cough, hemoptysis, sputum production, shortness of breath and wheezing.   Cardiovascular: Negative for chest pain, palpitations, orthopnea and claudication.  Gastrointestinal: Negative for abdominal pain, blood in stool, constipation, diarrhea, heartburn, melena, nausea and vomiting.  Genitourinary: Negative for dysuria, flank pain, frequency, hematuria and urgency.  Musculoskeletal: Negative for back pain, joint pain and myalgias.  Skin: Negative for rash.  Neurological: Negative for dizziness, tingling, focal weakness, seizures, weakness and headaches.  Endo/Heme/Allergies: Does not bruise/bleed easily.  Psychiatric/Behavioral: Negative for depression and suicidal ideas. The patient does not have insomnia.      Current treatment- observation  No Known Allergies   Past Medical History:  Diagnosis Date  . Abnormal heart rhythm   . Anemia   . Chronic systolic CHF (congestive heart failure), NYHA class 2- 3 ([place]) 07/03/2016  . COPD (chronic obstructive pulmonary disease) ([place])   . Diabetes mellitus without complication ([place])   . LBBB (left bundle branch block) 07/03/2016  . NICM (nonischemic cardiomyopathy) ([place]) 07/03/2016  . Shortness of breath      Past Surgical History:  Procedure Laterality Date  . APPENDECTOMY    . CARDIAC CATHETERIZATION Left 03/14/2015   Procedure: Left Heart Cath;  Surgeon: Dionisio David, MD;  Location: [place] [facility];  Service: Cardiovascular;  Laterality: Left;  . EP IMPLANTABLE DEVICE N/A 07/16/2016   Procedure: BiV ICD Insertion CRT-D;  Surgeon: Deboraha Sprang, MD;  Location: [place] [facility];  Service: Cardiovascular;  Laterality: N/A;  . ICD IMPLANT    . TUBAL LIGATION Bilateral     Social History   Social History  .  Marital status: Married    Spouse name: N/A  . Number of children: N/A  . Years of education: N/A   Occupational History  . Not on file.   Social History Main Topics  . Smoking status: Current  Every Day Smoker    Years: 30.00    Types: Cigarettes    Last attempt to quit: 01/28/2015  . Smokeless tobacco: Never Used     Comment: Sharelle admits she smokes 1/2 cigarette in am on the way to work and 1/2 cigarette on the way home from work.   . Alcohol use No  . Drug use: Yes    Types: Fentanyl  . Sexual activity: Not on file   Other Topics Concern  . Not on file   Social History Narrative  . No narrative on file    Family History  Problem Relation Age of Onset  . Hypertension    . Heart attack       Current Outpatient Prescriptions:  .  albuterol (PROVENTIL HFA;VENTOLIN HFA) 108 (90 BASE) MCG/ACT inhaler, Inhale 2 puffs into the lungs every 6 (six) hours as needed for wheezing., Disp: , Rfl:  .  carvedilol (COREG) 25 MG tablet, Take 25 mg by mouth 2 (two) times daily with a meal., Disp: , Rfl:  .  furosemide (LASIX) 20 MG tablet, Take 20 mg by mouth daily., Disp: , Rfl:  .  ibuprofen (ADVIL,MOTRIN) 200 MG tablet, Take 600 mg by mouth every 6 (six) hours as needed for mild pain., Disp: , Rfl:  .  Menthol, Topical Analgesic, (BENGAY EX), Apply 1 application topically daily as needed. Back pain, Disp: , Rfl:  .  Multiple Vitamin (MULTIVITAMIN WITH MINERALS) TABS tablet, Take 1 tablet by mouth daily., Disp: , Rfl:  .  sacubitril-valsartan (ENTRESTO) 97-103 MG, Take 1 tablet by mouth 2 (two) times daily., Disp: , Rfl:  .  spironolactone (ALDACTONE) 25 MG tablet, Take 25 mg by mouth daily., Disp: , Rfl:   Physical exam:  Vitals:   01/08/17 1110  BP: 113/69  Pulse: 78  Resp: 18  Temp: 97.2 F (36.2 C)  TempSrc: Tympanic  Weight: 171 lb 15.3 oz (78 kg)   Physical Exam  Constitutional: She is oriented to person, place, and time and well-developed, well-nourished, and in no distress.  HENT:  Head: Normocephalic and atraumatic.  Eyes: EOM are normal. Pupils are equal, round, and reactive to light.  Neck: Normal range of motion.  Cardiovascular: Normal rate and regular  rhythm.   Murmur (systolic murmur +) heard. Pulmonary/Chest: Effort normal and breath sounds normal.  Abdominal: Soft. Bowel sounds are normal.  Neurological: She is alert and oriented to person, place, and time.  Skin: Skin is warm and dry.     CMP Latest Ref Rng & Units 12/20/2016  Glucose 65 - 99 mg/dL 113(H)  BUN 6 - 20 mg/dL 19  Creatinine 0.44 - 1.00 mg/dL 0.77  Sodium 135 - 145 mmol/L 136  Potassium 3.5 - 5.1 mmol/L 4.3  Chloride 101 - 111 mmol/L 103  CO2 22 - 32 mmol/L 27  Calcium 8.9 - 10.3 mg/dL 9.0  Total Protein 6.5 - 8.1 g/dL 7.4  Total Bilirubin 0.3 - 1.2 mg/dL 0.5  Alkaline Phos 38 - 126 U/L 46  AST 15 - 41 U/L 23  ALT 14 - 54 U/L 17   CBC Latest Ref Rng & Units 12/20/2016  WBC 3.6 - 11.0 K/uL 6.6  Hemoglobin 12.0 - 16.0 g/dL 10.1(L)  Hematocrit 35.0 -  47.0 % 29.6(L)  Platelets 150 - 440 K/uL 260      Assessment and plan- Patient is a 61 y.o. female who was referred to [place] for normocytic anemia likely anemia of chronic disease  I discussed the results of the blood work with the patient which were essentially unrevealing except for myeloma panel which showed 0.3 g of monoclonal IgA protein and elevated IgA level of 644. I doubt that this is contributing to her anemia.Patient does not have any evidence of renal failure or hypercalcemia. She does have an elevated ESR and the rest of her anemia workup was otherwise unrevealing and I suspect that her anemia is secondary to her chronic disease. However given monoclonal protein which was IgA- I would like to get a complete myeloma workup at this time including serum free light chains, 24-hour urine protein and immunofixation, beta-2 microglobulin and LDH. She will also need a skeletal survey and bone marrow biopsy. I discussed the risks and benefits of bone marrow biopsy including all but not limited to mild pain or discomfort at the site of the biopsy. Patient understands and agrees to proceed.  I will see her back in 3  weeks' time to discuss the results of her bone marrow biopsy and further management   Visit Diagnosis 1. Monoclonal (M) protein disease, multiple 'M' protein   2. Normocytic anemia      Dr. Randa Evens, MD, MPH [facility] Pager- [phone] 01/08/2017 11:37 AM

## 2017-01-08 NOTE — Telephone Encounter (Signed)
Received referral for initial lung cancer screening scan. Contacted patient and obtained smoking history,(current, 30 pack year) as well as answering questions related to screening process. Patient denies signs of lung cancer such as weight loss or hemoptysis. Patient denies comorbidity that would prevent curative treatment if lung cancer were found. Patient is tentatively scheduled for shared decision making visit and CT scan on 01/15/17, pending insurance approval from business office.

## 2017-01-09 DIAGNOSIS — D649 Anemia, unspecified: Secondary | ICD-10-CM | POA: Diagnosis not present

## 2017-01-09 LAB — KAPPA/LAMBDA LIGHT CHAINS
Kappa free light chain: 23.7 mg/L — ABNORMAL HIGH (ref 3.3–19.4)
Kappa, lambda light chain ratio: 1.82 — ABNORMAL HIGH (ref 0.26–1.65)
Lambda free light chains: 13 mg/L (ref 5.7–26.3)

## 2017-01-09 LAB — BETA 2 MICROGLOBULIN, SERUM: Beta-2 Microglobulin: 1.6 mg/L (ref 0.6–2.4)

## 2017-01-10 ENCOUNTER — Other Ambulatory Visit: Payer: Self-pay

## 2017-01-10 DIAGNOSIS — D649 Anemia, unspecified: Secondary | ICD-10-CM

## 2017-01-10 DIAGNOSIS — D890 Polyclonal hypergammaglobulinemia: Principal | ICD-10-CM

## 2017-01-10 DIAGNOSIS — D472 Monoclonal gammopathy: Secondary | ICD-10-CM

## 2017-01-14 LAB — IFE+PROTEIN ELECTRO, 24-HR UR
% BETA, URINE: 0 %
ALBUMIN, U: 100 %
ALPHA 1 URINE: 0 %
ALPHA 2 UR: 0 %
GAMMA GLOBULIN URINE: 0 %
Total Protein, Urine-Ur/day: 75 mg/24 hr (ref 30–150)
Total Protein, Urine: 4.9 mg/dL
Total Volume: 1525

## 2017-01-15 ENCOUNTER — Inpatient Hospital Stay: Payer: BLUE CROSS/BLUE SHIELD | Admitting: Oncology

## 2017-01-15 ENCOUNTER — Ambulatory Visit: Payer: BLUE CROSS/BLUE SHIELD

## 2017-01-21 ENCOUNTER — Telehealth: Payer: Self-pay | Admitting: *Deleted

## 2017-01-21 ENCOUNTER — Encounter: Payer: Self-pay | Admitting: *Deleted

## 2017-01-21 ENCOUNTER — Other Ambulatory Visit: Payer: Self-pay | Admitting: Radiology

## 2017-01-21 NOTE — Telephone Encounter (Signed)
I wanted to make sure that someone called her about BM bx. And it looked like someone had emailed her but she said she has been out of town and she got the info today to arrive in am 8 am and I went over to make sure she was NPO and she knew that, bring driver and take any meds with sip of water she knew that also. [facility] and check in at adm/reg. Desk. She will do that in am. I also told her that I would check to see if 4/3 would be enough time to get results , I will talk to Janese Banks and if she is ok with date then it will be fine and if not I will call pt. She is agreeable to the plan.

## 2017-01-22 ENCOUNTER — Other Ambulatory Visit (HOSPITAL_COMMUNITY)
Admission: RE | Admit: 2017-01-22 | Discharge: 2017-01-22 | Disposition: A | Discharge status: Home / Self Care | Payer: BLUE CROSS/BLUE SHIELD | Source: Other Acute Inpatient Hospital | Attending: Oncology | Admitting: Oncology

## 2017-01-22 ENCOUNTER — Telehealth: Payer: Self-pay | Admitting: *Deleted

## 2017-01-22 ENCOUNTER — Ambulatory Visit
Admission: RE | Admit: 2017-01-22 | Discharge: 2017-01-22 | Disposition: A | Discharge status: Home / Self Care | Payer: BLUE CROSS/BLUE SHIELD | Source: Ambulatory Visit | Attending: Oncology | Admitting: Oncology

## 2017-01-22 DIAGNOSIS — D890 Polyclonal hypergammaglobulinemia: Secondary | ICD-10-CM | POA: Diagnosis not present

## 2017-01-22 DIAGNOSIS — D649 Anemia, unspecified: Secondary | ICD-10-CM | POA: Diagnosis present

## 2017-01-22 DIAGNOSIS — D472 Monoclonal gammopathy: Secondary | ICD-10-CM

## 2017-01-22 HISTORY — DX: Presence of automatic (implantable) cardiac defibrillator: Z95.810

## 2017-01-22 LAB — CBC WITH DIFFERENTIAL/PLATELET
Basophils Absolute: 0 10*3/uL (ref 0–0.1)
Basophils Relative: 0 %
EOS ABS: 0.1 10*3/uL (ref 0–0.7)
Eosinophils Relative: 2 %
HEMATOCRIT: 31.1 % — AB (ref 35.0–47.0)
HEMOGLOBIN: 10.5 g/dL — AB (ref 12.0–16.0)
LYMPHS ABS: 2.5 10*3/uL (ref 1.0–3.6)
LYMPHS PCT: 38 %
MCH: 30.8 pg (ref 26.0–34.0)
MCHC: 33.7 g/dL (ref 32.0–36.0)
MCV: 91.4 fL (ref 80.0–100.0)
MONOS PCT: 5 %
Monocytes Absolute: 0.3 10*3/uL (ref 0.2–0.9)
NEUTROS ABS: 3.6 10*3/uL (ref 1.4–6.5)
NEUTROS PCT: 55 %
Platelets: 246 10*3/uL (ref 150–440)
RBC: 3.4 MIL/uL — AB (ref 3.80–5.20)
RDW: 15 % — ABNORMAL HIGH (ref 11.5–14.5)
WBC: 6.6 10*3/uL (ref 3.6–11.0)

## 2017-01-22 LAB — PROTIME-INR
INR: 0.98
Prothrombin Time: 13 seconds (ref 11.4–15.2)

## 2017-01-22 LAB — APTT: aPTT: 33 seconds (ref 24–36)

## 2017-01-22 MED ORDER — FENTANYL CITRATE (PF) 100 MCG/2ML IJ SOLN
INTRAMUSCULAR | Status: AC | PRN
  Administered 2017-01-22 (×2): 50 ug via INTRAVENOUS

## 2017-01-22 MED ORDER — MIDAZOLAM HCL 2 MG/2ML IJ SOLN
INTRAMUSCULAR | Status: AC | PRN
  Administered 2017-01-22: 1 mg via INTRAVENOUS

## 2017-01-22 MED ORDER — SODIUM CHLORIDE 0.9 % IV SOLN
INTRAVENOUS | Status: AC | PRN
  Administered 2017-01-22: 10 mL/h via INTRAVENOUS

## 2017-01-22 MED ORDER — SODIUM CHLORIDE 0.9 % IV SOLN: INTRAVENOUS | Status: DC

## 2017-01-22 MED ORDER — HYDROCODONE-ACETAMINOPHEN 5-325 MG PO TABS
1.0000 | ORAL_TABLET | ORAL | Status: DC | PRN
  Filled 2017-01-22: qty 2

## 2017-01-22 NOTE — Telephone Encounter (Signed)
Called pt to see how she did with BM today and she did good. She was sleepy and wen thome and took a nap and feels good now. I told her that I spoke with dr Janese Banks today and she does not think the results will be back by 4/3 so I would like to move her appt out with 4/6, 4/10 or 4/12. She chose 4/12 at 11:15 and I will send message to sch. To make changes.

## 2017-01-22 NOTE — Procedures (Signed)
Under CT guidance, bone marrow aspiration and core biopsy was performed. No immediate complication.

## 2017-01-22 NOTE — Discharge Instructions (Signed)
Needle Biopsy of the Bone °A bone biopsy is a procedure in which a small sample of bone is removed. The sample is taken with a needle. Then, the bone sample is looked at under a microscope to check for abnormalities. The sample is usually taken from a bone that is close to the skin. This procedure may be done to check for various problems with the bone. You may need this procedure if imaging tests or blood tests have indicated a possible problem. This procedure may be done to help determine if a bone tumor is cancerous (malignant). A bone biopsy can help to diagnose problems such as: °· Tumors of the bone (sarcomas) and bone marrow (multiple myeloma). °· Bone that forms abnormally (Paget disease). °· Noncancerous (benign) bone cysts. °· Bony growths. °· Infections in the bone. °Tell a health care provider about: °· Any allergies you have. °· All medicines you are taking, including vitamins, herbs, eye drops, creams, and over-the-counter medicines. °· Any problems you or family members have had with anesthetic medicines. °· Any blood disorders you have. °· Any surgeries you have had. °· Any medical conditions you have. °What are the risks? °Generally, this is a safe procedure. However, problems may occur, including: °· Excessive bleeding. °· Infection. °· Injury to surrounding tissue. °What happens before the procedure? °· Ask your health care provider about: °¨ Changing or stopping your regular medicines. This is especially important if you are taking diabetes medicines or blood thinners. °¨ Taking medicines such as aspirin and ibuprofen. These medicines can thin your blood. Do not take these medicines before your procedure if your health care provider instructs you not to. °· Follow instructions from your health care provider about eating or drinking restrictions. °· Plan to have someone take you home after the procedure. °· If you go home right after the procedure, plan to have someone with you for 24 hours. °What  happens during the procedure? °· An IV tube may be inserted into one of your veins. °· The injection site will be cleaned with a germ-killing solution (antiseptic). °· You will be given one or more of the following: °¨ A medicine to help you relax (sedative). °¨ A medicine to numb the area (local anesthetic). °· The sample of bone will be removed by putting a large needle through the skin and into the bone. °· The needle will be removed. °· A bandage (dressing) will be placed over the insertion site and taped in place. °The procedure may vary among health care providers and hospitals. °What happens after the procedure? °· Your blood pressure, heart rate, breathing rate, and blood oxygen level will be monitored often until the medicines you were given have worn off. °· Return to your normal activities as told by your health care provider. °This information is not intended to replace advice given to you by your health care provider. Make sure you discuss any questions you have with your health care provider. °Document Released: 08/23/2004 Document Revised: 03/22/2016 Document Reviewed: 11/22/2014 °Elsevier Interactive Patient Education © 2017 [employer] ° °

## 2017-01-22 NOTE — Procedures (Signed)
Procedure and risks discussed with patient. Informed consent obtained. Will perform CT-guided bone marrow biopsy and aspiration.

## 2017-01-28 ENCOUNTER — Ambulatory Visit (INDEPENDENT_AMBULATORY_CARE_PROVIDER_SITE_OTHER): Payer: BLUE CROSS/BLUE SHIELD | Admitting: *Deleted

## 2017-01-28 DIAGNOSIS — I5022 Chronic systolic (congestive) heart failure: Secondary | ICD-10-CM | POA: Diagnosis not present

## 2017-01-28 DIAGNOSIS — I428 Other cardiomyopathies: Secondary | ICD-10-CM

## 2017-01-28 DIAGNOSIS — Z95810 Presence of automatic (implantable) cardiac defibrillator: Secondary | ICD-10-CM

## 2017-01-28 NOTE — Progress Notes (Signed)
Remote ICD transmission.   

## 2017-01-29 ENCOUNTER — Encounter: Payer: Self-pay | Admitting: Cardiology

## 2017-01-29 ENCOUNTER — Ambulatory Visit: Payer: BLUE CROSS/BLUE SHIELD | Admitting: Oncology

## 2017-01-29 NOTE — Progress Notes (Signed)
EPIC Encounter for ICM Monitoring  Patient Name: Margaret Hendricks is a 61 y.o. female Date: 01/29/2017 Primary Care Physican: Nino Glow McLean-Scocuzza, MD Primary Cardiologist: Caryl Comes Electrophysiologist: Caryl Comes Dry Weight:167 lbs Bi-V Pacing: 98.4%      Heart Failure questions reviewed, pt asymptomatic now but did have some swelling of the feet and 2 lb weight gain during decreased impedance. Symptoms have resolved/   Thoracic impedance normal.  Prescribed dosage: Furosemide 20 mg 1 tablet daily  Labs: 12/20/2016 Creatinine 0.77, BUN 19, Potassium 4.3, Sodium 136 07/03/2016 Creatinine 0.80, BUN 15, Potassium 4.4, Sodium 140  Recommendations: No changes. Reminded to limit dietary salt intake to 2000 mg/day and fluid intake to < 2 liters/day. Encouraged to call for fluid symptoms.  Follow-up plan: ICM clinic phone appointment on 03/01/2017.    Copy of ICM check sent to device physician.   3 month ICM trend: 01/28/2017   1 Year ICM trend:      [name], RN 01/29/2017 10:19 AM

## 2017-01-30 LAB — CUP PACEART REMOTE DEVICE CHECK
Brady Statistic AP VP Percent: 2.61 %
Brady Statistic AP VS Percent: 0.09 %
Brady Statistic AS VS Percent: 1.46 %
Brady Statistic RV Percent Paced: 0.83 %
HIGH POWER IMPEDANCE MEASURED VALUE: 71 Ohm
Implantable Lead Implant Date: 20170918
Implantable Lead Location: 753858
Implantable Lead Location: 753859
Implantable Pulse Generator Implant Date: 20170918
Lead Channel Impedance Value: 1007 Ohm
Lead Channel Impedance Value: 1007 Ohm
Lead Channel Impedance Value: 1102 Ohm
Lead Channel Impedance Value: 285 Ohm
Lead Channel Impedance Value: 294.194
Lead Channel Impedance Value: 294.194
Lead Channel Impedance Value: 380 Ohm
Lead Channel Impedance Value: 456 Ohm
Lead Channel Impedance Value: 608 Ohm
Lead Channel Impedance Value: 608 Ohm
Lead Channel Impedance Value: 988 Ohm
Lead Channel Impedance Value: 988 Ohm
Lead Channel Pacing Threshold Amplitude: 0.625 V
Lead Channel Pacing Threshold Amplitude: 1.375 V
Lead Channel Pacing Threshold Pulse Width: 0.8 ms
Lead Channel Sensing Intrinsic Amplitude: 16.25 mV
Lead Channel Sensing Intrinsic Amplitude: 16.25 mV
Lead Channel Sensing Intrinsic Amplitude: 2.625 mV
Lead Channel Sensing Intrinsic Amplitude: 2.625 mV
Lead Channel Setting Pacing Amplitude: 2 V
Lead Channel Setting Pacing Amplitude: 2.5 V
Lead Channel Setting Pacing Pulse Width: 0.4 ms
Lead Channel Setting Pacing Pulse Width: 0.8 ms
MDC IDC LEAD IMPLANT DT: 20170918
MDC IDC LEAD IMPLANT DT: 20170918
MDC IDC LEAD LOCATION: 753860
MDC IDC MSMT BATTERY REMAINING LONGEVITY: 111 mo
MDC IDC MSMT BATTERY VOLTAGE: 3.06 V
MDC IDC MSMT LEADCHNL LV IMPEDANCE VALUE: 1007 Ohm
MDC IDC MSMT LEADCHNL LV IMPEDANCE VALUE: 294.194
MDC IDC MSMT LEADCHNL LV IMPEDANCE VALUE: 294.194
MDC IDC MSMT LEADCHNL LV IMPEDANCE VALUE: 570 Ohm
MDC IDC MSMT LEADCHNL LV IMPEDANCE VALUE: 570 Ohm
MDC IDC MSMT LEADCHNL RA IMPEDANCE VALUE: 456 Ohm
MDC IDC MSMT LEADCHNL RA PACING THRESHOLD AMPLITUDE: 0.375 V
MDC IDC MSMT LEADCHNL RA PACING THRESHOLD PULSEWIDTH: 0.4 ms
MDC IDC MSMT LEADCHNL RV PACING THRESHOLD PULSEWIDTH: 0.4 ms
MDC IDC SESS DTM: 20180402041803
MDC IDC SET LEADCHNL RA PACING AMPLITUDE: 2 V
MDC IDC SET LEADCHNL RV SENSING SENSITIVITY: 0.3 mV
MDC IDC STAT BRADY AS VP PERCENT: 95.85 %
MDC IDC STAT BRADY RA PERCENT PACED: 2.68 %

## 2017-01-31 LAB — TISSUE HYBRIDIZATION (BONE MARROW)-NCBH

## 2017-01-31 LAB — CHROMOSOME ANALYSIS, BONE MARROW

## 2017-02-05 ENCOUNTER — Encounter (HOSPITAL_COMMUNITY): Payer: Self-pay

## 2017-02-07 ENCOUNTER — Inpatient Hospital Stay: Payer: BLUE CROSS/BLUE SHIELD | Attending: Oncology | Admitting: Oncology

## 2017-02-07 VITALS — BP 98/59 | HR 68 | Temp 96.4°F | Resp 18 | Wt 172.1 lb

## 2017-02-07 DIAGNOSIS — D638 Anemia in other chronic diseases classified elsewhere: Secondary | ICD-10-CM

## 2017-02-07 DIAGNOSIS — D649 Anemia, unspecified: Secondary | ICD-10-CM

## 2017-02-07 DIAGNOSIS — R5383 Other fatigue: Secondary | ICD-10-CM

## 2017-02-07 DIAGNOSIS — I700 Atherosclerosis of aorta: Secondary | ICD-10-CM

## 2017-02-07 DIAGNOSIS — J449 Chronic obstructive pulmonary disease, unspecified: Secondary | ICD-10-CM | POA: Diagnosis not present

## 2017-02-07 DIAGNOSIS — F17210 Nicotine dependence, cigarettes, uncomplicated: Secondary | ICD-10-CM

## 2017-02-07 DIAGNOSIS — Z79899 Other long term (current) drug therapy: Secondary | ICD-10-CM | POA: Diagnosis not present

## 2017-02-07 DIAGNOSIS — E119 Type 2 diabetes mellitus without complications: Secondary | ICD-10-CM | POA: Diagnosis not present

## 2017-02-07 DIAGNOSIS — I447 Left bundle-branch block, unspecified: Secondary | ICD-10-CM | POA: Diagnosis not present

## 2017-02-07 DIAGNOSIS — I509 Heart failure, unspecified: Secondary | ICD-10-CM

## 2017-02-07 DIAGNOSIS — Z95810 Presence of automatic (implantable) cardiac defibrillator: Secondary | ICD-10-CM | POA: Diagnosis not present

## 2017-02-07 DIAGNOSIS — I420 Dilated cardiomyopathy: Secondary | ICD-10-CM

## 2017-02-07 DIAGNOSIS — D472 Monoclonal gammopathy: Secondary | ICD-10-CM

## 2017-02-07 NOTE — Progress Notes (Signed)
Patient offers no concerns today. 

## 2017-02-08 ENCOUNTER — Other Ambulatory Visit: Payer: Self-pay | Admitting: *Deleted

## 2017-02-08 ENCOUNTER — Encounter: Payer: Self-pay | Admitting: Oncology

## 2017-02-08 DIAGNOSIS — D638 Anemia in other chronic diseases classified elsewhere: Secondary | ICD-10-CM

## 2017-02-08 DIAGNOSIS — D472 Monoclonal gammopathy: Secondary | ICD-10-CM

## 2017-02-08 NOTE — Progress Notes (Signed)
Hematology/Oncology Consult note [facility]  Telephone:([phone] [phone]  Patient Care Team: Delorise Jackson, MD as PCP - General (Internal Medicine)   Name of the patient: Margaret Hendricks  [id number]  20-Mar-1956   Date of visit: 02/08/17  Diagnosis- 1. IgA MGUS 2. Normocytic anemia likely from chronic disease  Chief complaint/ Reason for visit- discuss results of bone marrow biopsy  Heme/Onc history: patient is a 61 year old female with past medical history significant for dilated cardiomyopathy status postAICD placement and patient reports that her most recent EF was 55%. She also has diabetes among other medical problems. Based on her recent CBC from 11/26/2016 patient was noted to have a white count of 6.7, H&H of 10.4/31.9 and platelet count of 276. Ferritin was normal at 128. Hemoglobin A1c was 6.3. MCV was 90.0 patient reports that her ejection fraction was 25% in the past and she used to feel significant fatigue at that time but since the placement of her AICD for energy levels are improving. She otherwise feels well and denies other complaints today  Results of blood work from 12/20/2016 were as follows: CBC showed white count of 6.6, H&H of 10.1/29.6 and a platelet count of 216. Pathology review of smear showed normocytic anemia and no other abnormal findings. CMP was within normal limits. Reticulocyte count was 1.5% which was low for the degree of anemia. B12 and folate were within normal limits. TSH was normal at 1.75. Multiple myeloma panel showed [place] IgA monoclonal protein of 0.3 g with kappa light chain specificity. Iron studies were within normal limits. ESR was elevated at 63.   Bone marrow biopsy showed 9% clonal plasma cells. Cytogenetics normal  Skeletal survey- Subcentimeter lucencies in the frontal and anterior parietal wall and in the right femoral neck may reflect myelomatous involvement. Mild multilevel degenerative  disc disease of the spine. Thoracic aortic atherosclerosis.  Interval history- doing well. Able to carry out her ADL's without difficulty  ECOG PS- 1 Pain scale- 0   Review of systems- Review of Systems  Constitutional: Negative for chills, fever, malaise/fatigue and weight loss.  HENT: Negative for congestion, ear discharge and nosebleeds.   Eyes: Negative for blurred vision.  Respiratory: Negative for cough, hemoptysis, sputum production, shortness of breath and wheezing.   Cardiovascular: Negative for chest pain, palpitations, orthopnea and claudication.  Gastrointestinal: Negative for abdominal pain, blood in stool, constipation, diarrhea, heartburn, melena, nausea and vomiting.  Genitourinary: Negative for dysuria, flank pain, frequency, hematuria and urgency.  Musculoskeletal: Negative for back pain, joint pain and myalgias.  Skin: Negative for rash.  Neurological: Negative for dizziness, tingling, focal weakness, seizures, weakness and headaches.  Endo/Heme/Allergies: Does not bruise/bleed easily.  Psychiatric/Behavioral: Negative for depression and suicidal ideas. The patient does not have insomnia.      Current treatment- observation  No Known Allergies   Past Medical History:  Diagnosis Date  . Abnormal heart rhythm   . AICD (automatic cardioverter/defibrillator) present   . Anemia   . Chronic systolic CHF (congestive heart failure), NYHA class 2- 3 ([place]) 07/03/2016  . COPD (chronic obstructive pulmonary disease) ([place])   . Diabetes mellitus without complication ([place])   . LBBB (left bundle branch block) 07/03/2016  . NICM (nonischemic cardiomyopathy) ([place]) 07/03/2016  . Shortness of breath      Past Surgical History:  Procedure Laterality Date  . APPENDECTOMY    . CARDIAC CATHETERIZATION Left 03/14/2015   Procedure: Left Heart Cath;  Surgeon: Dionisio David,  MD;  Location: [place] [facility];  Service: Cardiovascular;  Laterality: Left;  . EP IMPLANTABLE DEVICE N/A  07/16/2016   Procedure: BiV ICD Insertion CRT-D;  Surgeon: Deboraha Sprang, MD;  Location: [place] [facility];  Service: Cardiovascular;  Laterality: N/A;  . ICD IMPLANT    . TUBAL LIGATION Bilateral     Social History   Social History  . Marital status: Single    Spouse name: N/A  . Number of children: N/A  . Years of education: N/A   Occupational History  . Not on file.   Social History Main Topics  . Smoking status: Current Every Day Smoker    Years: 30.00    Types: Cigarettes    Last attempt to quit: 01/28/2015  . Smokeless tobacco: Never Used     Comment: Nomie admits she smokes 1/2 cigarette in am on the way to work and 1/2 cigarette on the way home from work.   . Alcohol use No  . Drug use: Yes    Types: Fentanyl  . Sexual activity: Not on file   Other Topics Concern  . Not on file   Social History Narrative  . No narrative on file    Family History  Problem Relation Age of Onset  . Hypertension    . Heart attack       Current Outpatient Prescriptions:  .  albuterol (PROVENTIL HFA;VENTOLIN HFA) 108 (90 BASE) MCG/ACT inhaler, Inhale 2 puffs into the lungs every 6 (six) hours as needed for wheezing., Disp: , Rfl:  .  carvedilol (COREG) 25 MG tablet, Take 25 mg by mouth 2 (two) times daily with a meal., Disp: , Rfl:  .  furosemide (LASIX) 20 MG tablet, Take 20 mg by mouth daily., Disp: , Rfl:  .  ibuprofen (ADVIL,MOTRIN) 200 MG tablet, Take 600 mg by mouth every 6 (six) hours as needed for mild pain., Disp: , Rfl:  .  Menthol, Topical Analgesic, (BENGAY EX), Apply 1 application topically daily as needed. Back pain, Disp: , Rfl:  .  Multiple Vitamin (MULTIVITAMIN WITH MINERALS) TABS tablet, Take 1 tablet by mouth daily., Disp: , Rfl:  .  Multiple Vitamins-Minerals (ICAPS AREDS FORMULA PO), Take 1 tablet by mouth daily., Disp: , Rfl:  .  sacubitril-valsartan (ENTRESTO) 97-103 MG, Take 1 tablet by mouth 2 (two) times daily., Disp: , Rfl:  .  spironolactone  (ALDACTONE) 25 MG tablet, Take 25 mg by mouth daily., Disp: , Rfl:   Physical exam:  Vitals:   02/07/17 1131  BP: (!) 98/59  Pulse: 68  Resp: 18  Temp: (!) 96.4 F (35.8 C)  TempSrc: Tympanic  Weight: 172 lb 2 oz (78.1 kg)   Physical Exam  Constitutional: She is oriented to person, place, and time and well-developed, well-nourished, and in no distress.  HENT:  Head: Normocephalic and atraumatic.  Eyes: EOM are normal. Pupils are equal, round, and reactive to light.  Neck: Normal range of motion.  Cardiovascular: Normal rate, regular rhythm and normal heart sounds.   Pulmonary/Chest: Effort normal and breath sounds normal.  Abdominal: Soft. Bowel sounds are normal.  Neurological: She is alert and oriented to person, place, and time.  Skin: Skin is warm and dry.     CMP Latest Ref Rng & Units 12/20/2016  Glucose 65 - 99 mg/dL 113(H)  BUN 6 - 20 mg/dL 19  Creatinine 0.44 - 1.00 mg/dL 0.77  Sodium 135 - 145 mmol/L 136  Potassium 3.5 - 5.1 mmol/L  4.3  Chloride 101 - 111 mmol/L 103  CO2 22 - 32 mmol/L 27  Calcium 8.9 - 10.3 mg/dL 9.0  Total Protein 6.5 - 8.1 g/dL 7.4  Total Bilirubin 0.3 - 1.2 mg/dL 0.5  Alkaline Phos 38 - 126 U/L 46  AST 15 - 41 U/L 23  ALT 14 - 54 U/L 17   CBC Latest Ref Rng & Units 01/22/2017  WBC 3.6 - 11.0 K/uL 6.6  Hemoglobin 12.0 - 16.0 g/dL 10.5(L)  Hematocrit 35.0 - 47.0 % 31.1(L)  Platelets 150 - 440 K/uL 246    No images are attached to the encounter.  Ct Bone Marrow Biopsy & Aspiration  Result Date: 01/22/2017 INDICATION: Monoclonal protein disease. EXAM: CT BONE MARROW BIOPSY AND ASPIRATION MEDICATIONS: None. ANESTHESIA/SEDATION: Fentanyl 100 mcg IV; Versed 1.0 mg IV Moderate Sedation Time:  15 minutes. The patient was continuously monitored during the procedure by the interventional radiology nurse under my direct supervision. COMPLICATIONS: None immediate. PROCEDURE: Informed written consent was obtained from [place], [place] after a  discussion of the risks, benefits and alternatives to treatment. A timeout was performed prior to the initiation of the procedure. The patient was placed prone on CT scanner. Posterior flank region was prepped and draped using sterile technique. Local anesthetic was applied. Under CT guidance, 22 gauge spinal needle is directed to the the posterior margin of the right iliac bone. Lidocaine was injected subperiosteally. Needle was removed. Under CT guidance, trocar was directed through the posterior margin of the right iliac bone. Bone marrow aspiration was performed with and without heparin. This was followed by core biopsy. The samples were delivered to pathology. Needle was removed and appropriate dressing was applied. No immediate complications were noted. IMPRESSION: Successful CT guided bone marrow aspiration and core biopsy of right iliac bone. Electronically Signed   By: Marijo Conception, M.D.   On: 01/22/2017 10:15     Assessment and plan- Patient is a 61 y.o. female who sees me for following issues:  1. Normocytic anemia- results of work up consistent with anemia of chronic disease. I do not think that her MGUS is contributing to her anemia. BM biopsy also did not reveal any other cause such as MDS. Continue to monitor  2. IgA MGUS- she has 9% plasma cells in her bone marrow and very small monoclonal spike of 0.7 gm in her serum. No evidence of end organ damage. It is unclear what the etiology of subcm lucencies seen on skeletal survery are. I will hold off on PET or MRI at this point. I will consider it should her anemia become worse or renal functions decline to see if she is progressing to over myeloma. I will see her back in 3 months with cbc, cmp, myeloma panel and free light chains   Visit Diagnosis 1. Normocytic anemia   2. Anemia of chronic disease   3. MGUS (monoclonal gammopathy of unknown significance)      Dr. Randa Evens, MD, MPH [facility] Pager-  [phone] 02/08/2017 8:09 AM

## 2017-03-01 ENCOUNTER — Ambulatory Visit (INDEPENDENT_AMBULATORY_CARE_PROVIDER_SITE_OTHER): Payer: BLUE CROSS/BLUE SHIELD

## 2017-03-01 ENCOUNTER — Telehealth: Payer: Self-pay

## 2017-03-01 DIAGNOSIS — I5022 Chronic systolic (congestive) heart failure: Secondary | ICD-10-CM | POA: Diagnosis not present

## 2017-03-01 DIAGNOSIS — Z95810 Presence of automatic (implantable) cardiac defibrillator: Secondary | ICD-10-CM | POA: Diagnosis not present

## 2017-03-01 NOTE — Telephone Encounter (Signed)
Remote ICM transmission received.  Attempted patient call and left detailed message regarding transmission and next ICM scheduled for 04/01/2017.  Advised to return call for any fluid symptoms or questions.    

## 2017-03-01 NOTE — Progress Notes (Signed)
EPIC Encounter for ICM Monitoring  Patient Name: Margaret Hendricks is a 61 y.o. female Date: 03/01/2017 Primary Care Physican: Nino Glow McLean-Scocuzza, MD Primary Cardiologist: Caryl Comes Electrophysiologist: Caryl Comes Dry Weight:Last ICM weight 167 lbs Bi-V Pacing: 98.4%       Attempted call to patient and unable to reach.  Left detailed message regarding transmission.  Transmission reviewed.    Thoracic impedance normal.  Prescribed dosage: Furosemide 20 mg 1 tablet daily  Labs: 12/20/2016 Creatinine 0.77, BUN 19, Potassium 4.3, Sodium 136 07/03/2016 Creatinine 0.80, BUN 15, Potassium 4.4, Sodium 140  Recommendations: Left voice mail with ICM number and encouraged to call for fluid symptoms.  Follow-up plan: ICM clinic phone appointment on 04/01/2017.    Copy of ICM check sent to device physician.   3 month ICM trend: 03/01/2017   1 Year ICM trend:      [name], RN 03/01/2017 9:02 AM

## 2017-03-14 ENCOUNTER — Encounter: Payer: Self-pay | Admitting: Emergency Medicine

## 2017-03-14 DIAGNOSIS — E119 Type 2 diabetes mellitus without complications: Secondary | ICD-10-CM | POA: Insufficient documentation

## 2017-03-14 DIAGNOSIS — Y999 Unspecified external cause status: Secondary | ICD-10-CM | POA: Insufficient documentation

## 2017-03-14 DIAGNOSIS — M5442 Lumbago with sciatica, left side: Secondary | ICD-10-CM | POA: Insufficient documentation

## 2017-03-14 DIAGNOSIS — X509XXA Other and unspecified overexertion or strenuous movements or postures, initial encounter: Secondary | ICD-10-CM | POA: Diagnosis not present

## 2017-03-14 DIAGNOSIS — Z79899 Other long term (current) drug therapy: Secondary | ICD-10-CM | POA: Diagnosis not present

## 2017-03-14 DIAGNOSIS — Y929 Unspecified place or not applicable: Secondary | ICD-10-CM | POA: Insufficient documentation

## 2017-03-14 DIAGNOSIS — I5022 Chronic systolic (congestive) heart failure: Secondary | ICD-10-CM | POA: Diagnosis not present

## 2017-03-14 DIAGNOSIS — G8929 Other chronic pain: Secondary | ICD-10-CM | POA: Insufficient documentation

## 2017-03-14 DIAGNOSIS — S39012A Strain of muscle, fascia and tendon of lower back, initial encounter: Secondary | ICD-10-CM | POA: Diagnosis not present

## 2017-03-14 DIAGNOSIS — F17210 Nicotine dependence, cigarettes, uncomplicated: Secondary | ICD-10-CM | POA: Diagnosis not present

## 2017-03-14 DIAGNOSIS — Y9301 Activity, walking, marching and hiking: Secondary | ICD-10-CM | POA: Diagnosis not present

## 2017-03-14 DIAGNOSIS — J449 Chronic obstructive pulmonary disease, unspecified: Secondary | ICD-10-CM | POA: Insufficient documentation

## 2017-03-14 DIAGNOSIS — M545 Low back pain: Secondary | ICD-10-CM | POA: Diagnosis present

## 2017-03-14 NOTE — ED Triage Notes (Signed)
Pt to triage via [place], came to ED via EMS from home.  PT reports lower back pain.  Family states hx of back problems.  Pt reports she has been gardening and mowing the lawn.  Pt reports prior to EMS arrival, pain so bad she was crying, pt give 30mg  toradol IM by EMS w/ some improvement.  Pt reports numbness/tingling in left leg.  Pt in NAD at this time.

## 2017-03-15 ENCOUNTER — Emergency Department
Admission: EM | Admit: 2017-03-15 | Discharge: 2017-03-15 | Disposition: A | Discharge status: Home / Self Care | Payer: BLUE CROSS/BLUE SHIELD | Attending: Emergency Medicine | Admitting: Emergency Medicine

## 2017-03-15 ENCOUNTER — Emergency Department: Payer: BLUE CROSS/BLUE SHIELD

## 2017-03-15 DIAGNOSIS — S39012A Strain of muscle, fascia and tendon of lower back, initial encounter: Secondary | ICD-10-CM

## 2017-03-15 DIAGNOSIS — G8929 Other chronic pain: Secondary | ICD-10-CM

## 2017-03-15 DIAGNOSIS — M5442 Lumbago with sciatica, left side: Secondary | ICD-10-CM

## 2017-03-15 MED ORDER — DIAZEPAM 5 MG PO TABS: 5.0000 mg | ORAL_TABLET | Freq: Three times a day (TID) | ORAL | 0 refills | Status: AC | PRN

## 2017-03-15 MED ORDER — LIDOCAINE 5 % EX PTCH: 1.0000 | MEDICATED_PATCH | Freq: Two times a day (BID) | CUTANEOUS | 0 refills | Status: AC

## 2017-03-15 MED ORDER — TRAMADOL HCL 50 MG PO TABS: 50.0000 mg | ORAL_TABLET | Freq: Four times a day (QID) | ORAL | 0 refills | Status: DC | PRN

## 2017-03-15 MED ORDER — DIAZEPAM 5 MG PO TABS
5.0000 mg | ORAL_TABLET | Freq: Once | ORAL | Status: AC
  Administered 2017-03-15: 5 mg via ORAL
  Filled 2017-03-15: qty 1

## 2017-03-15 MED ORDER — LIDOCAINE 5 % EX PTCH
1.0000 | MEDICATED_PATCH | CUTANEOUS | Status: DC
  Administered 2017-03-15: 1 via TRANSDERMAL
  Filled 2017-03-15: qty 1

## 2017-03-15 MED ORDER — MORPHINE SULFATE (PF) 4 MG/ML IV SOLN
4.0000 mg | Freq: Once | INTRAVENOUS | Status: AC
  Administered 2017-03-15: 4 mg via INTRAMUSCULAR
  Filled 2017-03-15: qty 1

## 2017-03-15 NOTE — ED Provider Notes (Signed)
[facility] Emergency Department Provider Note   ____________________________________________   First MD Initiated Contact with Patient 03/15/17 [phone]     (approximate)  I have reviewed the triage vital signs and the nursing notes.   HISTORY  Chief Complaint Back Pain    HPI Margaret Hendricks is a 61 y.o. female who comes into the hospital today with some constant back pain. She reports that she's been having spasms and the pain is moving into her legs. Her hips feel like they're on fire. The patient states that her back started hurting like this today but she's had a backache since Sunday. She's had problems with her back for years. She said on her feet and worked all day today and that likely triggered this severe back pain. She has seen a chiropractor in the past and has been treated with Flexeril by her primary care doctor in the past. She had an x-ray about 3 months ago. The patient rates her pain a 10 out of 10 in intensity. At home she took a Percocet and Flexeril but it did not help. It eased the pain for a little bit and then the pain returned. The patient was given Toradol by EMS and the medicine helped significantly. The patient reports she's not had any bladder or bowel incontinence. She has no genital numbness. The patient reports that she does have some mild left leg numbness. She is here today for evaluation.   Past Medical History:  Diagnosis Date  . Abnormal heart rhythm   . AICD (automatic cardioverter/defibrillator) present   . Anemia   . Chronic systolic CHF (congestive heart failure), NYHA class 2- 3 ([place]) 07/03/2016  . COPD (chronic obstructive pulmonary disease) ([place])   . Diabetes mellitus without complication ([place])   . LBBB (left bundle branch block) 07/03/2016  . NICM (nonischemic cardiomyopathy) ([place]) 07/03/2016  . Shortness of breath     Patient Active Problem List   Diagnosis Date Noted  . LBBB (left bundle branch block) 07/03/2016    . NICM (nonischemic cardiomyopathy) ([place]) 07/03/2016  . Chronic systolic CHF (congestive heart failure), NYHA class 2- 3 ([place]) 07/03/2016  . COPD, moderate ([place]) 03/12/2015  . Tobacco abuse 01/04/2015    Past Surgical History:  Procedure Laterality Date  . APPENDECTOMY    . CARDIAC CATHETERIZATION Left 03/14/2015   Procedure: Left Heart Cath;  Surgeon: Dionisio David, MD;  Location: [place] [facility];  Service: Cardiovascular;  Laterality: Left;  . EP IMPLANTABLE DEVICE N/A 07/16/2016   Procedure: BiV ICD Insertion CRT-D;  Surgeon: Deboraha Sprang, MD;  Location: [place] [facility];  Service: Cardiovascular;  Laterality: N/A;  . ICD IMPLANT    . TUBAL LIGATION Bilateral     Prior to Admission medications   Medication Sig Start Date End Date Taking? Authorizing Provider  albuterol (PROVENTIL HFA;VENTOLIN HFA) 108 (90 BASE) MCG/ACT inhaler Inhale 2 puffs into the lungs every 6 (six) hours as needed for wheezing.    [provider]  carvedilol (COREG) 25 MG tablet Take 25 mg by mouth 2 (two) times daily with a meal.    [provider]  diazepam (VALIUM) 5 MG tablet Take 1 tablet (5 mg total) by mouth every 8 (eight) hours as needed for anxiety. 03/15/17 03/15/18  Loney Hering, MD  furosemide (LASIX) 20 MG tablet Take 20 mg by mouth daily.    [provider]  ibuprofen (ADVIL,MOTRIN) 200 MG tablet Take 600 mg by mouth every  6 (six) hours as needed for mild pain.    [provider]  lidocaine (LIDODERM) 5 % Place 1 patch onto the skin every 12 (twelve) hours. Remove & Discard patch within 12 hours or as directed by MD 03/15/17 03/15/18  Loney Hering, MD  Menthol, Topical Analgesic, (BENGAY EX) Apply 1 application topically daily as needed. Back pain    [provider]  Multiple Vitamin (MULTIVITAMIN WITH MINERALS) TABS tablet Take 1 tablet by mouth daily.    [provider]  Multiple Vitamins-Minerals (ICAPS AREDS FORMULA PO) Take  1 tablet by mouth daily.    [provider]  sacubitril-valsartan (ENTRESTO) 97-103 MG Take 1 tablet by mouth 2 (two) times daily.    [provider]  spironolactone (ALDACTONE) 25 MG tablet Take 25 mg by mouth daily.    [provider]  traMADol (ULTRAM) 50 MG tablet Take 1 tablet (50 mg total) by mouth every 6 (six) hours as needed. 03/15/17   Loney Hering, MD    Allergies Patient has no known allergies.  Family History  Problem Relation Age of Onset  . Hypertension Unknown   . Heart attack Unknown     Social History Social History  Substance Use Topics  . Smoking status: Current Every Day Smoker    Years: 30.00    Types: Cigarettes    Last attempt to quit: 01/28/2015  . Smokeless tobacco: Never Used     Comment: Samyria admits she smokes 1/2 cigarette in am on the way to work and 1/2 cigarette on the way home from work.   . Alcohol use No    Review of Systems  Constitutional: No fever/chills Eyes: No visual changes. ENT: No sore throat. Cardiovascular: Denies chest pain. Respiratory: Denies shortness of breath. Gastrointestinal: No abdominal pain.  No nausea, no vomiting.  No diarrhea.  No constipation. Genitourinary: Negative for dysuria. Musculoskeletal:  back pain. Skin: Negative for rash. Neurological: Left leg numbness   ____________________________________________   PHYSICAL EXAM:  VITAL SIGNS: ED Triage Vitals [03/14/17 2220]  Enc Vitals Group     BP 134/71     Pulse Rate 65     Resp 18     Temp 98.4 F (36.9 C)     Temp Source Oral     SpO2 97 %     Weight 164 lb (74.4 kg)     Height 5\' 3"  (1.6 m)     Head Circumference      Peak Flow      Pain Score 8     Pain Loc      Pain Edu?      Excl. in [place]?     Constitutional: Alert and oriented. Well appearing and in Moderate distress. Eyes: Conjunctivae are normal. PERRL. EOMI. Head: Atraumatic. Nose: No congestion/rhinnorhea. Mouth/Throat: Mucous membranes are  moist.  Oropharynx non-erythematous. Cardiovascular: Normal rate, regular rhythm. Grossly normal heart sounds.  Good peripheral circulation. Respiratory: Normal respiratory effort.  No retractions. Lungs CTAB. Gastrointestinal: Soft and nontender. No distention. Positive bowel sounds Musculoskeletal: No lower extremity tenderness nor edema.   Neurologic:  Normal speech and language.  Skin:  Skin is warm, dry and intact. [name] Psychiatric: Mood and affect are normal.   ____________________________________________   LABS (all labs ordered are listed, but only abnormal results are displayed)  Labs Reviewed - No data to display ____________________________________________  EKG  none ____________________________________________  RADIOLOGY  DG lumbar spine ____________________________________________   PROCEDURES  Procedure(s) performed: None  Procedures  Critical Care performed: No  ____________________________________________   INITIAL IMPRESSION / ASSESSMENT AND PLAN / ED COURSE  Pertinent labs & imaging results that were available during my care of the patient were reviewed by me and considered in my medical decision making (see chart for details).  This is a 61 year old female who comes into the hospital today with some back pain. The patient has been having some chronic back pain. I gave the patient a Lidoderm patch with some Valium as well as a shot of morphine. Her pain is improved. The patient's lumbar spine x-ray shows degenerative disc disease and facet arthropathy similar to prior studies. The patient will be discharged to follow-up with orthopedic surgery.  Clinical Course as of Mar 15 504  Fri Mar 15, 2017  0400 Degenerative disc disease and facet arthropathy, similar to prior radiographic survey. No evidence of acute abnormality.   DG Lumbar Spine 2-3 Views [AW]    Clinical Course User Index [AW] Loney Hering, MD      ____________________________________________   FINAL CLINICAL IMPRESSION(S) / ED DIAGNOSES  Final diagnoses:  Chronic midline low back pain with left-sided sciatica  Strain of lumbar region, initial encounter      NEW MEDICATIONS STARTED DURING THIS VISIT:  New Prescriptions   DIAZEPAM (VALIUM) 5 MG TABLET    Take 1 tablet (5 mg total) by mouth every 8 (eight) hours as needed for anxiety.   LIDOCAINE (LIDODERM) 5 %    Place 1 patch onto the skin every 12 (twelve) hours. Remove & Discard patch within 12 hours or as directed by MD   TRAMADOL (ULTRAM) 50 MG TABLET    Take 1 tablet (50 mg total) by mouth every 6 (six) hours as needed.     Note:  This document was prepared using Dragon voice recognition software and may include unintentional dictation errors.    Loney Hering, MD 03/15/17 [phone]

## 2017-03-15 NOTE — Discharge Instructions (Signed)
Please follow up with orthopedic surgery. °

## 2017-03-20 ENCOUNTER — Other Ambulatory Visit: Payer: Self-pay | Admitting: Internal Medicine

## 2017-03-20 DIAGNOSIS — R945 Abnormal results of liver function studies: Secondary | ICD-10-CM

## 2017-03-27 ENCOUNTER — Ambulatory Visit
Admission: RE | Admit: 2017-03-27 | Discharge: 2017-03-27 | Disposition: A | Discharge status: Home / Self Care | Payer: BLUE CROSS/BLUE SHIELD | Source: Ambulatory Visit | Attending: Internal Medicine | Admitting: Internal Medicine

## 2017-03-27 DIAGNOSIS — R945 Abnormal results of liver function studies: Secondary | ICD-10-CM | POA: Diagnosis present

## 2017-04-01 ENCOUNTER — Ambulatory Visit (INDEPENDENT_AMBULATORY_CARE_PROVIDER_SITE_OTHER): Payer: BLUE CROSS/BLUE SHIELD

## 2017-04-01 ENCOUNTER — Telehealth: Payer: Self-pay | Admitting: Cardiology

## 2017-04-01 DIAGNOSIS — Z95810 Presence of automatic (implantable) cardiac defibrillator: Secondary | ICD-10-CM

## 2017-04-01 DIAGNOSIS — I5022 Chronic systolic (congestive) heart failure: Secondary | ICD-10-CM

## 2017-04-01 NOTE — Telephone Encounter (Signed)
LMOVM reminding pt to send remote transmission.   

## 2017-04-04 NOTE — Progress Notes (Signed)
EPIC Encounter for ICM Monitoring  Patient Name: Margaret Hendricks is a 61 y.o. female Date: 04/04/2017 Primary Care Physican: McLean-Scocuzza, Nino Glow, MD Primary Cardiologist: Caryl Comes Electrophysiologist: Caryl Comes Dry Weight:Last ICM weight 167 lbs Bi-V Pacing: 98.4%                                                  Attempted call to patient and unable to reach.  Left message to return call.  Transmission reviewed.    Thoracic impedance abnormal suggesting fluid accumulation but is trending back toward baseline.  Prescribed dosage: Furosemide 20 mg 1 tablet daily  Labs: 12/20/2016 Creatinine 0.77, BUN 19, Potassium 4.3, Sodium 136 07/03/2016 Creatinine 0.80, BUN 15, Potassium 4.4, Sodium 140  Recommendations: NONE - Unable to reach patient   Follow-up plan: ICM clinic phone appointment on 05/07/2017.    Copy of ICM check sent to device physician.   3 month ICM trend: 04/04/2017   1 Year ICM trend:      [name], RN 04/04/2017 5:14 PM

## 2017-05-07 ENCOUNTER — Ambulatory Visit (INDEPENDENT_AMBULATORY_CARE_PROVIDER_SITE_OTHER): Payer: BLUE CROSS/BLUE SHIELD | Admitting: *Deleted

## 2017-05-07 ENCOUNTER — Telehealth: Payer: Self-pay

## 2017-05-07 ENCOUNTER — Inpatient Hospital Stay: Payer: BLUE CROSS/BLUE SHIELD | Admitting: Oncology

## 2017-05-07 ENCOUNTER — Inpatient Hospital Stay: Payer: BLUE CROSS/BLUE SHIELD

## 2017-05-07 DIAGNOSIS — I5022 Chronic systolic (congestive) heart failure: Secondary | ICD-10-CM | POA: Diagnosis not present

## 2017-05-07 DIAGNOSIS — I428 Other cardiomyopathies: Secondary | ICD-10-CM | POA: Diagnosis not present

## 2017-05-07 DIAGNOSIS — Z95810 Presence of automatic (implantable) cardiac defibrillator: Secondary | ICD-10-CM

## 2017-05-07 NOTE — Telephone Encounter (Signed)
Remote ICM transmission received.  Attempted patient call and left detailed message regarding transmission and next ICM scheduled for 06/07/2017.  Advised to return call for any fluid symptoms or questions.    

## 2017-05-07 NOTE — Progress Notes (Signed)
EPIC Encounter for ICM Monitoring  Patient Name: Margaret Hendricks is a 61 y.o. female Date: 05/07/2017 Primary Care Physican: McLean-Scocuzza, Nino Glow, MD Primary Cardiologist: Caryl Comes Electrophysiologist: Caryl Comes Dry Weight:Last ICM weight 167 lbs Bi-V Pacing: 98.3%      Attempted call to patient and unable to reach.  Left detailed message regarding transmission.  Transmission reviewed.    Thoracic impedance normal.  Prescribed dosage: Furosemide 20 mg 1 tablet daily  Labs: 12/20/2016 Creatinine 0.77, BUN 19, Potassium 4.3, Sodium 136 07/03/2016 Creatinine 0.80, BUN 15, Potassium 4.4, Sodium 140  Recommendations: Left voice mail with ICM number and encouraged to call for fluid symptoms.  Follow-up plan: ICM clinic phone appointment on 06/07/2017.   Copy of ICM check sent to device physician.   3 month ICM trend: 05/07/2017   1 Year ICM trend:      [name], RN 05/07/2017 9:03 AM

## 2017-05-13 ENCOUNTER — Encounter: Payer: Self-pay | Admitting: Cardiology

## 2017-05-20 LAB — CUP PACEART REMOTE DEVICE CHECK
Battery Remaining Longevity: 107 mo
Battery Voltage: 3.03 V
Brady Statistic AP VP Percent: 3.92 %
Brady Statistic AS VP Percent: 94.47 %
Brady Statistic AS VS Percent: 1.47 %
Brady Statistic RA Percent Paced: 4.03 %
HIGH POWER IMPEDANCE MEASURED VALUE: 69 Ohm
Implantable Lead Implant Date: 20170918
Implantable Lead Implant Date: 20170918
Implantable Lead Implant Date: 20170918
Implantable Lead Location: 753858
Implantable Lead Location: 753859
Implantable Lead Location: 753860
Implantable Lead Model: 5076
Lead Channel Impedance Value: 1007 Ohm
Lead Channel Impedance Value: 1045 Ohm
Lead Channel Impedance Value: 275.5 Ohm
Lead Channel Impedance Value: 280.169
Lead Channel Impedance Value: 380 Ohm
Lead Channel Impedance Value: 551 Ohm
Lead Channel Impedance Value: 551 Ohm
Lead Channel Pacing Threshold Amplitude: 0.625 V
Lead Channel Pacing Threshold Amplitude: 1.25 V
Lead Channel Sensing Intrinsic Amplitude: 12.5 mV
Lead Channel Sensing Intrinsic Amplitude: 3 mV
Lead Channel Setting Pacing Amplitude: 2 V
Lead Channel Setting Pacing Amplitude: 2 V
Lead Channel Setting Pacing Amplitude: 2.5 V
MDC IDC MSMT LEADCHNL LV IMPEDANCE VALUE: 280.169
MDC IDC MSMT LEADCHNL LV IMPEDANCE VALUE: 289.049
MDC IDC MSMT LEADCHNL LV IMPEDANCE VALUE: 294.194
MDC IDC MSMT LEADCHNL LV IMPEDANCE VALUE: 570 Ohm
MDC IDC MSMT LEADCHNL LV IMPEDANCE VALUE: 608 Ohm
MDC IDC MSMT LEADCHNL LV IMPEDANCE VALUE: 931 Ohm
MDC IDC MSMT LEADCHNL LV IMPEDANCE VALUE: 931 Ohm
MDC IDC MSMT LEADCHNL LV IMPEDANCE VALUE: 950 Ohm
MDC IDC MSMT LEADCHNL LV IMPEDANCE VALUE: 950 Ohm
MDC IDC MSMT LEADCHNL LV PACING THRESHOLD PULSEWIDTH: 0.8 ms
MDC IDC MSMT LEADCHNL RA IMPEDANCE VALUE: 437 Ohm
MDC IDC MSMT LEADCHNL RA PACING THRESHOLD AMPLITUDE: 0.375 V
MDC IDC MSMT LEADCHNL RA PACING THRESHOLD PULSEWIDTH: 0.4 ms
MDC IDC MSMT LEADCHNL RA SENSING INTR AMPL: 3 mV
MDC IDC MSMT LEADCHNL RV IMPEDANCE VALUE: 494 Ohm
MDC IDC MSMT LEADCHNL RV PACING THRESHOLD PULSEWIDTH: 0.4 ms
MDC IDC MSMT LEADCHNL RV SENSING INTR AMPL: 12.5 mV
MDC IDC PG IMPLANT DT: 20170918
MDC IDC SESS DTM: 20180710083723
MDC IDC SET LEADCHNL LV PACING PULSEWIDTH: 0.8 ms
MDC IDC SET LEADCHNL RV PACING PULSEWIDTH: 0.4 ms
MDC IDC SET LEADCHNL RV SENSING SENSITIVITY: 0.3 mV
MDC IDC STAT BRADY AP VS PERCENT: 0.14 %
MDC IDC STAT BRADY RV PERCENT PACED: 2.89 %

## 2017-05-21 ENCOUNTER — Encounter: Payer: Self-pay | Admitting: Oncology

## 2017-05-21 ENCOUNTER — Inpatient Hospital Stay: Payer: BLUE CROSS/BLUE SHIELD | Attending: Oncology | Admitting: Oncology

## 2017-05-21 ENCOUNTER — Inpatient Hospital Stay: Payer: BLUE CROSS/BLUE SHIELD

## 2017-05-21 VITALS — BP 131/79 | HR 66 | Temp 98.0°F | Ht 63.0 in | Wt 173.6 lb

## 2017-05-21 DIAGNOSIS — I700 Atherosclerosis of aorta: Secondary | ICD-10-CM | POA: Diagnosis not present

## 2017-05-21 DIAGNOSIS — I420 Dilated cardiomyopathy: Secondary | ICD-10-CM | POA: Insufficient documentation

## 2017-05-21 DIAGNOSIS — F419 Anxiety disorder, unspecified: Secondary | ICD-10-CM | POA: Insufficient documentation

## 2017-05-21 DIAGNOSIS — D638 Anemia in other chronic diseases classified elsewhere: Secondary | ICD-10-CM | POA: Diagnosis not present

## 2017-05-21 DIAGNOSIS — F17210 Nicotine dependence, cigarettes, uncomplicated: Secondary | ICD-10-CM | POA: Insufficient documentation

## 2017-05-21 DIAGNOSIS — Z95810 Presence of automatic (implantable) cardiac defibrillator: Secondary | ICD-10-CM | POA: Diagnosis not present

## 2017-05-21 DIAGNOSIS — Z79899 Other long term (current) drug therapy: Secondary | ICD-10-CM | POA: Insufficient documentation

## 2017-05-21 DIAGNOSIS — D472 Monoclonal gammopathy: Secondary | ICD-10-CM | POA: Insufficient documentation

## 2017-05-21 DIAGNOSIS — I447 Left bundle-branch block, unspecified: Secondary | ICD-10-CM | POA: Diagnosis not present

## 2017-05-21 DIAGNOSIS — I5022 Chronic systolic (congestive) heart failure: Secondary | ICD-10-CM | POA: Insufficient documentation

## 2017-05-21 DIAGNOSIS — J449 Chronic obstructive pulmonary disease, unspecified: Secondary | ICD-10-CM | POA: Insufficient documentation

## 2017-05-21 DIAGNOSIS — E119 Type 2 diabetes mellitus without complications: Secondary | ICD-10-CM | POA: Insufficient documentation

## 2017-05-21 LAB — COMPREHENSIVE METABOLIC PANEL WITH GFR
ALT: 19 U/L (ref 14–54)
AST: 28 U/L (ref 15–41)
Albumin: 4 g/dL (ref 3.5–5.0)
Alkaline Phosphatase: 53 U/L (ref 38–126)
Anion gap: 7 (ref 5–15)
BUN: 17 mg/dL (ref 6–20)
CO2: 25 mmol/L (ref 22–32)
Calcium: 8.9 mg/dL (ref 8.9–10.3)
Chloride: 103 mmol/L (ref 101–111)
Creatinine, Ser: 0.8 mg/dL (ref 0.44–1.00)
GFR calc Af Amer: >60 mL/min
GFR calc non Af Amer: >60 mL/min
Glucose, Bld: 159 mg/dL — ABNORMAL HIGH (ref 65–99)
Potassium: 4 mmol/L (ref 3.5–5.1)
Sodium: 135 mmol/L (ref 135–145)
Total Bilirubin: 0.5 mg/dL (ref 0.3–1.2)
Total Protein: 6.9 g/dL (ref 6.5–8.1)

## 2017-05-21 LAB — CBC
HCT: 30.5 % — ABNORMAL LOW (ref 35.0–47.0)
Hemoglobin: 10.6 g/dL — ABNORMAL LOW (ref 12.0–16.0)
MCH: 30.9 pg (ref 26.0–34.0)
MCHC: 34.7 g/dL (ref 32.0–36.0)
MCV: 89.3 fL (ref 80.0–100.0)
Platelets: 260 K/uL (ref 150–440)
RBC: 3.42 MIL/uL — ABNORMAL LOW (ref 3.80–5.20)
RDW: 14.9 % — ABNORMAL HIGH (ref 11.5–14.5)
WBC: 5.8 K/uL (ref 3.6–11.0)

## 2017-05-21 NOTE — Progress Notes (Signed)
Hematology/Oncology Consult note [facility]  [phone] [phone]  Patient Care Team: McLean-Scocuzza, Nino Glow, MD as PCP - General (Internal Medicine)   Name of the patient: Margaret Hendricks  [id number]  03-14-1956   Date of visit: 05/21/17  Diagnosis- 1. IgA MGUS 2. Normocytic anemia likely from chronic disease  Chief complaint/ Reason for visit- routine f/u  Heme/Onc history: patient is a 61 year old female with past medical history significant for dilated cardiomyopathy status postAICD placement and patient reports that her most recent EF was 55%. She also has diabetes among other medical problems. Based on her recent CBC from 11/26/2016 patient was noted to have a white count of 6.7, H&H of 10.4/31.9 and platelet count of 276. Ferritin was normal at 128. Hemoglobin A1c was 6.3. MCV was 90.0 patient reports that her ejection fraction was 25% in the past and she used to feel significant fatigue at that time but since the placement of her AICD for energy levels are improving. She otherwise feels well and denies other complaints today  Results of blood work from 12/20/2016 were as follows: CBC showed white count of 6.6, H&H of 10.1/29.6 and a platelet count of 216. Pathology review of smear showed normocytic anemia and no other abnormal findings. CMP was within normal limits. Reticulocyte count was 1.5% which was low for the degree of anemia. B12 and folate were within normal limits. TSH was normal at 1.75. Multiple myeloma panel showed [place] IgA monoclonal protein of 0.3 g with kappa light chain specificity. Iron studies were within normal limits. ESR was elevated at 63.   Bone marrow biopsy showed 9% clonal plasma cells. Cytogenetics normal  Skeletal survey- Subcentimeter lucencies in the frontal and anterior parietal wall and in the right femoral neck may reflect myelomatous involvement. Mild multilevel degenerative disc disease of the  spine. Thoracic aortic atherosclerosis  Interval history- doing well. Denies any complaints  ECOG PS- 0 Pain scale- 0   Review of systems- Review of Systems  Constitutional: Negative for chills, fever, malaise/fatigue and weight loss.  HENT: Negative for congestion, ear discharge and nosebleeds.   Eyes: Negative for blurred vision.  Respiratory: Negative for cough, hemoptysis, sputum production, shortness of breath and wheezing.   Cardiovascular: Negative for chest pain, palpitations, orthopnea and claudication.  Gastrointestinal: Negative for abdominal pain, blood in stool, constipation, diarrhea, heartburn, melena, nausea and vomiting.  Genitourinary: Negative for dysuria, flank pain, frequency, hematuria and urgency.  Musculoskeletal: Negative for back pain, joint pain and myalgias.  Skin: Negative for rash.  Neurological: Negative for dizziness, tingling, focal weakness, seizures, weakness and headaches.  Endo/Heme/Allergies: Does not bruise/bleed easily.  Psychiatric/Behavioral: Negative for depression and suicidal ideas. The patient does not have insomnia.       No Known Allergies   Past Medical History:  Diagnosis Date  . Abnormal heart rhythm   . AICD (automatic cardioverter/defibrillator) present   . Anemia   . Chronic systolic CHF (congestive heart failure), NYHA class 2- 3 ([place]) 07/03/2016  . COPD (chronic obstructive pulmonary disease) ([place])   . Diabetes mellitus without complication ([place])   . LBBB (left bundle branch block) 07/03/2016  . NICM (nonischemic cardiomyopathy) ([place]) 07/03/2016  . Shortness of breath      Past Surgical History:  Procedure Laterality Date  . APPENDECTOMY    . CARDIAC CATHETERIZATION Left 03/14/2015   Procedure: Left Heart Cath;  Surgeon: Dionisio David, MD;  Location: [place] [facility];  Service: Cardiovascular;  Laterality:  Left;  . EP IMPLANTABLE DEVICE N/A 07/16/2016   Procedure: BiV ICD Insertion CRT-D;  Surgeon: Deboraha Sprang, MD;   Location: [place] [facility];  Service: Cardiovascular;  Laterality: N/A;  . ICD IMPLANT    . TUBAL LIGATION Bilateral     Social History   Social History  . Marital status: Single    Spouse name: N/A  . Number of children: N/A  . Years of education: N/A   Occupational History  . Not on file.   Social History Main Topics  . Smoking status: Current Every Day Smoker    Years: 30.00    Types: Cigarettes    Last attempt to quit: 01/28/2015  . Smokeless tobacco: Never Used     Comment: Skyy admits she smokes 1/2 cigarette in am on the way to work and 1/2 cigarette on the way home from work.   . Alcohol use No  . Drug use: Yes    Types: Fentanyl  . Sexual activity: Not on file   Other Topics Concern  . Not on file   Social History Narrative  . No narrative on file    Family History  Problem Relation Age of Onset  . Hypertension Unknown   . Heart attack Unknown      Current Outpatient Prescriptions:  .  albuterol (PROVENTIL HFA;VENTOLIN HFA) 108 (90 BASE) MCG/ACT inhaler, Inhale 2 puffs into the lungs every 6 (six) hours as needed for wheezing., Disp: , Rfl:  .  calcium carbonate (OSCAL) 1500 (600 Ca) MG TABS tablet, Take by mouth 2 (two) times daily with a meal., Disp: , Rfl:  .  carvedilol (COREG) 25 MG tablet, Take 25 mg by mouth 2 (two) times daily with a meal., Disp: , Rfl:  .  cholecalciferol (VITAMIN D) 1000 units tablet, Take 1,000 Units by mouth daily., Disp: , Rfl:  .  diazepam (VALIUM) 5 MG tablet, Take 1 tablet (5 mg total) by mouth every 8 (eight) hours as needed for anxiety., Disp: 12 tablet, Rfl: 0 .  furosemide (LASIX) 20 MG tablet, Take 20 mg by mouth daily., Disp: , Rfl:  .  ibuprofen (ADVIL,MOTRIN) 200 MG tablet, Take 600 mg by mouth every 6 (six) hours as needed for mild pain., Disp: , Rfl:  .  lidocaine (LIDODERM) 5 %, Place 1 patch onto the skin every 12 (twelve) hours. Remove & Discard patch within 12 hours or as directed by MD, Disp: 10 patch,  Rfl: 0 .  Menthol, Topical Analgesic, (BENGAY EX), Apply 1 application topically daily as needed. Back pain, Disp: , Rfl:  .  Multiple Vitamin (MULTIVITAMIN WITH MINERALS) TABS tablet, Take 1 tablet by mouth daily., Disp: , Rfl:  .  Multiple Vitamins-Minerals (ICAPS AREDS FORMULA PO), Take 1 tablet by mouth daily., Disp: , Rfl:  .  sacubitril-valsartan (ENTRESTO) 97-103 MG, Take 1 tablet by mouth 2 (two) times daily., Disp: , Rfl:  .  spironolactone (ALDACTONE) 25 MG tablet, Take 25 mg by mouth daily., Disp: , Rfl:  .  traMADol (ULTRAM) 50 MG tablet, Take 1 tablet (50 mg total) by mouth every 6 (six) hours as needed., Disp: 12 tablet, Rfl: 0  Physical exam:  Vitals:   05/21/17 0930 05/21/17 0936  BP: (!) 141/105 131/79  Pulse: (!) 128 66  Temp: 98 F (36.7 C)   TempSrc: Tympanic   Weight: 173 lb 9.6 oz (78.7 kg)   Height: 5' 3"  (1.6 m)    Physical Exam  Constitutional: She is  oriented to person, place, and time and well-developed, well-nourished, and in no distress.  HENT:  Head: Normocephalic and atraumatic.  Eyes: Pupils are equal, round, and reactive to light. EOM are normal.  Neck: Normal range of motion.  Cardiovascular: Normal rate, regular rhythm and normal heart sounds.   Pulmonary/Chest: Effort normal and breath sounds normal.  Abdominal: Soft. Bowel sounds are normal.  Neurological: She is alert and oriented to person, place, and time.  Skin: Skin is warm and dry.     CMP Latest Ref Rng & Units 05/21/2017  Glucose 65 - 99 mg/dL 159(H)  BUN 6 - 20 mg/dL 17  Creatinine 0.44 - 1.00 mg/dL 0.80  Sodium 135 - 145 mmol/L 135  Potassium 3.5 - 5.1 mmol/L 4.0  Chloride 101 - 111 mmol/L 103  CO2 22 - 32 mmol/L 25  Calcium 8.9 - 10.3 mg/dL 8.9  Total Protein 6.5 - 8.1 g/dL 6.9  Total Bilirubin 0.3 - 1.2 mg/dL 0.5  Alkaline Phos 38 - 126 U/L 53  AST 15 - 41 U/L 28  ALT 14 - 54 U/L 19   CBC Latest Ref Rng & Units 05/21/2017  WBC 3.6 - 11.0 K/uL 5.8  Hemoglobin 12.0 - 16.0  g/dL 10.6(L)  Hematocrit 35.0 - 47.0 % 30.5(L)  Platelets 150 - 440 K/uL 260      Assessment and plan- Patient is a 61 y.o. female who sees me for following issues:  1. Normocytic anemia- results of work up consistent with anemia of chronic disease. Bone marrow biopsy also did not reveal any other etiology. Hb stable around 10 for last 2  Years. Continue to monitor  2. IgA MGUS- myeloma labs pending. No evidence of renal failure or hypercalcemia. Anemia stable. Repeat labs in 6 months. Repeat skeletal survery in march 2019 given lucencies seen in the skull  rtc in 6 months with labs  Visit Diagnosis 1. Anemia of chronic disease   2. MGUS (monoclonal gammopathy of unknown significance)      Dr. Randa Evens, MD, MPH [facility] at [facility] Pager- [phone] 05/21/2017 11:26 AM

## 2017-05-21 NOTE — Progress Notes (Signed)
Patient here for follow up. She has recently had a back injury but is recovering. Her BP and HR were high on first assessment. Recheck WNL.

## 2017-05-22 LAB — KAPPA/LAMBDA LIGHT CHAINS
KAPPA, LAMDA LIGHT CHAIN RATIO: 2.07 — AB (ref 0.26–1.65)
Kappa free light chain: 24.8 mg/L — ABNORMAL HIGH (ref 3.3–19.4)
Lambda free light chains: 12 mg/L (ref 5.7–26.3)

## 2017-05-24 LAB — MULTIPLE MYELOMA PANEL, SERUM
ALBUMIN/GLOB SERPL: 1.4 (ref 0.7–1.7)
Albumin SerPl Elph-Mcnc: 3.8 g/dL (ref 2.9–4.4)
Alpha 1: 0.2 g/dL (ref 0.0–0.4)
Alpha2 Glob SerPl Elph-Mcnc: 0.6 g/dL (ref 0.4–1.0)
B-Globulin SerPl Elph-Mcnc: 1.2 g/dL (ref 0.7–1.3)
GAMMA GLOB SERPL ELPH-MCNC: 0.9 g/dL (ref 0.4–1.8)
GLOBULIN, TOTAL: 2.9 g/dL (ref 2.2–3.9)
IGA: 629 mg/dL — AB (ref 87–352)
IgG (Immunoglobin G), Serum: 766 mg/dL (ref 700–1600)
IgM, Serum: 108 mg/dL (ref 26–217)
M Protein SerPl Elph-Mcnc: 0.4 g/dL — ABNORMAL HIGH
Total Protein ELP: 6.7 g/dL (ref 6.0–8.5)

## 2017-05-27 ENCOUNTER — Encounter: Payer: Self-pay | Admitting: Cardiology

## 2017-06-07 ENCOUNTER — Ambulatory Visit (INDEPENDENT_AMBULATORY_CARE_PROVIDER_SITE_OTHER): Payer: BLUE CROSS/BLUE SHIELD

## 2017-06-07 ENCOUNTER — Telehealth: Payer: Self-pay

## 2017-06-07 DIAGNOSIS — I5022 Chronic systolic (congestive) heart failure: Secondary | ICD-10-CM

## 2017-06-07 DIAGNOSIS — Z95810 Presence of automatic (implantable) cardiac defibrillator: Secondary | ICD-10-CM

## 2017-06-07 NOTE — Progress Notes (Signed)
EPIC Encounter for ICM Monitoring  Patient Name: Margaret Hendricks is a 61 y.o. female Date: 06/07/2017 Primary Care Physican: McLean-Scocuzza, Nino Glow, MD Primary Cardiologist: Caryl Comes Electrophysiologist: Caryl Comes Dry Weight:Last ICM weight 167 lbs Bi-V Pacing: 98.4%          Attempted call to patient and unable to reach.   Transmission reviewed.    Thoracic impedance normal.  Prescribed dosage: Furosemide 20 mg 1 tablet daily  Labs: 12/20/2016 Creatinine 0.77, BUN 19, Potassium 4.3, Sodium 136 07/03/2016 Creatinine 0.80, BUN 15, Potassium 4.4, Sodium 140  Recommendations: Left voice mail with ICM number and encouraged to call for fluid symptoms.  Follow-up plan: ICM clinic phone appointment on 07/08/2017.   Copy of ICM check sent to device physician.   3 month ICM trend: 06/07/2017   1 Year ICM trend:      [name], RN 06/07/2017 1:18 PM

## 2017-06-07 NOTE — Telephone Encounter (Signed)
Remote ICM transmission received.  Attempted patient call and no answer or answering machine.  

## 2017-06-12 IMAGING — CR DG CHEST 2V
2 series · 2 of 2 positions shown · non-contrast
Comparison: No recent prior .

CLINICAL DATA: AICD.

EXAM:
CHEST  2 VIEW

[chest pa]
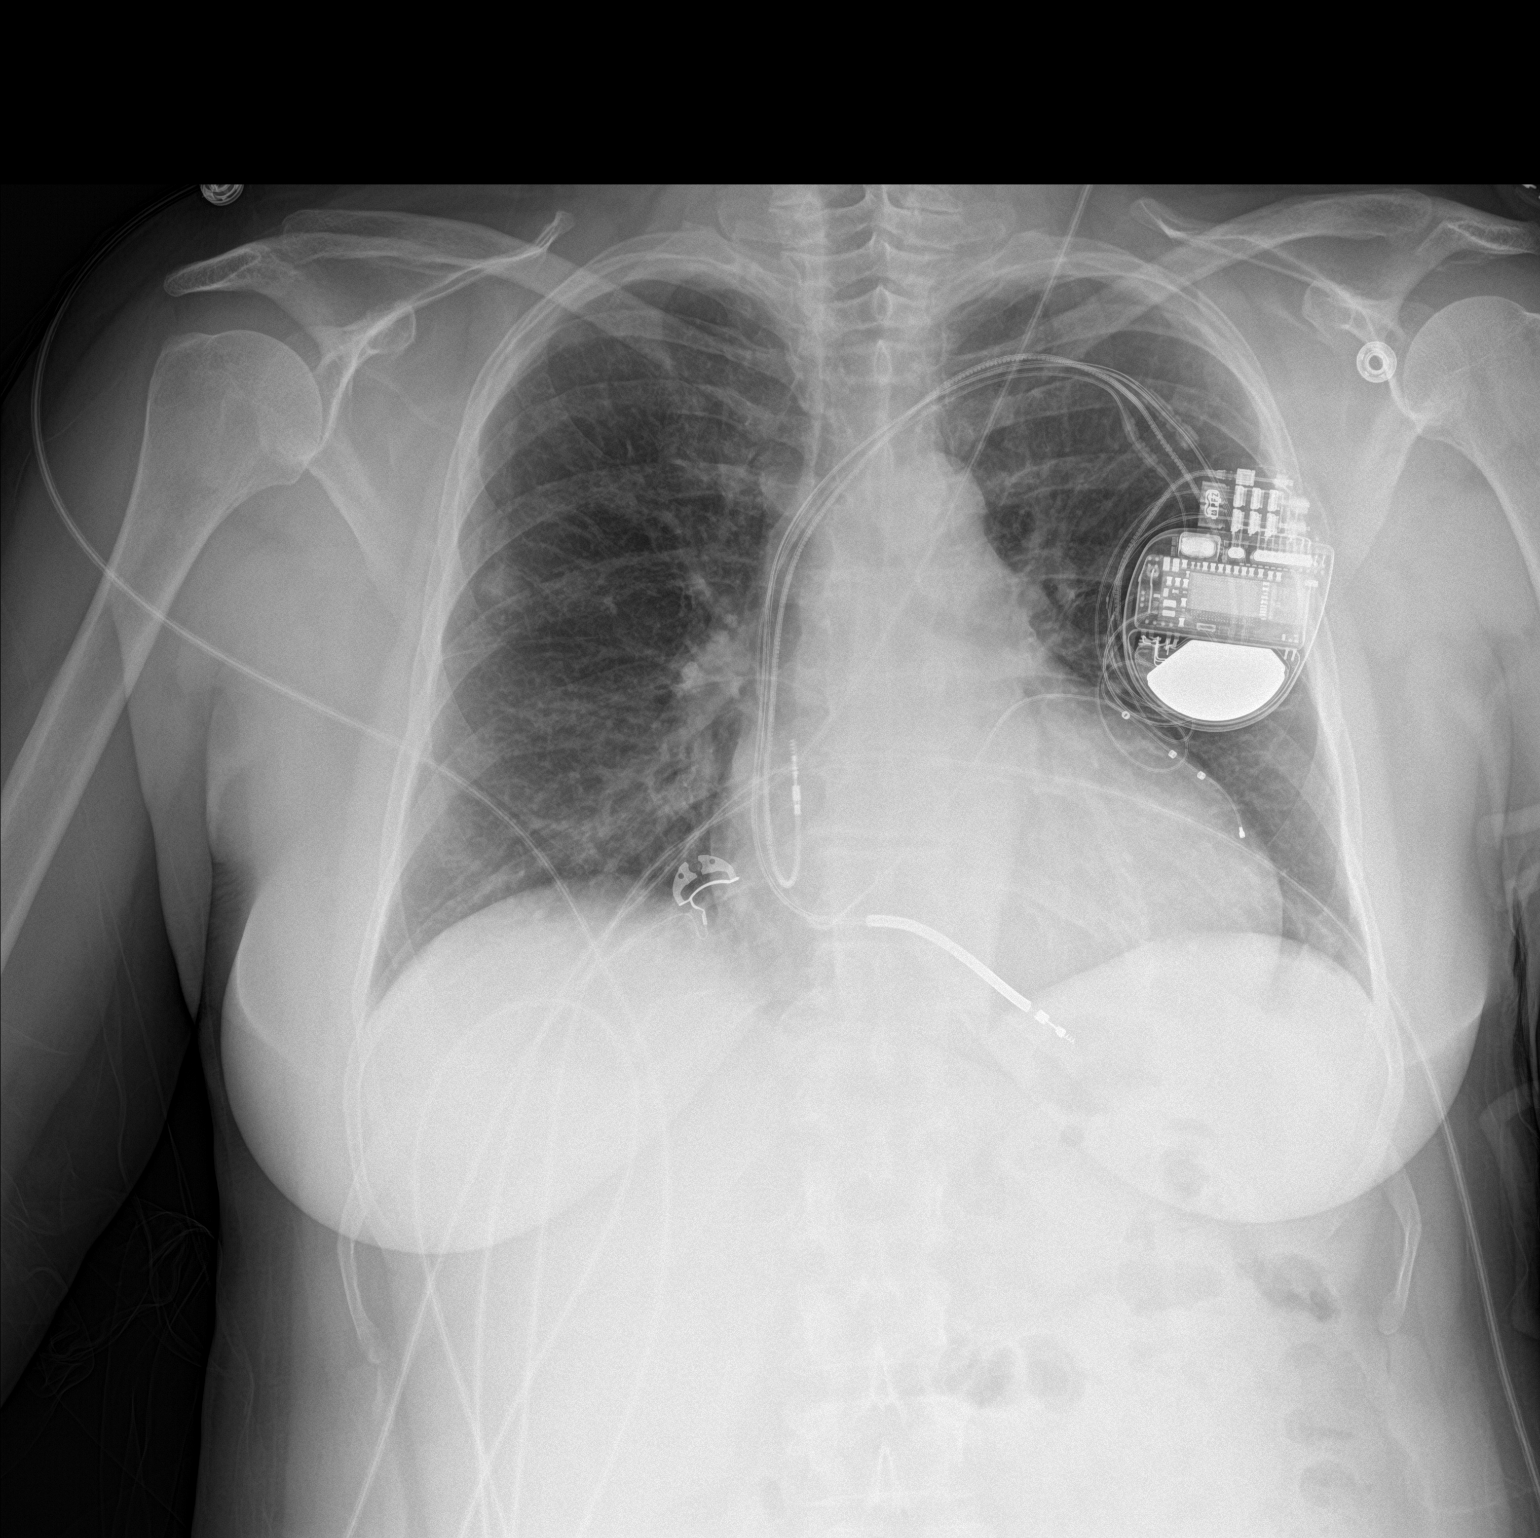

[chest lat]
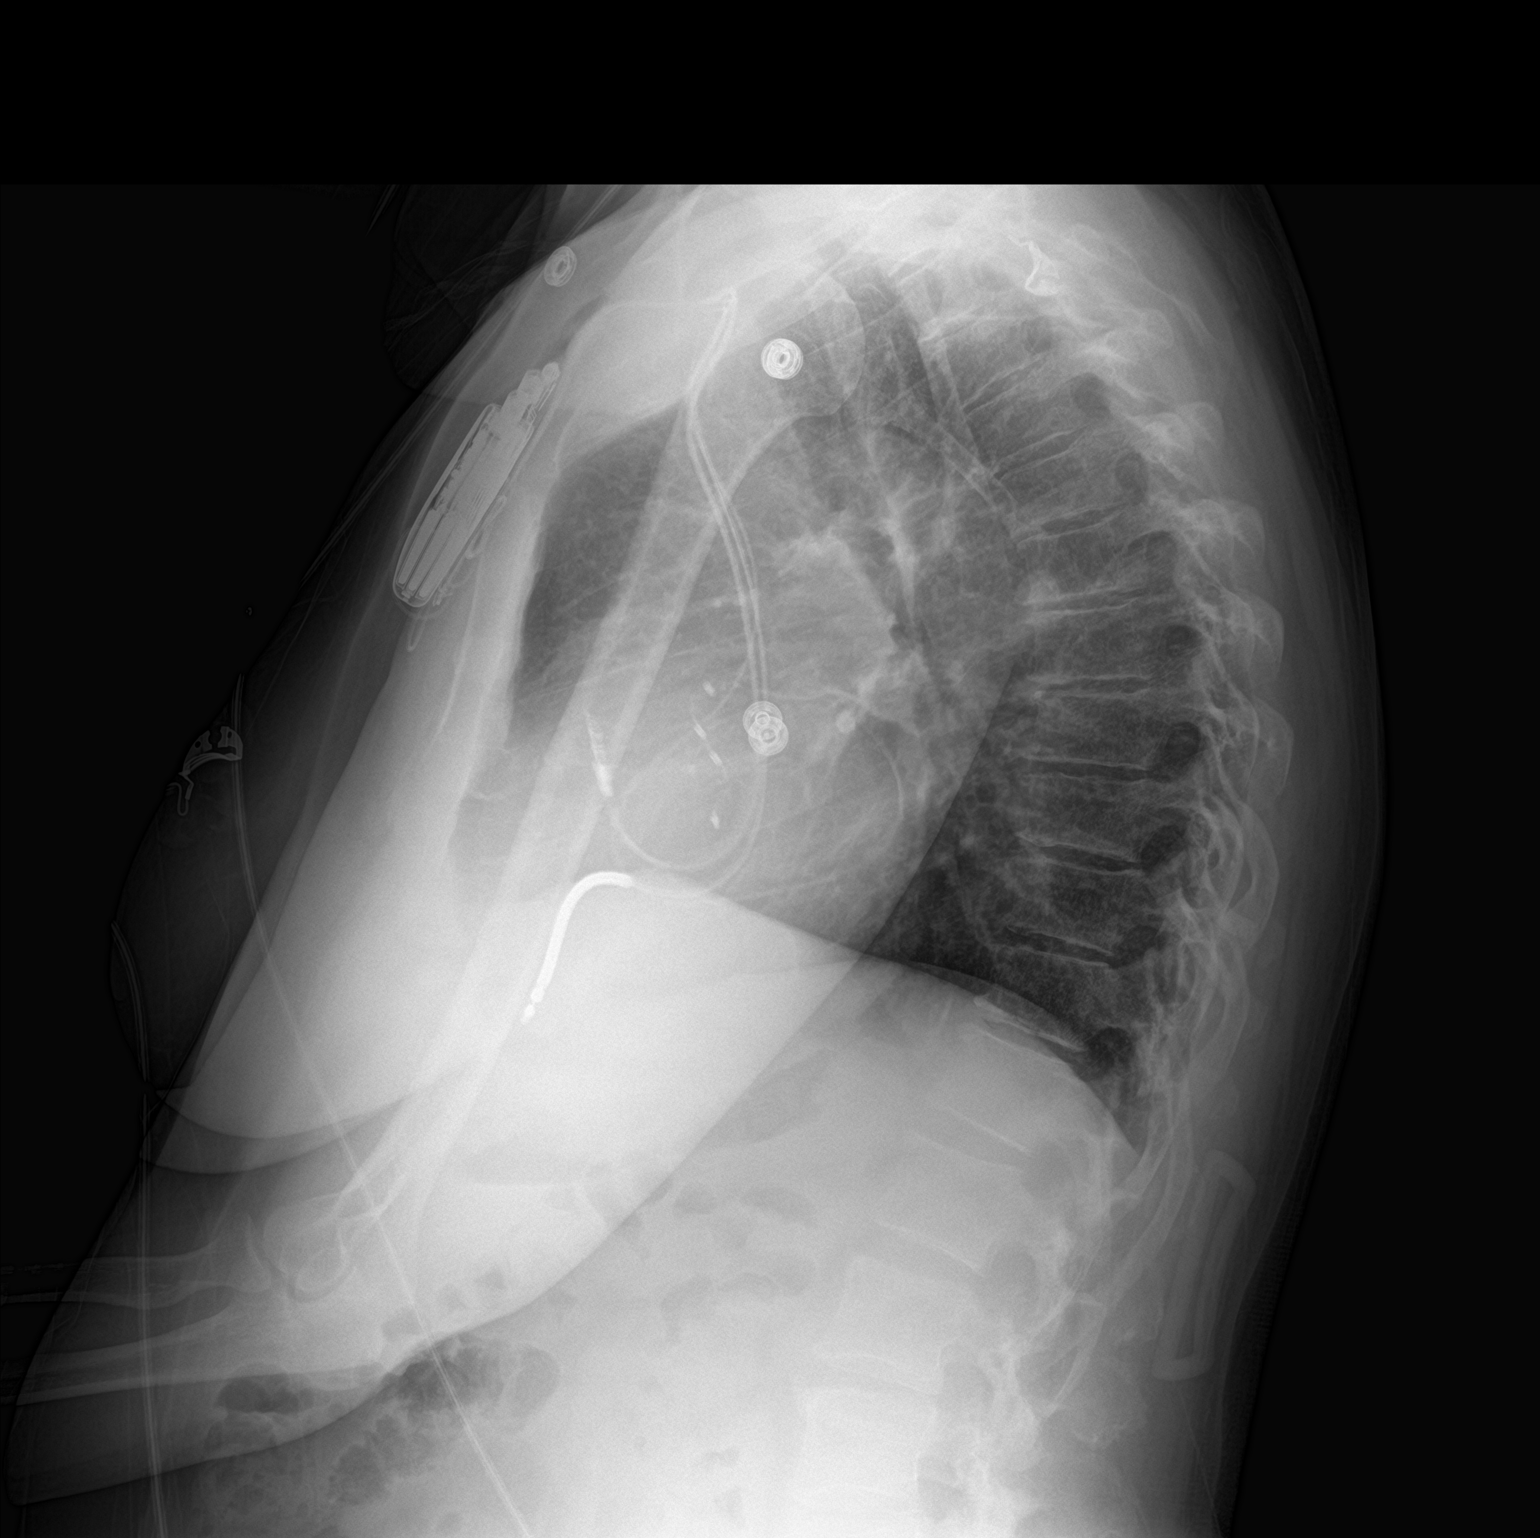

[2 of 2 positions shown; findings below may reference images not displayed]

FINDINGS: AICD noted. Cardiomegaly with normal pulmonary vascularity.
Questionable pulmonary nodule right upper lobe. Follow-up PA and
lateral chest x-ray suggested. This nodule persists nonenhanced
chest CT can be obtained for further evaluation. No pleural effusion
or pneumothorax. Degenerative changes thoracic spine.
IMPRESSION: 1. AICD noted.  Mild cardiomegaly.  No CHF.  No pneumothorax.

2. Questionable pulmonary nodule right upper lung. Repeat PA and
lateral chest x-ray suggested. This nodule persists nonenhanced
chest CT suggested for further evaluation

## 2017-06-27 ENCOUNTER — Other Ambulatory Visit: Payer: Self-pay | Admitting: Internal Medicine

## 2017-06-27 DIAGNOSIS — Z1231 Encounter for screening mammogram for malignant neoplasm of breast: Secondary | ICD-10-CM

## 2017-07-08 ENCOUNTER — Ambulatory Visit (INDEPENDENT_AMBULATORY_CARE_PROVIDER_SITE_OTHER): Payer: BLUE CROSS/BLUE SHIELD

## 2017-07-08 ENCOUNTER — Telehealth: Payer: Self-pay | Admitting: Cardiology

## 2017-07-08 DIAGNOSIS — I5022 Chronic systolic (congestive) heart failure: Secondary | ICD-10-CM | POA: Diagnosis not present

## 2017-07-08 DIAGNOSIS — Z95810 Presence of automatic (implantable) cardiac defibrillator: Secondary | ICD-10-CM | POA: Diagnosis not present

## 2017-07-08 NOTE — Telephone Encounter (Signed)
LMOVM reminding pt to send remote transmission.   

## 2017-07-09 NOTE — Progress Notes (Signed)
EPIC Encounter for ICM Monitoring  Patient Name: Margaret Hendricks is a 61 y.o. female Date: 07/09/2017 Primary Care Physican: McLean-Scocuzza, Nino Glow, MD Primary Cardiologist: Caryl Comes Electrophysiologist: Caryl Comes Dry Weight: 167 lbs Bi-V Pacing: 98.5%      Heart Failure questions reviewed, pt asymptomatic.   Thoracic impedance normal.  Prescribed dosage: Furosemide 20 mg 1 tablet daily  Labs: 05/21/2017 Creatinine 0.80, BUN 17, Potassium 4.0, Sodium 135, EGFR >60 12/20/2016 Creatinine 0.77, BUN 19, Potassium 4.3, Sodium 136 07/03/2016 Creatinine 0.80, BUN 15, Potassium 4.4, Sodium 140  Recommendations: No changes.   Encouraged to call for fluid symptoms.  Follow-up plan: ICM clinic phone appointment on 08/08/2017.  Advised she is due to schedule office appointment with Dr Caryl Comes for September.   Copy of ICM check sent to Dr. Caryl Comes.   3 month ICM trend: 07/09/2017   1 Year ICM trend:      [name], RN 07/09/2017 11:14 AM

## 2017-08-08 ENCOUNTER — Ambulatory Visit (INDEPENDENT_AMBULATORY_CARE_PROVIDER_SITE_OTHER): Payer: BLUE CROSS/BLUE SHIELD | Admitting: *Deleted

## 2017-08-08 DIAGNOSIS — Z95810 Presence of automatic (implantable) cardiac defibrillator: Secondary | ICD-10-CM

## 2017-08-08 DIAGNOSIS — I5022 Chronic systolic (congestive) heart failure: Secondary | ICD-10-CM

## 2017-08-08 DIAGNOSIS — I428 Other cardiomyopathies: Secondary | ICD-10-CM | POA: Diagnosis not present

## 2017-08-08 NOTE — Progress Notes (Signed)
Remote ICD transmission.   

## 2017-08-09 ENCOUNTER — Telehealth: Payer: Self-pay

## 2017-08-09 ENCOUNTER — Encounter: Payer: Self-pay | Admitting: Cardiology

## 2017-08-09 LAB — CUP PACEART REMOTE DEVICE CHECK
Brady Statistic AP VS Percent: 0.04 %
Brady Statistic RA Percent Paced: 0.71 %
Brady Statistic RV Percent Paced: 1.46 %
Date Time Interrogation Session: 20181011062603
HIGH POWER IMPEDANCE MEASURED VALUE: 67 Ohm
Implantable Lead Implant Date: 20170918
Implantable Lead Implant Date: 20170918
Implantable Lead Location: 753859
Implantable Lead Location: 753860
Implantable Pulse Generator Implant Date: 20170918
Lead Channel Impedance Value: 1007 Ohm
Lead Channel Impedance Value: 264.643
Lead Channel Impedance Value: 264.643
Lead Channel Impedance Value: 380 Ohm
Lead Channel Impedance Value: 494 Ohm
Lead Channel Impedance Value: 608 Ohm
Lead Channel Impedance Value: 931 Ohm
Lead Channel Impedance Value: 988 Ohm
Lead Channel Pacing Threshold Amplitude: 0.625 V
Lead Channel Sensing Intrinsic Amplitude: 13.5 mV
Lead Channel Sensing Intrinsic Amplitude: 13.5 mV
Lead Channel Setting Pacing Amplitude: 2.5 V
Lead Channel Setting Pacing Amplitude: 2.5 V
Lead Channel Setting Pacing Pulse Width: 0.8 ms
MDC IDC LEAD IMPLANT DT: 20170918
MDC IDC LEAD LOCATION: 753858
MDC IDC MSMT BATTERY REMAINING LONGEVITY: 100 mo
MDC IDC MSMT BATTERY VOLTAGE: 3.02 V
MDC IDC MSMT LEADCHNL LV IMPEDANCE VALUE: 247 Ohm
MDC IDC MSMT LEADCHNL LV IMPEDANCE VALUE: 272.552
MDC IDC MSMT LEADCHNL LV IMPEDANCE VALUE: 272.552
MDC IDC MSMT LEADCHNL LV IMPEDANCE VALUE: 494 Ohm
MDC IDC MSMT LEADCHNL LV IMPEDANCE VALUE: 570 Ohm
MDC IDC MSMT LEADCHNL LV IMPEDANCE VALUE: 931 Ohm
MDC IDC MSMT LEADCHNL LV IMPEDANCE VALUE: 950 Ohm
MDC IDC MSMT LEADCHNL LV IMPEDANCE VALUE: 988 Ohm
MDC IDC MSMT LEADCHNL LV PACING THRESHOLD AMPLITUDE: 1.25 V
MDC IDC MSMT LEADCHNL LV PACING THRESHOLD PULSEWIDTH: 0.8 ms
MDC IDC MSMT LEADCHNL RA IMPEDANCE VALUE: 437 Ohm
MDC IDC MSMT LEADCHNL RA PACING THRESHOLD AMPLITUDE: 0.375 V
MDC IDC MSMT LEADCHNL RA PACING THRESHOLD PULSEWIDTH: 0.4 ms
MDC IDC MSMT LEADCHNL RA SENSING INTR AMPL: 3.25 mV
MDC IDC MSMT LEADCHNL RA SENSING INTR AMPL: 3.25 mV
MDC IDC MSMT LEADCHNL RV IMPEDANCE VALUE: 456 Ohm
MDC IDC MSMT LEADCHNL RV PACING THRESHOLD PULSEWIDTH: 0.4 ms
MDC IDC SET LEADCHNL RA PACING AMPLITUDE: 2 V
MDC IDC SET LEADCHNL RV PACING PULSEWIDTH: 0.4 ms
MDC IDC SET LEADCHNL RV SENSING SENSITIVITY: 0.3 mV
MDC IDC STAT BRADY AP VP PERCENT: 0.68 %
MDC IDC STAT BRADY AS VP PERCENT: 97.88 %
MDC IDC STAT BRADY AS VS PERCENT: 1.41 %

## 2017-08-09 NOTE — Progress Notes (Signed)
EPIC Encounter for ICM Monitoring  Patient Name: Margaret Hendricks is a 61 y.o. female Date: 08/09/2017 Primary Care Physican: McLean-Scocuzza, Nino Glow, MD Primary Cardiologist: Caryl Comes Electrophysiologist: Caryl Comes Dry Weight:Previous weight 167 lbs Bi-V Pacing: 98.5%      Attempted call to patient and unable to reach.    Transmission reviewed.    Thoracic impedance normal.  Prescribed dosage: Furosemide 20 mg 1 tablet daily  Labs: 05/21/2017 Creatinine 0.80, BUN 17, Potassium 4.0, Sodium 135, EGFR >60 12/20/2016 Creatinine 0.77, BUN 19, Potassium 4.3, Sodium 136 07/03/2016 Creatinine 0.80, BUN 15, Potassium 4.4, Sodium 140  Recommendations: NONE - Unable to reach patient   Follow-up plan: ICM clinic phone appointment on 09/09/2017.  Due to make an appointment with Dr Caryl Comes.  Copy of ICM check sent to Dr. Caryl Comes.   3 month ICM trend: 08/09/2017   1 Year ICM trend:      [name], RN 08/09/2017 1:49 PM

## 2017-08-09 NOTE — Telephone Encounter (Signed)
Remote ICM transmission received.  Attempted call to patient on home phone and due to network difficulty it is nto working per recorded message.  Attempted cell phone and voice mail box has not been set up.

## 2017-08-23 ENCOUNTER — Encounter: Payer: Self-pay | Admitting: Cardiology

## 2017-09-09 ENCOUNTER — Ambulatory Visit (INDEPENDENT_AMBULATORY_CARE_PROVIDER_SITE_OTHER): Payer: BLUE CROSS/BLUE SHIELD

## 2017-09-09 DIAGNOSIS — Z95810 Presence of automatic (implantable) cardiac defibrillator: Secondary | ICD-10-CM | POA: Diagnosis not present

## 2017-09-09 DIAGNOSIS — I5022 Chronic systolic (congestive) heart failure: Secondary | ICD-10-CM | POA: Diagnosis not present

## 2017-09-10 NOTE — Progress Notes (Signed)
EPIC Encounter for ICM Monitoring  Patient Name: Margaret Hendricks is a 61 y.o. female Date: 09/10/2017 Primary Care Physican: McLean-Scocuzza, Nino Glow, MD Primary Cardiologist: Caryl Comes Electrophysiologist: Caryl Comes Dry Weight:Previous weight 167 lbs Bi-V Pacing: 98.5%        Attempted call to patient and unable to reach.  Left detailed message regarding transmission.  Transmission reviewed.    Thoracic impedance normal   Prescribed dosage: Furosemide 20 mg 1 tablet daily  Labs: 05/21/2017 Creatinine 0.80, BUN 17, Potassium 4.0, Sodium 135, EGFR >60 12/20/2016 Creatinine 0.77, BUN 19, Potassium 4.3, Sodium 136 07/03/2016 Creatinine 0.80, BUN 15, Potassium 4.4, Sodium 140  Recommendations: No changes.   Encouraged to call for fluid symptoms.  Follow-up plan: ICM clinic phone appointment on 10/10/2017.    Copy of ICM check sent to Dr. Caryl Comes.   3 month ICM trend: 09/09/2017    1 Year ICM trend:       [name], RN 09/10/2017 12:14 PM

## 2017-09-24 ENCOUNTER — Ambulatory Visit
Admission: RE | Admit: 2017-09-24 | Discharge: 2017-09-24 | Disposition: A | Discharge status: Home / Self Care | Payer: BLUE CROSS/BLUE SHIELD | Source: Ambulatory Visit | Attending: Internal Medicine | Admitting: Internal Medicine

## 2017-09-24 DIAGNOSIS — Z1231 Encounter for screening mammogram for malignant neoplasm of breast: Secondary | ICD-10-CM | POA: Diagnosis not present

## 2017-09-24 HISTORY — DX: Malignant (primary) neoplasm, unspecified: C80.1

## 2017-10-10 ENCOUNTER — Ambulatory Visit (INDEPENDENT_AMBULATORY_CARE_PROVIDER_SITE_OTHER): Payer: BLUE CROSS/BLUE SHIELD

## 2017-10-10 DIAGNOSIS — I5022 Chronic systolic (congestive) heart failure: Secondary | ICD-10-CM

## 2017-10-10 DIAGNOSIS — Z95810 Presence of automatic (implantable) cardiac defibrillator: Secondary | ICD-10-CM | POA: Diagnosis not present

## 2017-10-15 ENCOUNTER — Other Ambulatory Visit: Payer: Self-pay | Admitting: Specialist

## 2017-10-15 DIAGNOSIS — R0602 Shortness of breath: Secondary | ICD-10-CM

## 2017-10-15 DIAGNOSIS — Z720 Tobacco use: Secondary | ICD-10-CM

## 2017-10-15 DIAGNOSIS — J449 Chronic obstructive pulmonary disease, unspecified: Secondary | ICD-10-CM

## 2017-10-15 DIAGNOSIS — R911 Solitary pulmonary nodule: Secondary | ICD-10-CM

## 2017-10-15 NOTE — Progress Notes (Signed)
EPIC Encounter for ICM Monitoring  Patient Name: Margaret Hendricks is a 61 y.o. female Date: 10/15/2017 Primary Care Physican: McLean-Scocuzza, Nino Glow, MD Primary Cardiologist: Caryl Comes Electrophysiologist: Caryl Comes Dry Weight:Previous [id number] lbs Bi-V Pacing: 98.5%        Transmission received.   Thoracic impedance normal.  Prescribed dosage: Furosemide 20 mg 1 tablet daily  Labs: 05/21/2017 Creatinine 0.80, BUN 17, Potassium 4.0, Sodium 135, EGFR >60 12/20/2016 Creatinine 0.77, BUN 19, Potassium 4.3, Sodium 136 07/03/2016 Creatinine 0.80, BUN 15, Potassium 4.4, Sodium 140  Recommendations: None.  Follow-up plan: ICM clinic phone appointment on 11/11/2017.    Copy of ICM check sent to Dr. Caryl Comes.   3 month ICM trend: 10/15/2017    1 Year ICM trend:       [name], RN 10/15/2017 8:35 AM

## 2017-11-11 ENCOUNTER — Telehealth: Payer: Self-pay

## 2017-11-11 ENCOUNTER — Ambulatory Visit (INDEPENDENT_AMBULATORY_CARE_PROVIDER_SITE_OTHER): Payer: BLUE CROSS/BLUE SHIELD | Admitting: *Deleted

## 2017-11-11 DIAGNOSIS — I5022 Chronic systolic (congestive) heart failure: Secondary | ICD-10-CM

## 2017-11-11 DIAGNOSIS — Z95810 Presence of automatic (implantable) cardiac defibrillator: Secondary | ICD-10-CM

## 2017-11-11 DIAGNOSIS — I428 Other cardiomyopathies: Secondary | ICD-10-CM

## 2017-11-11 NOTE — Telephone Encounter (Signed)
Remote ICM transmission received.  Attempted call to patient and left detailed message per DPR regarding transmission and next ICM scheduled for 12/12/2017.  Advised to return call for any fluid symptoms or questions.    

## 2017-11-11 NOTE — Progress Notes (Signed)
EPIC Encounter for ICM Monitoring  Patient Name: Margaret Hendricks is a 62 y.o. female Date: 11/11/2017 Primary Care Physican: McLean-Scocuzza, Nino Glow, MD Primary Cardiologist: Caryl Comes Electrophysiologist: Caryl Comes Dry Weight:Previous weight 167 lbs Bi-V Pacing: 98.4%      Attempted call to patient and unable to reach.  Left detailed message regarding transmission.  Transmission reviewed.    Thoracic impedance normal.  Prescribed dosage: Furosemide 20 mg 1 tablet daily  Labs: 05/21/2017 Creatinine 0.80, BUN 17, Potassium 4.0, Sodium 135, EGFR >60 12/20/2016 Creatinine 0.77, BUN 19, Potassium 4.3, Sodium 136 07/03/2016 Creatinine 0.80, BUN 15, Potassium 4.4, Sodium 140  Recommendations: Left voice mail with ICM number and encouraged to call if experiencing any fluid symptoms.  Follow-up plan: ICM clinic phone appointment on 12/12/2017.    Copy of ICM check sent to Dr. Caryl Comes.   3 month ICM trend: 11/11/2017    1 Year ICM trend:       [name], RN 11/11/2017 4:18 PM

## 2017-11-13 LAB — CUP PACEART REMOTE DEVICE CHECK
Battery Remaining Longevity: 104 mo
Battery Voltage: 3.02 V
Brady Statistic AS VP Percent: 97.47 %
Brady Statistic AS VS Percent: 1.43 %
Brady Statistic RV Percent Paced: 0.61 %
HighPow Impedance: 69 Ohm
Implantable Lead Implant Date: 20170918
Implantable Lead Implant Date: 20170918
Implantable Lead Location: 753858
Implantable Lead Location: 753859
Implantable Lead Model: 5076
Lead Channel Impedance Value: 1007 Ohm
Lead Channel Impedance Value: 1007 Ohm
Lead Channel Impedance Value: 1121 Ohm
Lead Channel Impedance Value: 437 Ohm
Lead Channel Impedance Value: 551 Ohm
Lead Channel Impedance Value: 570 Ohm
Lead Channel Impedance Value: 646 Ohm
Lead Channel Impedance Value: 703 Ohm
Lead Channel Pacing Threshold Amplitude: 0.625 V
Lead Channel Pacing Threshold Amplitude: 1.25 V
Lead Channel Pacing Threshold Pulse Width: 0.4 ms
Lead Channel Pacing Threshold Pulse Width: 0.8 ms
Lead Channel Sensing Intrinsic Amplitude: 14.875 mV
Lead Channel Sensing Intrinsic Amplitude: 14.875 mV
Lead Channel Sensing Intrinsic Amplitude: 3.625 mV
Lead Channel Sensing Intrinsic Amplitude: 3.625 mV
Lead Channel Setting Pacing Amplitude: 1.75 V
Lead Channel Setting Pacing Amplitude: 2 V
Lead Channel Setting Pacing Pulse Width: 0.8 ms
MDC IDC LEAD IMPLANT DT: 20170918
MDC IDC LEAD LOCATION: 753860
MDC IDC MSMT LEADCHNL LV IMPEDANCE VALUE: 1007 Ohm
MDC IDC MSMT LEADCHNL LV IMPEDANCE VALUE: 1102 Ohm
MDC IDC MSMT LEADCHNL LV IMPEDANCE VALUE: 1102 Ohm
MDC IDC MSMT LEADCHNL LV IMPEDANCE VALUE: 280.169
MDC IDC MSMT LEADCHNL LV IMPEDANCE VALUE: 297.365
MDC IDC MSMT LEADCHNL LV IMPEDANCE VALUE: 302.813
MDC IDC MSMT LEADCHNL LV IMPEDANCE VALUE: 308.894
MDC IDC MSMT LEADCHNL LV IMPEDANCE VALUE: 314.776
MDC IDC MSMT LEADCHNL RA PACING THRESHOLD AMPLITUDE: 0.375 V
MDC IDC MSMT LEADCHNL RA PACING THRESHOLD PULSEWIDTH: 0.4 ms
MDC IDC MSMT LEADCHNL RV IMPEDANCE VALUE: 399 Ohm
MDC IDC MSMT LEADCHNL RV IMPEDANCE VALUE: 551 Ohm
MDC IDC PG IMPLANT DT: 20170918
MDC IDC SESS DTM: 20190114062204
MDC IDC SET LEADCHNL RV PACING AMPLITUDE: 2.5 V
MDC IDC SET LEADCHNL RV PACING PULSEWIDTH: 0.4 ms
MDC IDC SET LEADCHNL RV SENSING SENSITIVITY: 0.3 mV
MDC IDC STAT BRADY AP VP PERCENT: 1.05 %
MDC IDC STAT BRADY AP VS PERCENT: 0.05 %
MDC IDC STAT BRADY RA PERCENT PACED: 1.1 %

## 2017-11-13 NOTE — Progress Notes (Signed)
Remote ICD transmission.   

## 2017-11-15 ENCOUNTER — Encounter: Payer: Self-pay | Admitting: Cardiology

## 2017-11-21 ENCOUNTER — Encounter: Payer: Self-pay | Admitting: Oncology

## 2017-11-21 ENCOUNTER — Inpatient Hospital Stay: Payer: BLUE CROSS/BLUE SHIELD

## 2017-11-21 ENCOUNTER — Inpatient Hospital Stay: Payer: BLUE CROSS/BLUE SHIELD | Attending: Oncology | Admitting: Oncology

## 2017-11-21 ENCOUNTER — Telehealth: Payer: Self-pay | Admitting: Oncology

## 2017-11-21 VITALS — BP 110/64 | HR 59 | Temp 96.6°F | Resp 15 | Wt 178.0 lb

## 2017-11-21 DIAGNOSIS — I420 Dilated cardiomyopathy: Secondary | ICD-10-CM | POA: Insufficient documentation

## 2017-11-21 DIAGNOSIS — I5022 Chronic systolic (congestive) heart failure: Secondary | ICD-10-CM | POA: Insufficient documentation

## 2017-11-21 DIAGNOSIS — Z87891 Personal history of nicotine dependence: Secondary | ICD-10-CM | POA: Diagnosis not present

## 2017-11-21 DIAGNOSIS — D638 Anemia in other chronic diseases classified elsewhere: Secondary | ICD-10-CM

## 2017-11-21 DIAGNOSIS — D472 Monoclonal gammopathy: Secondary | ICD-10-CM

## 2017-11-21 DIAGNOSIS — I447 Left bundle-branch block, unspecified: Secondary | ICD-10-CM | POA: Diagnosis not present

## 2017-11-21 DIAGNOSIS — E119 Type 2 diabetes mellitus without complications: Secondary | ICD-10-CM | POA: Insufficient documentation

## 2017-11-21 DIAGNOSIS — I700 Atherosclerosis of aorta: Secondary | ICD-10-CM | POA: Diagnosis not present

## 2017-11-21 LAB — CBC WITH DIFFERENTIAL/PLATELET
Basophils Absolute: 0 10*3/uL (ref 0–0.1)
Basophils Relative: 1 %
EOS ABS: 0.2 10*3/uL (ref 0–0.7)
EOS PCT: 2 %
HCT: 31.6 % — ABNORMAL LOW (ref 35.0–47.0)
Hemoglobin: 10.5 g/dL — ABNORMAL LOW (ref 12.0–16.0)
LYMPHS ABS: 3.1 10*3/uL (ref 1.0–3.6)
Lymphocytes Relative: 44 %
MCH: 30.6 pg (ref 26.0–34.0)
MCHC: 33.2 g/dL (ref 32.0–36.0)
MCV: 92 fL (ref 80.0–100.0)
MONOS PCT: 6 %
Monocytes Absolute: 0.4 10*3/uL (ref 0.2–0.9)
Neutro Abs: 3.4 10*3/uL (ref 1.4–6.5)
Neutrophils Relative %: 47 %
PLATELETS: 242 10*3/uL (ref 150–440)
RBC: 3.44 MIL/uL — ABNORMAL LOW (ref 3.80–5.20)
RDW: 14.5 % (ref 11.5–14.5)
WBC: 7 10*3/uL (ref 3.6–11.0)

## 2017-11-21 LAB — COMPREHENSIVE METABOLIC PANEL
ALT: 17 U/L (ref 14–54)
AST: 22 U/L (ref 15–41)
Albumin: 4.3 g/dL (ref 3.5–5.0)
Alkaline Phosphatase: 58 U/L (ref 38–126)
Anion gap: 9 (ref 5–15)
BUN: 16 mg/dL (ref 6–20)
CHLORIDE: 102 mmol/L (ref 101–111)
CO2: 28 mmol/L (ref 22–32)
CREATININE: 0.84 mg/dL (ref 0.44–1.00)
Calcium: 9.2 mg/dL (ref 8.9–10.3)
GFR calc Af Amer: >60 mL/min (ref >60)
GFR calc non Af Amer: >60 mL/min (ref >60)
Glucose, Bld: 115 mg/dL — ABNORMAL HIGH (ref 65–99)
Potassium: 4 mmol/L (ref 3.5–5.1)
SODIUM: 139 mmol/L (ref 135–145)
Total Bilirubin: 0.4 mg/dL (ref 0.3–1.2)
Total Protein: 7.4 g/dL (ref 6.5–8.1)

## 2017-11-21 NOTE — Telephone Encounter (Signed)
Lab schd for 6 mths, per 11/21/17 los.  Lab/MD in 1 year, per 11/21/17 los. Also, wrote Bone Survey to be done in March, 2019 on Appt schd. Appts mailed per patient request. MF

## 2017-11-21 NOTE — Progress Notes (Signed)
Hematology/Oncology Consult note [facility]  Telephone:([phone] [phone]  Patient Care Team: McLean-Scocuzza, Nino Glow, MD as PCP - General (Internal Medicine)   Name of the patient: Margaret Hendricks  [id number]  06/30/1956   Date of visit: 11/21/17  Diagnosis- 1. IgA MGUS 2. Normocytic anemia likely from chronic disease  Chief complaint/ Reason for visit- routine f/u of anemia and MGUS  Heme/Onc history:patient is a 62 year old female with past medical history significant for dilated cardiomyopathy status postAICD placement and patient reports that her most recent EF was 55%. She also has diabetes among other medical problems. Based on her recent CBC from 11/26/2016 patient was noted to have a white count of 6.7, H&H of 10.4/31.9 and platelet count of 276. Ferritin was normal at 128. Hemoglobin A1c was 6.3. MCV was 90.0 patient reports that her ejection fraction was 25% in the past and she used to feel significant fatigue at that time but since the placement of her AICD for energy levels are improving. She otherwise feels well and denies other complaints today  Results of blood work from 12/20/2016 were as follows: CBC showed white count of 6.6, H&H of 10.1/29.6 and a platelet count of 216. Pathology review of smear showed normocytic anemia and no other abnormal findings. CMP was within normal limits. Reticulocyte count was 1.5% which was low for the degree of anemia. B12 and folate were within normal limits. TSH was normal at 1.75. Multiple myeloma panel showed [place] IgA monoclonal protein of 0.3 g with kappa light chain specificity. Iron studies were within normal limits. ESR was elevated at 63.   Bone marrow biopsy showed 9% clonal plasma cells. Cytogenetics normal  Skeletal survey- Subcentimeter lucencies in the frontal and anterior parietal wall and in the right femoral neck may reflect myelomatous involvement. Mild multilevel degenerative  disc disease of the spine. Thoracic aortic atherosclerosis    Interval history-patient reports doing well.  Denies any symptoms of fatigue, blood loss in stool or urine.  Denies any aches or pains anywhere  ECOG PS- 0 Pain scale- 0   Review of systems- Review of Systems  Constitutional: Negative for chills, fever, malaise/fatigue and weight loss.  HENT: Negative for congestion, ear discharge and nosebleeds.   Eyes: Negative for blurred vision.  Respiratory: Negative for cough, hemoptysis, sputum production, shortness of breath and wheezing.   Cardiovascular: Negative for chest pain, palpitations, orthopnea and claudication.  Gastrointestinal: Negative for abdominal pain, blood in stool, constipation, diarrhea, heartburn, melena, nausea and vomiting.  Genitourinary: Negative for dysuria, flank pain, frequency, hematuria and urgency.  Musculoskeletal: Negative for back pain, joint pain and myalgias.  Skin: Negative for rash.  Neurological: Negative for dizziness, tingling, focal weakness, seizures, weakness and headaches.  Endo/Heme/Allergies: Does not bruise/bleed easily.  Psychiatric/Behavioral: Negative for depression and suicidal ideas. The patient does not have insomnia.       No Known Allergies   Past Medical History:  Diagnosis Date  . Abnormal heart rhythm   . AICD (automatic cardioverter/defibrillator) present   . Anemia   . Cancer ([place])    skin  . Chronic systolic CHF (congestive heart failure), NYHA class 2- 3 ([place]) 07/03/2016  . COPD (chronic obstructive pulmonary disease) ([place])   . Diabetes mellitus without complication ([place])   . LBBB (left bundle branch block) 07/03/2016  . NICM (nonischemic cardiomyopathy) ([place]) 07/03/2016  . Shortness of breath      Past Surgical History:  Procedure Laterality Date  . APPENDECTOMY    .  CARDIAC CATHETERIZATION Left 03/14/2015   Procedure: Left Heart Cath;  Surgeon: Dionisio David, MD;  Location: [place] [facility];  Service:  Cardiovascular;  Laterality: Left;  . EP IMPLANTABLE DEVICE N/A 07/16/2016   Procedure: BiV ICD Insertion CRT-D;  Surgeon: Deboraha Sprang, MD;  Location: [place] [facility];  Service: Cardiovascular;  Laterality: N/A;  . ICD IMPLANT    . TUBAL LIGATION Bilateral     Social History   Socioeconomic History  . Marital status: Single    Spouse name: Not on file  . Number of children: Not on file  . Years of education: Not on file  . Highest education level: Not on file  Social Needs  . Financial resource strain: Not on file  . Food insecurity - worry: Not on file  . Food insecurity - inability: Not on file  . Transportation needs - medical: Not on file  . Transportation needs - non-medical: Not on file  Occupational History  . Not on file  Tobacco Use  . Smoking status: Current Every Day Smoker    Years: 30.00    Types: Cigarettes    Last attempt to quit: 01/28/2015    Years since quitting: 2.8  . Smokeless tobacco: Never Used  . Tobacco comment: Savannaha admits she smokes 1/2 cigarette in am on the way to work and 1/2 cigarette on the way home from work.   Substance and Sexual Activity  . Alcohol use: No    Alcohol/week: 0.0 oz  . Drug use: Yes    Types: Fentanyl  . Sexual activity: Not on file  Other Topics Concern  . Not on file  Social History Narrative  . Not on file    Family History  Problem Relation Age of Onset  . Hypertension Unknown   . Heart attack Unknown      Current Outpatient Medications:  .  albuterol (PROVENTIL HFA;VENTOLIN HFA) 108 (90 BASE) MCG/ACT inhaler, Inhale 2 puffs into the lungs every 6 (six) hours as needed for wheezing., Disp: , Rfl:  .  calcium carbonate (OSCAL) 1500 (600 Ca) MG TABS tablet, Take by mouth 2 (two) times daily with a meal., Disp: , Rfl:  .  carvedilol (COREG) 25 MG tablet, Take 25 mg by mouth 2 (two) times daily with a meal., Disp: , Rfl:  .  cholecalciferol (VITAMIN D) 1000 units tablet, Take 1,000 Units by mouth daily.,  Disp: , Rfl:  .  diazepam (VALIUM) 5 MG tablet, Take 1 tablet (5 mg total) by mouth every 8 (eight) hours as needed for anxiety., Disp: 12 tablet, Rfl: 0 .  furosemide (LASIX) 20 MG tablet, Take 20 mg by mouth daily., Disp: , Rfl:  .  ibuprofen (ADVIL,MOTRIN) 200 MG tablet, Take 600 mg by mouth every 6 (six) hours as needed for mild pain., Disp: , Rfl:  .  lidocaine (LIDODERM) 5 %, Place 1 patch onto the skin every 12 (twelve) hours. Remove & Discard patch within 12 hours or as directed by MD, Disp: 10 patch, Rfl: 0 .  Menthol, Topical Analgesic, (BENGAY EX), Apply 1 application topically daily as needed. Back pain, Disp: , Rfl:  .  Multiple Vitamin (MULTIVITAMIN WITH MINERALS) TABS tablet, Take 1 tablet by mouth daily., Disp: , Rfl:  .  Multiple Vitamins-Minerals (ICAPS AREDS FORMULA PO), Take 1 tablet by mouth daily., Disp: , Rfl:  .  sacubitril-valsartan (ENTRESTO) 97-103 MG, Take 1 tablet by mouth 2 (two) times daily., Disp: , Rfl:  .  spironolactone (ALDACTONE) 25 MG tablet, Take 25 mg by mouth daily., Disp: , Rfl:  .  traMADol (ULTRAM) 50 MG tablet, Take 1 tablet (50 mg total) by mouth every 6 (six) hours as needed., Disp: 12 tablet, Rfl: 0  Physical exam:  Vitals:   11/21/17 1337  BP: 110/64  Pulse: (!) 59  Resp: 15  Temp: (!) 96.6 F (35.9 C)  TempSrc: Tympanic  Weight: 178 lb (80.7 kg)   Physical Exam  Constitutional: She is oriented to person, place, and time and well-developed, well-nourished, and in no distress.  HENT:  Head: Normocephalic and atraumatic.  Eyes: EOM are normal. Pupils are equal, round, and reactive to light.  Neck: Normal range of motion.  Cardiovascular: Normal rate, regular rhythm and normal heart sounds.  Pulmonary/Chest: Effort normal and breath sounds normal.  Abdominal: Soft. Bowel sounds are normal.  Neurological: She is alert and oriented to person, place, and time.  Skin: Skin is warm and dry.     CMP Latest Ref Rng & Units 11/21/2017  Glucose  65 - 99 mg/dL 115(H)  BUN 6 - 20 mg/dL 16  Creatinine 0.44 - 1.00 mg/dL 0.84  Sodium 135 - 145 mmol/L 139  Potassium 3.5 - 5.1 mmol/L 4.0  Chloride 101 - 111 mmol/L 102  CO2 22 - 32 mmol/L 28  Calcium 8.9 - 10.3 mg/dL 9.2  Total Protein 6.5 - 8.1 g/dL 7.4  Total Bilirubin 0.3 - 1.2 mg/dL 0.4  Alkaline Phos 38 - 126 U/L 58  AST 15 - 41 U/L 22  ALT 14 - 54 U/L 17   CBC Latest Ref Rng & Units 11/21/2017  WBC 3.6 - 11.0 K/uL 7.0  Hemoglobin 12.0 - 16.0 g/dL 10.5(L)  Hematocrit 35.0 - 47.0 % 31.6(L)  Platelets 150 - 440 K/uL 242     Assessment and plan- Patient is a 62 y.o. female with following issues:  1.  Normocytic anemia: Likely secondary to chronic disease.  Her hemoglobin has been stable around 10.5 since 2016 workup was otherwise unremarkable as mentioned above.  Continue to monitor  2.  IgA MGUS: Myeloma labs from today are pending.  Her IgA levels have remained stable over the last 1 year around 620.  M protein is stable around 0.4 g.Serum free light chain ratio has been stable between 1.8- 2.  Repeat CBC, CMP, myeloma panel and serum free light chains in 6 months and I will see her back with the same labs in 1 year done 1 week prior.  She was noted to have some possible lucencies in her skull based on bone survey in March 2018.  I will repeat her bone survey in 2 months time   Visit Diagnosis 1. MGUS (monoclonal gammopathy of unknown significance)   2. Anemia of chronic disease      Dr. Randa Evens, MD, MPH [facility] at [facility] Pager- [phone] 11/21/2017 1:25 PM

## 2017-11-22 LAB — KAPPA/LAMBDA LIGHT CHAINS
KAPPA FREE LGHT CHN: 24.4 mg/L — AB (ref 3.3–19.4)
KAPPA, LAMDA LIGHT CHAIN RATIO: 1.52 (ref 0.26–1.65)
LAMDA FREE LIGHT CHAINS: 16.1 mg/L (ref 5.7–26.3)

## 2017-11-25 LAB — MULTIPLE MYELOMA PANEL, SERUM
ALBUMIN/GLOB SERPL: 1.2 (ref 0.7–1.7)
Albumin SerPl Elph-Mcnc: 3.7 g/dL (ref 2.9–4.4)
Alpha 1: 0.2 g/dL (ref 0.0–0.4)
Alpha2 Glob SerPl Elph-Mcnc: 0.7 g/dL (ref 0.4–1.0)
B-Globulin SerPl Elph-Mcnc: 1.3 g/dL (ref 0.7–1.3)
GAMMA GLOB SERPL ELPH-MCNC: 1 g/dL (ref 0.4–1.8)
GLOBULIN, TOTAL: 3.3 g/dL (ref 2.2–3.9)
IGA: 641 mg/dL — AB (ref 87–352)
IgG (Immunoglobin G), Serum: 885 mg/dL (ref 700–1600)
IgM (Immunoglobulin M), Srm: 115 mg/dL (ref 26–217)
M Protein SerPl Elph-Mcnc: 0.5 g/dL — ABNORMAL HIGH
Total Protein ELP: 7 g/dL (ref 6.0–8.5)

## 2017-11-28 ENCOUNTER — Other Ambulatory Visit: Payer: Self-pay | Admitting: *Deleted

## 2017-12-12 ENCOUNTER — Telehealth: Payer: Self-pay

## 2017-12-12 ENCOUNTER — Ambulatory Visit (INDEPENDENT_AMBULATORY_CARE_PROVIDER_SITE_OTHER): Payer: BLUE CROSS/BLUE SHIELD

## 2017-12-12 DIAGNOSIS — I5022 Chronic systolic (congestive) heart failure: Secondary | ICD-10-CM | POA: Diagnosis not present

## 2017-12-12 DIAGNOSIS — Z95810 Presence of automatic (implantable) cardiac defibrillator: Secondary | ICD-10-CM

## 2017-12-12 NOTE — Progress Notes (Signed)
EPIC Encounter for ICM Monitoring  Patient Name: Margaret Hendricks is a 62 y.o. female Date: 12/12/2017 Primary Care Physican: Perrin Maltese, MD Primary Cardiologist: Caryl Comes Electrophysiologist: Caryl Comes Dry Weight:Previous weight 167 lbs Bi-V Pacing: 98.4%          Attempted call to patient and unable to reach.  Left detailed message regarding transmission.  Transmission reviewed.    Thoracic impedance normal.  Prescribed dosage: Furosemide 20 mg 1 tablet daily  Labs: 05/21/2017 Creatinine 0.80, BUN 17, Potassium 4.0, Sodium 135, EGFR >60 12/20/2016 Creatinine 0.77, BUN 19, Potassium 4.3, Sodium 136 07/03/2016 Creatinine 0.80, BUN 15, Potassium 4.4, Sodium 140  Recommendations: Left voice mail with ICM number and encouraged to call if experiencing any fluid symptoms.  Follow-up plan: ICM clinic phone appointment on 01/13/2018.    Copy of ICM check sent to Dr. Caryl Comes.   3 month ICM trend: 12/12/2017    1 Year ICM trend:       [name], RN 12/12/2017 2:30 PM

## 2017-12-12 NOTE — Telephone Encounter (Signed)
Remote ICM transmission received.  Attempted call to patient and left detailed message per DPR regarding transmission and next ICM scheduled for 01/13/2018.  Advised to return call for any fluid symptoms or questions.

## 2017-12-18 IMAGING — CT CT BIOPSY AND ASPIRATION BONE MARROW
2 series · 10 of 14 positions shown, 12 images · non-contrast
Comparison: none

INDICATION: Monoclonal protein disease.

[Series 2: i-spiral 5.0 b30f · axial · 0.82mm/px · z∈[-210,-133]mm · 5 of 34 slices shown, 7 images]
[im 6/34  soft-tissue]
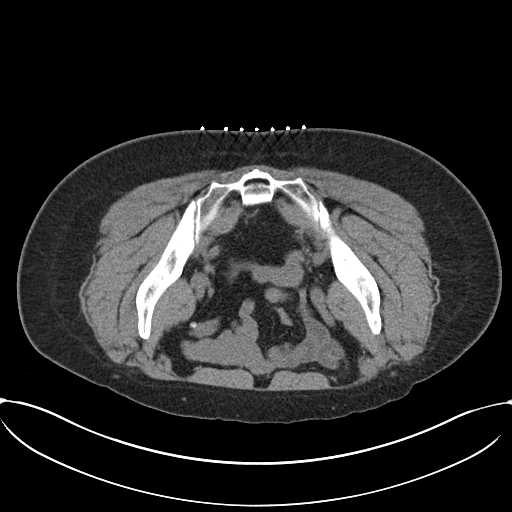
[im 6/34  bone]
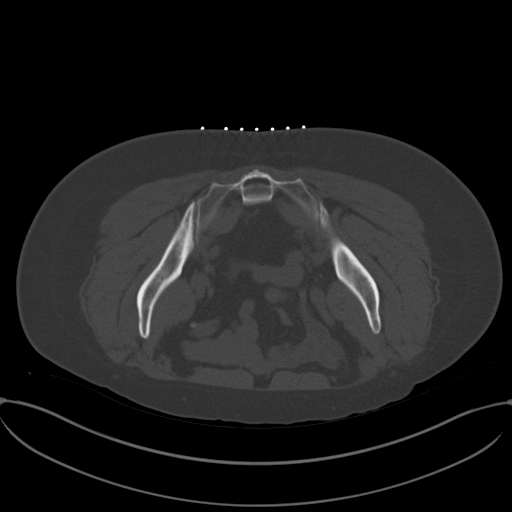
[im 12/34  bone]
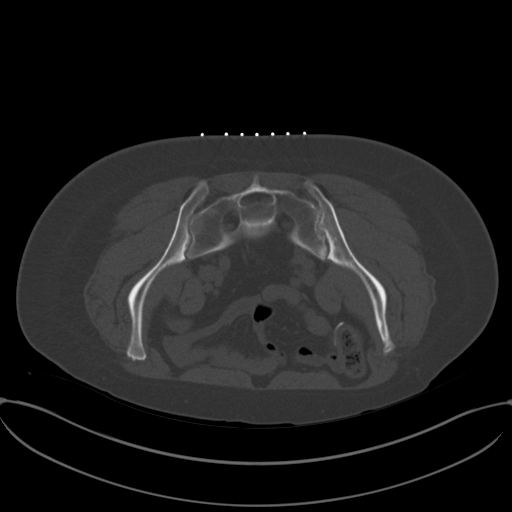
[im 17/34  bone]
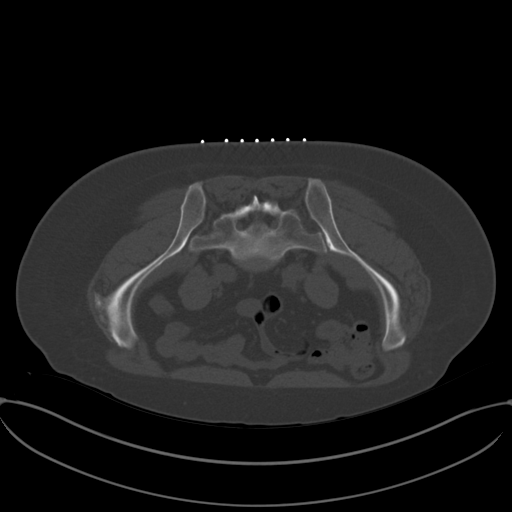
[im 23/34  bone]
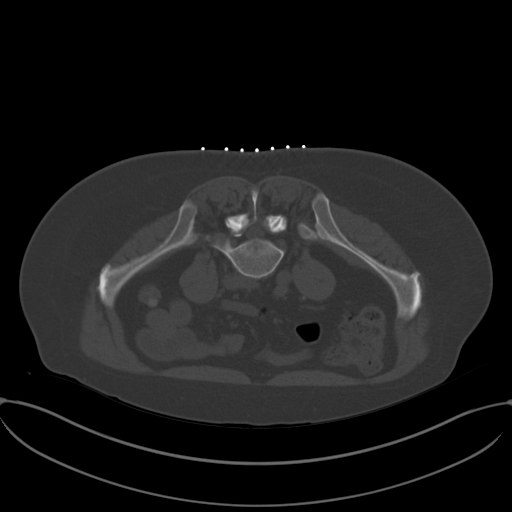
[im 28/34  soft-tissue]
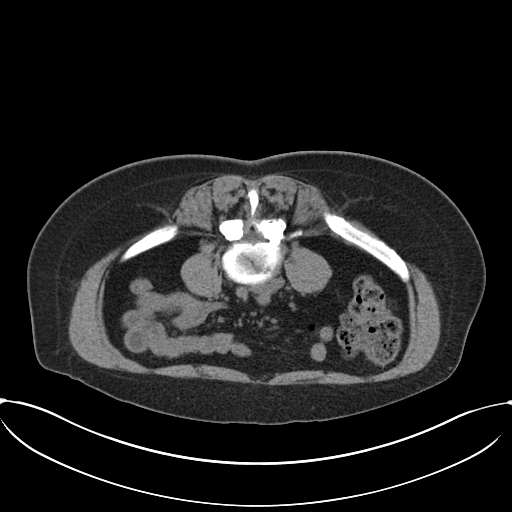
[im 28/34  bone]
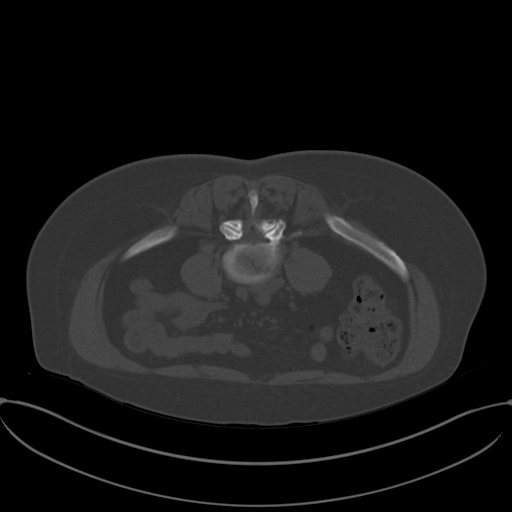

[Series 3: i-sequence 2.4 b30s · axial · 0.82mm/px · z∈[-178,-168]mm · 5 of 30 slices shown]
[im 5/30  bone]
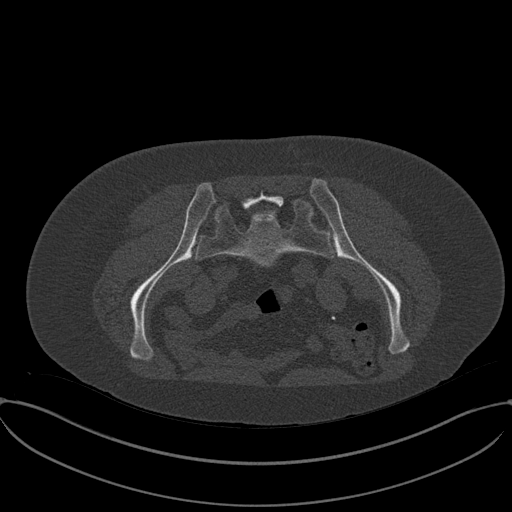
[im 10/30  bone]
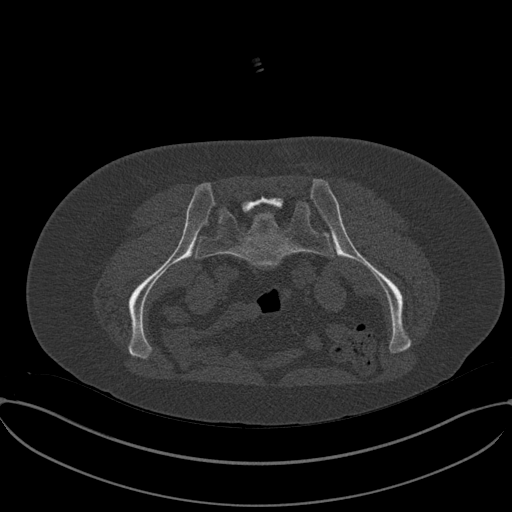
[im 15/30  bone]
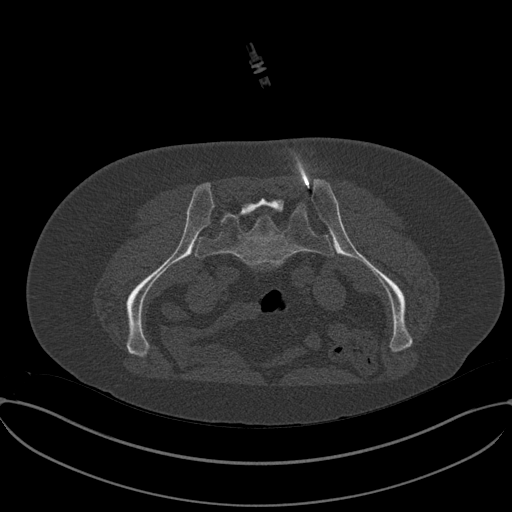
[im 20/30  bone]
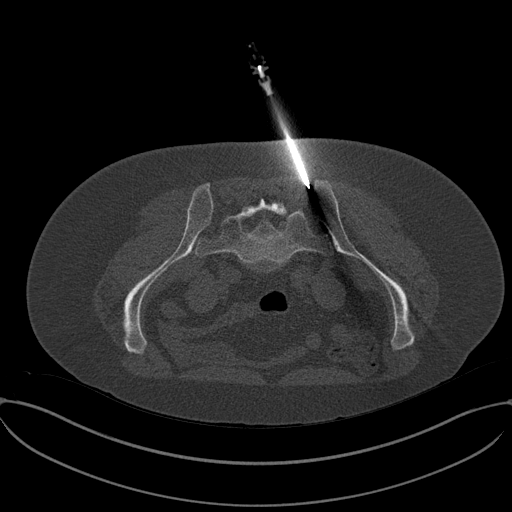
[im 25/30  bone]
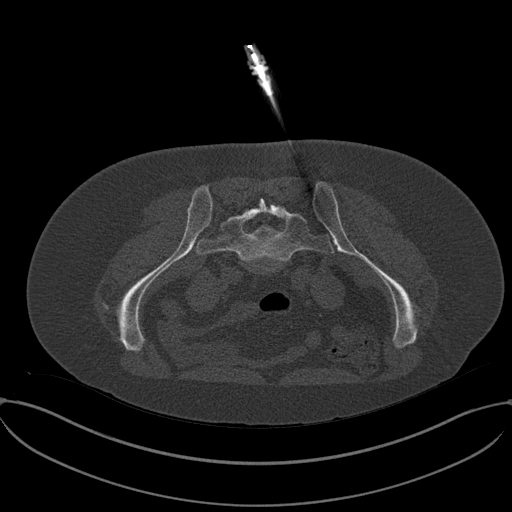

[10 of 14 positions shown; findings below may reference images not displayed]

EXAM:
CT BONE MARROW BIOPSY AND ASPIRATION

MEDICATIONS:
None.

ANESTHESIA/SEDATION:
Fentanyl 100 mcg IV; Versed 1.0 mg IV

Moderate Sedation Time:  15 minutes.

The patient was continuously monitored during the procedure by the
interventional radiology nurse under my direct supervision.

COMPLICATIONS:
None immediate.

PROCEDURE:
Informed written consent was obtained from TIGER, ABIMELK after
a discussion of the risks, benefits and alternatives to treatment. A
timeout was performed prior to the initiation of the procedure.

The patient was placed prone on CT scanner. Posterior flank region
was prepped and draped using sterile technique. Local anesthetic was
applied. Under CT guidance, 22 gauge spinal needle is directed to
the the posterior margin of the right iliac bone. Lidocaine was
injected subperiosteally. Needle was removed. Under CT guidance,
trocar was directed through the posterior margin of the right iliac
bone. Bone marrow aspiration was performed with and without heparin.
This was followed by core biopsy. The samples were delivered to
pathology. Needle was removed and appropriate dressing was applied.
No immediate complications were noted.
IMPRESSION: Successful CT guided bone marrow aspiration and core biopsy of right
iliac bone.

## 2018-01-13 ENCOUNTER — Ambulatory Visit (INDEPENDENT_AMBULATORY_CARE_PROVIDER_SITE_OTHER): Payer: BLUE CROSS/BLUE SHIELD

## 2018-01-13 DIAGNOSIS — Z95810 Presence of automatic (implantable) cardiac defibrillator: Secondary | ICD-10-CM | POA: Diagnosis not present

## 2018-01-13 DIAGNOSIS — I5022 Chronic systolic (congestive) heart failure: Secondary | ICD-10-CM | POA: Diagnosis not present

## 2018-01-14 ENCOUNTER — Telehealth: Payer: Self-pay

## 2018-01-14 NOTE — Telephone Encounter (Signed)
Remote ICM transmission received.  Attempted call to patient and left detailed message per DPR regarding transmission and next ICM scheduled for 02/13/2018.  Advised to return call for any fluid symptoms or questions.    

## 2018-01-14 NOTE — Progress Notes (Signed)
EPIC Encounter for ICM Monitoring  Patient Name: Margaret Hendricks is a 62 y.o. female Date: 01/14/2018 Primary Care Physican: Perrin Maltese, MD Primary Cardiologist: Caryl Comes Electrophysiologist: Faustino Congress Weight:Previous [id number] lbs Bi-V Pacing: 98.5%         Attempted call to patient and unable to reach.  Left detailed message regarding transmission.  Transmission reviewed.    Thoracic impedance normal.  Prescribed dosage: Furosemide 20 mg 1 tablet daily  Labs: 05/21/2017 Creatinine 0.80, BUN 17, Potassium 4.0, Sodium 135, EGFR >60 12/20/2016 Creatinine 0.77, BUN 19, Potassium 4.3, Sodium 136 07/03/2016 Creatinine 0.80, BUN 15, Potassium 4.4, Sodium 140  Recommendations: Left voice mail with ICM number and encouraged to call if experiencing any fluid symptoms.  Follow-up plan: ICM clinic phone appointment on 02/13/2018.   Copy of ICM check sent to Dr. Caryl Comes.   3 month ICM trend: 01/13/2018    1 Year ICM trend:       [name], RN 01/14/2018 3:04 PM

## 2018-02-13 ENCOUNTER — Telehealth: Payer: Self-pay

## 2018-02-13 ENCOUNTER — Ambulatory Visit (INDEPENDENT_AMBULATORY_CARE_PROVIDER_SITE_OTHER): Payer: BLUE CROSS/BLUE SHIELD | Admitting: *Deleted

## 2018-02-13 DIAGNOSIS — I428 Other cardiomyopathies: Secondary | ICD-10-CM | POA: Diagnosis not present

## 2018-02-13 NOTE — Telephone Encounter (Signed)
Remote ICM transmission received.  Attempted call to patient and left detailed message per DPR regarding transmission and next ICM scheduled for 03/17/2018.  Advised to return call for any fluid symptoms or questions.    

## 2018-02-13 NOTE — Progress Notes (Signed)
EPIC Encounter for ICM Monitoring  Patient Name: Margaret Hendricks is a 62 y.o. female Date: 02/13/2018 Primary Care Physican: Perrin Maltese, MD Primary Cardiologist: Caryl Comes Electrophysiologist: Faustino Congress Weight:Previous [id number] lbs Bi-V Pacing: 98.4%       Attempted call to patient and unable to reach.  Left detailed message regarding transmission.  Transmission reviewed.    Thoracic impedance abnormal suggesting fluid accumulation since 02/04/2018 but trending to baseline.  Prescribed dosage: Furosemide 20 mg 1 tablet daily  Labs: 05/21/2017 Creatinine 0.80, BUN 17, Potassium 4.0, Sodium 135, EGFR >60 12/20/2016 Creatinine 0.77, BUN 19, Potassium 4.3, Sodium 136 07/03/2016 Creatinine 0.80, BUN 15, Potassium 4.4, Sodium 140  Recommendations: Left voice mail with ICM number and encouraged to call if experiencing any fluid symptoms.  Follow-up plan: ICM clinic phone appointment on 03/17/2018.    Copy of ICM check sent to Dr. Caryl Comes.   3 month ICM trend: 02/13/2018    1 Year ICM trend:       [name], RN 02/13/2018 9:39 AM

## 2018-02-14 ENCOUNTER — Encounter: Payer: Self-pay | Admitting: Cardiology

## 2018-02-14 NOTE — Progress Notes (Signed)
Remote ICD transmission.   

## 2018-02-19 LAB — CUP PACEART REMOTE DEVICE CHECK
Battery Remaining Longevity: 103 mo
Battery Voltage: 3.01 V
Brady Statistic AP VP Percent: 1.39 %
Brady Statistic RA Percent Paced: 1.44 %
Brady Statistic RV Percent Paced: 1.85 %
Date Time Interrogation Session: 20190418062602
HighPow Impedance: 70 Ohm
Implantable Lead Implant Date: 20170918
Implantable Lead Implant Date: 20170918
Implantable Lead Location: 753859
Implantable Lead Location: 753860
Implantable Lead Model: 5076
Implantable Pulse Generator Implant Date: 20170918
Lead Channel Impedance Value: 1007 Ohm
Lead Channel Impedance Value: 1045 Ohm
Lead Channel Impedance Value: 251.66 Ohm
Lead Channel Impedance Value: 270 Ohm
Lead Channel Impedance Value: 285.934
Lead Channel Impedance Value: 456 Ohm
Lead Channel Impedance Value: 494 Ohm
Lead Channel Impedance Value: 646 Ohm
Lead Channel Impedance Value: 931 Ohm
Lead Channel Impedance Value: 988 Ohm
Lead Channel Pacing Threshold Amplitude: 0.375 V
Lead Channel Pacing Threshold Amplitude: 1 V
Lead Channel Pacing Threshold Pulse Width: 0.4 ms
Lead Channel Pacing Threshold Pulse Width: 0.4 ms
Lead Channel Setting Pacing Amplitude: 1.5 V
Lead Channel Setting Pacing Amplitude: 2 V
Lead Channel Setting Sensing Sensitivity: 0.3 mV
MDC IDC LEAD IMPLANT DT: 20170918
MDC IDC LEAD LOCATION: 753858
MDC IDC MSMT LEADCHNL LV IMPEDANCE VALUE: 1007 Ohm
MDC IDC MSMT LEADCHNL LV IMPEDANCE VALUE: 1045 Ohm
MDC IDC MSMT LEADCHNL LV IMPEDANCE VALUE: 264.643
MDC IDC MSMT LEADCHNL LV IMPEDANCE VALUE: 279.933
MDC IDC MSMT LEADCHNL LV IMPEDANCE VALUE: 513 Ohm
MDC IDC MSMT LEADCHNL LV IMPEDANCE VALUE: 570 Ohm
MDC IDC MSMT LEADCHNL LV PACING THRESHOLD PULSEWIDTH: 0.8 ms
MDC IDC MSMT LEADCHNL RA SENSING INTR AMPL: 3.5 mV
MDC IDC MSMT LEADCHNL RA SENSING INTR AMPL: 3.5 mV
MDC IDC MSMT LEADCHNL RV IMPEDANCE VALUE: 342 Ohm
MDC IDC MSMT LEADCHNL RV IMPEDANCE VALUE: 494 Ohm
MDC IDC MSMT LEADCHNL RV PACING THRESHOLD AMPLITUDE: 0.625 V
MDC IDC MSMT LEADCHNL RV SENSING INTR AMPL: 13.875 mV
MDC IDC MSMT LEADCHNL RV SENSING INTR AMPL: 13.875 mV
MDC IDC SET LEADCHNL LV PACING PULSEWIDTH: 0.8 ms
MDC IDC SET LEADCHNL RV PACING AMPLITUDE: 2.5 V
MDC IDC SET LEADCHNL RV PACING PULSEWIDTH: 0.4 ms
MDC IDC STAT BRADY AP VS PERCENT: 0.05 %
MDC IDC STAT BRADY AS VP PERCENT: 97.14 %
MDC IDC STAT BRADY AS VS PERCENT: 1.41 %

## 2018-03-17 ENCOUNTER — Ambulatory Visit (INDEPENDENT_AMBULATORY_CARE_PROVIDER_SITE_OTHER): Payer: BLUE CROSS/BLUE SHIELD

## 2018-03-17 ENCOUNTER — Telehealth: Payer: Self-pay

## 2018-03-17 DIAGNOSIS — Z95810 Presence of automatic (implantable) cardiac defibrillator: Secondary | ICD-10-CM | POA: Diagnosis not present

## 2018-03-17 DIAGNOSIS — I5022 Chronic systolic (congestive) heart failure: Secondary | ICD-10-CM | POA: Diagnosis not present

## 2018-03-17 NOTE — Telephone Encounter (Signed)
LMOVM reminding pt to send remote transmission.   

## 2018-03-18 ENCOUNTER — Telehealth: Payer: Self-pay

## 2018-03-18 NOTE — Telephone Encounter (Signed)
Remote ICM transmission received.  Attempted call to patient and left detailed message, per DPR, regarding transmission and next ICM scheduled for 04/17/2018.  Advised to return call for any fluid symptoms or questions.    

## 2018-03-18 NOTE — Progress Notes (Signed)
EPIC Encounter for ICM Monitoring  Patient Name: Margaret Hendricks is a 62 y.o. female Date: 03/18/2018 Primary Care Physican: Perrin Maltese, MD Primary Cardiologist: Caryl Comes Electrophysiologist: Faustino Congress Weight:Previous [id number] lbs Bi-V Pacing: 98.5%                                               Attempted call to patient and unable to reach.  Left detailed message, per DPR, regarding transmission.  Transmission reviewed.    Thoracic impedance close to baseline normal.  Prescribed dosage: Furosemide 20 mg 1 tablet daily  Labs: 05/21/2017 Creatinine 0.80, BUN 17, Potassium 4.0, Sodium 135, EGFR >60 12/20/2016 Creatinine 0.77, BUN 19, Potassium 4.3, Sodium 136 07/03/2016 Creatinine 0.80, BUN 15, Potassium 4.4, Sodium 140  Recommendations: Left voice mail with ICM number and encouraged to call if experiencing any fluid symptoms.  Follow-up plan: ICM clinic phone appointment on 04/17/2018.    Copy of ICM check sent to Dr. Caryl Comes.   3 month ICM trend: 03/18/2018    1 Year ICM trend:       [name], RN 03/18/2018 3:44 PM

## 2018-04-17 ENCOUNTER — Telehealth: Payer: Self-pay | Admitting: Cardiology

## 2018-04-17 NOTE — Telephone Encounter (Signed)
LMOVM reminding pt to send remote transmission.   

## 2018-04-25 NOTE — Progress Notes (Signed)
No ICM remote transmission received for 04/17/2018 and next ICM transmission scheduled for 05/15/2018.

## 2018-05-09 ENCOUNTER — Encounter: Payer: Self-pay | Admitting: *Deleted

## 2018-05-12 ENCOUNTER — Encounter
Admission: RE | Disposition: A | Discharge status: Home / Self Care | Payer: Self-pay | Source: Ambulatory Visit | Attending: Gastroenterology

## 2018-05-12 ENCOUNTER — Encounter: Payer: Self-pay | Admitting: *Deleted

## 2018-05-12 ENCOUNTER — Ambulatory Visit: Payer: BLUE CROSS/BLUE SHIELD | Admitting: Certified Registered"

## 2018-05-12 ENCOUNTER — Ambulatory Visit
Admission: RE | Admit: 2018-05-12 | Discharge: 2018-05-12 | Disposition: A | Discharge status: Home / Self Care | Payer: BLUE CROSS/BLUE SHIELD | Source: Ambulatory Visit | Attending: Gastroenterology | Admitting: Gastroenterology

## 2018-05-12 DIAGNOSIS — D125 Benign neoplasm of sigmoid colon: Secondary | ICD-10-CM | POA: Diagnosis not present

## 2018-05-12 DIAGNOSIS — D509 Iron deficiency anemia, unspecified: Secondary | ICD-10-CM | POA: Diagnosis not present

## 2018-05-12 DIAGNOSIS — Z86010 Personal history of colonic polyps: Secondary | ICD-10-CM | POA: Diagnosis not present

## 2018-05-12 DIAGNOSIS — K2950 Unspecified chronic gastritis without bleeding: Secondary | ICD-10-CM | POA: Diagnosis not present

## 2018-05-12 DIAGNOSIS — K449 Diaphragmatic hernia without obstruction or gangrene: Secondary | ICD-10-CM | POA: Insufficient documentation

## 2018-05-12 DIAGNOSIS — D127 Benign neoplasm of rectosigmoid junction: Secondary | ICD-10-CM | POA: Diagnosis not present

## 2018-05-12 DIAGNOSIS — D124 Benign neoplasm of descending colon: Secondary | ICD-10-CM | POA: Diagnosis not present

## 2018-05-12 DIAGNOSIS — I5022 Chronic systolic (congestive) heart failure: Secondary | ICD-10-CM | POA: Diagnosis not present

## 2018-05-12 DIAGNOSIS — K3189 Other diseases of stomach and duodenum: Secondary | ICD-10-CM | POA: Diagnosis not present

## 2018-05-12 DIAGNOSIS — I428 Other cardiomyopathies: Secondary | ICD-10-CM | POA: Insufficient documentation

## 2018-05-12 DIAGNOSIS — Z888 Allergy status to other drugs, medicaments and biological substances status: Secondary | ICD-10-CM | POA: Insufficient documentation

## 2018-05-12 DIAGNOSIS — K5730 Diverticulosis of large intestine without perforation or abscess without bleeding: Secondary | ICD-10-CM | POA: Insufficient documentation

## 2018-05-12 DIAGNOSIS — Z95810 Presence of automatic (implantable) cardiac defibrillator: Secondary | ICD-10-CM | POA: Diagnosis not present

## 2018-05-12 DIAGNOSIS — J449 Chronic obstructive pulmonary disease, unspecified: Secondary | ICD-10-CM | POA: Insufficient documentation

## 2018-05-12 DIAGNOSIS — I447 Left bundle-branch block, unspecified: Secondary | ICD-10-CM | POA: Diagnosis not present

## 2018-05-12 DIAGNOSIS — E119 Type 2 diabetes mellitus without complications: Secondary | ICD-10-CM | POA: Diagnosis not present

## 2018-05-12 DIAGNOSIS — Z79899 Other long term (current) drug therapy: Secondary | ICD-10-CM | POA: Insufficient documentation

## 2018-05-12 DIAGNOSIS — Z7984 Long term (current) use of oral hypoglycemic drugs: Secondary | ICD-10-CM | POA: Insufficient documentation

## 2018-05-12 DIAGNOSIS — Z85828 Personal history of other malignant neoplasm of skin: Secondary | ICD-10-CM | POA: Diagnosis not present

## 2018-05-12 HISTORY — PX: ESOPHAGOGASTRODUODENOSCOPY (EGD) WITH PROPOFOL: SHX5813

## 2018-05-12 HISTORY — DX: Personal history of colonic polyps: Z86.010

## 2018-05-12 HISTORY — DX: Personal history of colon polyps, unspecified: Z86.0100

## 2018-05-12 HISTORY — PX: COLONOSCOPY WITH PROPOFOL: SHX5780

## 2018-05-12 LAB — GLUCOSE, CAPILLARY: Glucose-Capillary: 105 mg/dL — ABNORMAL HIGH (ref 70–99)

## 2018-05-12 SURGERY — COLONOSCOPY WITH PROPOFOL
Anesthesia: General

## 2018-05-12 MED ORDER — EPHEDRINE SULFATE 50 MG/ML IJ SOLN
INTRAMUSCULAR | Status: DC | PRN
  Administered 2018-05-12 (×4): 50 mg via INTRAVENOUS

## 2018-05-12 MED ORDER — FENTANYL CITRATE (PF) 100 MCG/2ML IJ SOLN
INTRAMUSCULAR | Status: DC | PRN
  Administered 2018-05-12: 25 ug via INTRAVENOUS
  Administered 2018-05-12: 50 ug via INTRAVENOUS
  Administered 2018-05-12: 25 ug via INTRAVENOUS

## 2018-05-12 MED ORDER — PROPOFOL 10 MG/ML IV BOLUS
INTRAVENOUS | Status: AC
  Filled 2018-05-12: qty 40

## 2018-05-12 MED ORDER — PROPOFOL 10 MG/ML IV BOLUS
INTRAVENOUS | Status: AC
  Filled 2018-05-12: qty 20

## 2018-05-12 MED ORDER — FENTANYL CITRATE (PF) 100 MCG/2ML IJ SOLN
INTRAMUSCULAR | Status: AC
  Filled 2018-05-12: qty 2

## 2018-05-12 MED ORDER — SODIUM CHLORIDE 0.9 % IV SOLN: INTRAVENOUS | Status: DC

## 2018-05-12 MED ORDER — LIDOCAINE HCL (CARDIAC) PF 100 MG/5ML IV SOSY
PREFILLED_SYRINGE | INTRAVENOUS | Status: DC | PRN
  Administered 2018-05-12: 80 mg via INTRAVENOUS

## 2018-05-12 MED ORDER — PROPOFOL 500 MG/50ML IV EMUL
INTRAVENOUS | Status: DC | PRN
  Administered 2018-05-12: 100 ug/kg/min via INTRAVENOUS

## 2018-05-12 MED ORDER — SODIUM CHLORIDE 0.9 % IV SOLN
INTRAVENOUS | Status: DC
  Administered 2018-05-12: 1000 mL via INTRAVENOUS

## 2018-05-12 NOTE — Op Note (Signed)
[facility] Gastroenterology Patient Name: Margaret Hendricks Procedure Date: 05/12/2018 7:24 AM MRN: [id number] Account #: [id number] Date of Birth: 05/01/1956 Admit Type: Outpatient Age: 62 Room: [facility] ENDO ROOM 1 Gender: Female Note Status: Finalized Procedure:            Upper GI endoscopy Indications:          Iron deficiency anemia, Heme positive stool Providers:            Lollie Sails, MD Referring MD:         Perrin Maltese, MD (Referring MD) Medicines:            Monitored Anesthesia Care Complications:        No immediate complications. Procedure:            Pre-Anesthesia Assessment:                       - ASA Grade Assessment: III - A patient with severe                        systemic disease.                       After obtaining informed consent, the endoscope was                        passed under direct vision. Throughout the procedure,                        the patient's blood pressure, pulse, and oxygen                        saturations were monitored continuously. The Endoscope                        was introduced through the mouth, and advanced to the                        third part of duodenum. The upper GI endoscopy was                        accomplished without difficulty. The patient tolerated                        the procedure well. Findings:      LA Grade C (one or more mucosal breaks continuous between tops of 2 or       more mucosal folds, less than 75% circumference) esophagitis with       bleeding was found. Biopsies were taken with a cold forceps for       histology.      A small hiatal hernia was present.      The exam of the esophagus was otherwise normal.      Patchy mild inflammation characterized by congestion (edema), erosions,       erythema and linear erosions was found in the gastric body and in the       gastric antrum. Biopsies were taken with a cold forceps for histology.       Biopsies were taken with  a cold forceps for Helicobacter pylori testing.      The cardia and gastric fundus were normal on retroflexion.  Patchy mild inflammation characterized by congestion (edema) and       erythema was found in the duodenal bulb and in the second portion of the       duodenum.      A single less than 1 mm sessile polyp with no bleeding was found in the       duodenal bulb. The polyp was removed with a cold biopsy forceps.       Resection and retrieval were complete. Impression:           - LA Grade C erosive esophagitis. Biopsied.                       - Small hiatal hernia. Recommendation:       - Await pathology results.                       - Use Protonix (pantoprazole) 40 mg PO BID for 2 weeks.                       - Use Protonix (pantoprazole) 40 mg PO daily daily. Procedure Code(s):    --- Professional ---                       [phone], Esophagogastroduodenoscopy, flexible, transoral;                        with biopsy, single or multiple Diagnosis Code(s):    --- Professional ---                       K20.8, Other esophagitis                       K44.9, Diaphragmatic hernia without obstruction or                        gangrene                       D50.9, Iron deficiency anemia, unspecified                       R19.5, Other fecal abnormalities CPT copyright 2017 American Medical Association. All rights reserved. The codes documented in this report are preliminary and upon coder review may  be revised to meet current compliance requirements. Lollie Sails, MD 05/12/2018 7:54:36 AM This report has been signed electronically. Number of Addenda: 0 Note Initiated On: 05/12/2018 7:24 AM      [facility]

## 2018-05-12 NOTE — Anesthesia Preprocedure Evaluation (Addendum)
Anesthesia Evaluation  Patient identified by MRN, date of birth, ID band Patient awake    Reviewed: Allergy & Precautions, H&P , NPO status , Patient's Chart, lab work & pertinent test results, reviewed documented beta blocker date and time   History of Anesthesia Complications Negative for: history of anesthetic complications  Airway Mallampati: II  TM Distance: >3 FB Neck ROM: full    Dental  (+) Missing, Dental Advidsory Given   Pulmonary shortness of breath and with exertion, neg sleep apnea, COPD, neg recent URI, Current Smoker,           Cardiovascular Exercise Tolerance: Good (-) hypertension(-) angina+CHF  (-) CAD, (-) Past MI, (-) Cardiac Stents and (-) CABG + dysrhythmias (LBBB) + pacemaker + Cardiac Defibrillator (-) Valvular Problems/Murmurs     Neuro/Psych negative neurological ROS  negative psych ROS   GI/Hepatic Neg liver ROS, GERD  ,  Endo/Other  diabetes (borderline)  Renal/GU negative Renal ROS  negative genitourinary   Musculoskeletal   Abdominal   Peds  Hematology negative hematology ROS (+)   Anesthesia Other Findings Past Medical History: No date: Abnormal heart rhythm No date: AICD (automatic cardioverter/defibrillator) present No date: Anemia No date: Cancer [facility])     Comment:  skin 7/0/3500: Chronic systolic CHF (congestive heart failure), NYHA class  2- 3 ([facility]) No date: COPD (chronic obstructive pulmonary disease) ([facility]) No date: Diabetes mellitus without complication ([place]) No date: History of colonic polyps 07/03/2016: LBBB (left bundle branch block) 07/03/2016: NICM (nonischemic cardiomyopathy) ([facility]) No date: Shortness of breath   Reproductive/Obstetrics negative OB ROS                             Anesthesia Physical Anesthesia Plan  ASA: III  Anesthesia Plan: General   Post-op Pain Management:    Induction: Intravenous  PONV Risk Score and Plan: 2  and Propofol infusion and TIVA  Airway Management Planned: Nasal Cannula  Additional Equipment:   Intra-op Plan:   Post-operative Plan:   Informed Consent: I have reviewed the patients History and Physical, chart, labs and discussed the procedure including the risks, benefits and alternatives for the proposed anesthesia with the patient or authorized representative who has indicated his/her understanding and acceptance.   Dental Advisory Given  Plan Discussed with: Anesthesiologist, CRNA and Surgeon  Anesthesia Plan Comments:         Anesthesia Quick Evaluation

## 2018-05-12 NOTE — Anesthesia Post-op Follow-up Note (Signed)
Anesthesia QCDR form completed.        

## 2018-05-12 NOTE — Transfer of Care (Signed)
Immediate Anesthesia Transfer of Care Note  Patient: Margaret Hendricks  Procedure(s) Performed: COLONOSCOPY WITH PROPOFOL (N/A ) ESOPHAGOGASTRODUODENOSCOPY (EGD) WITH PROPOFOL (N/A )  Patient Location: PACU  Anesthesia Type:General  Level of Consciousness: awake, alert  and oriented  Airway & Oxygen Therapy: Patient Spontanous Breathing and Patient connected to nasal cannula oxygen  Post-op Assessment: Report given to RN and Post -op Vital signs reviewed and stable  Post vital signs: Reviewed and stable  Last Vitals:  Vitals Value Taken Time  BP 110/57 05/12/2018  8:25 AM  Temp    Pulse 71 05/12/2018  8:25 AM  Resp 16 05/12/2018  8:25 AM  SpO2 96 % 05/12/2018  8:25 AM  Vitals shown include unvalidated device data.  Last Pain:  Vitals:   05/12/18 0647  TempSrc: Tympanic         Complications: No apparent anesthesia complications

## 2018-05-12 NOTE — H&P (Signed)
Outpatient short stay form Pre-procedure 05/12/2018 7:22 AM Lollie Sails MD  Primary Physician: Neoma Laming, MD  Reason for visit: EGD and colonoscopy  History of present illness: Patient is a 62 year old female presenting today as above.  Been found to have a positive Hemoccult and some amount of anemia.  She states that she has had long history of anemia.  Tolerating her prep well.  She takes no aspirin or blood thinning agent.  Does occasionally take ibuprofen states perhaps once a month.  Is not on a proton pump inhibitor.  She does take oral iron    Current Facility-Administered Medications:  .  0.9 %  sodium chloride infusion, , Intravenous, Continuous, Lollie Sails, MD, Last Rate: 20 mL/hr at 05/12/18 0657 .  0.9 %  sodium chloride infusion, , Intravenous, Continuous, Lollie Sails, MD  Medications Prior to Admission  Medication Sig Dispense Refill Last Dose  . albuterol (PROVENTIL HFA;VENTOLIN HFA) 108 (90 BASE) MCG/ACT inhaler Inhale 2 puffs into the lungs every 6 (six) hours as needed for wheezing.   Past Week at Unknown time  . calcium carbonate (OSCAL) 1500 (600 Ca) MG TABS tablet Take by mouth 2 (two) times daily with a meal.   Past Week at Unknown time  . carvedilol (COREG) 25 MG tablet Take 25 mg by mouth 2 (two) times daily with a meal.   05/11/2018 at Unknown time  . cholecalciferol (VITAMIN D) 1000 units tablet Take 1,000 Units by mouth daily.   Past Week at Unknown time  . cyclobenzaprine (FLEXERIL) 5 MG tablet Take 5 mg by mouth at bedtime.   Past Week at Unknown time  . diazepam (VALIUM) 5 MG tablet Take 5 mg by mouth every 6 (six) hours as needed for anxiety.   Past Week at Unknown time  . ferrous sulfate 325 (65 FE) MG tablet Take 325 mg by mouth 2 (two) times daily with a meal.   Past Week at Unknown time  . ibuprofen (ADVIL,MOTRIN) 200 MG tablet Take 600 mg by mouth every 6 (six) hours as needed for mild pain.   Past Week at Unknown time  .  Ipratropium-Albuterol (COMBIVENT RESPIMAT IN) Inhale 2 Inhalers into the lungs 4 (four) times daily.   Past Week at Unknown time  . lidocaine (LIDODERM) 5 % Place 1 patch onto the skin every 12 (twelve) hours. Remove & Discard patch within 12 hours or as directed by MD   Past Week at Unknown time  . Menthol, Topical Analgesic, (BENGAY EX) Apply 1 application topically daily as needed. Back pain   Past Week at Unknown time  . metFORMIN (GLUCOPHAGE) 500 MG tablet Take 500 mg by mouth 2 (two) times daily with a meal.   Past Week at Unknown time  . Multiple Vitamin (MULTIVITAMIN WITH MINERALS) TABS tablet Take 1 tablet by mouth daily.   Past Week at Unknown time  . Multiple Vitamins-Minerals (ICAPS AREDS FORMULA PO) Take 1 tablet by mouth daily.   Past Week at Unknown time  . polyethylene glycol-electrolytes (NULYTELY/GOLYTELY) 420 g solution Take 4,000 mLs by mouth once.   Past Week at Unknown time  . sacubitril-valsartan (ENTRESTO) 97-103 MG Take 1 tablet by mouth 2 (two) times daily.   05/11/2018 at Unknown time  . spironolactone (ALDACTONE) 25 MG tablet Take 25 mg by mouth daily.   05/11/2018 at Unknown time  . traMADol (ULTRAM) 50 MG tablet Take 1 tablet (50 mg total) by mouth every 6 (six) hours as needed.  12 tablet 0 Past Week at Unknown time  . furosemide (LASIX) 20 MG tablet Take 20 mg by mouth daily.   Taking     Allergies  Allergen Reactions  . Tape Other (See Comments)    Adhesive-silicones     Past Medical History:  Diagnosis Date  . Abnormal heart rhythm   . AICD (automatic cardioverter/defibrillator) present   . Anemia   . Cancer ([place])    skin  . Chronic systolic CHF (congestive heart failure), NYHA class 2- 3 ([place]) 07/03/2016  . COPD (chronic obstructive pulmonary disease) ([place])   . Diabetes mellitus without complication ([place])   . History of colonic polyps   . LBBB (left bundle branch block) 07/03/2016  . NICM (nonischemic cardiomyopathy) ([place]) 07/03/2016  . Shortness of breath      Review of systems:      Physical Exam    Heart and lungs: Regular rate and rhythm without rub or gallop, lungs are bilaterally clear    HEENT: Normocephalic atraumatic eyes are anicteric    Other:    Pertinant exam for procedure: Soft nontender nondistended bowel sounds positive normoactive    Planned proceedures: EGD, colonoscopy and indicated procedures. I have discussed the risks benefits and complications of procedures to include not limited to bleeding, infection, perforation and the risk of sedation and the patient wishes to proceed.    Lollie Sails, MD Gastroenterology 05/12/2018  7:22 AM

## 2018-05-12 NOTE — Op Note (Signed)
[facility] Gastroenterology Patient Name: Margaret Hendricks Procedure Date: 05/12/2018 7:23 AM MRN: [id number] Account #: [id number] Date of Birth: [birth year] Admit Type: Outpatient Age: 62 Room: [facility] ENDO ROOM 1 Gender: Female Note Status: Finalized Procedure:            Colonoscopy Indications:          Heme positive stool, Iron deficiency anemia Providers:            Lollie Sails, MD Medicines:            Monitored Anesthesia Care Complications:        No immediate complications. Procedure:            Pre-Anesthesia Assessment:                       - ASA Grade Assessment: III - A patient with severe                        systemic disease.                       After obtaining informed consent, the colonoscope was                        passed under direct vision. Throughout the procedure,                        the patient's blood pressure, pulse, and oxygen                        saturations were monitored continuously. The                        Colonoscope was introduced through the anus and                        advanced to the the cecum, identified by appendiceal                        orifice and ileocecal valve. The colonoscopy was                        performed without difficulty. The patient tolerated the                        procedure well. The quality of the bowel preparation                        was good. Findings:      A few small-mouthed diverticula were found in the sigmoid colon and       descending colon.      A 2 mm polyp was found in the descending colon. The polyp was sessile.       The polyp was removed with a cold biopsy forceps. Resection and       retrieval were complete.      A 2 mm polyp was found in the proximal sigmoid colon. The polyp was       sessile. The polyp was removed with a cold biopsy forceps. Resection and       retrieval were complete.      A 4 mm polyp was  found in the recto-sigmoid colon. The polyp  was       sessile. The polyp was removed with a piecemeal technique using a cold       biopsy forceps. Resection and retrieval were complete.      The retroflexed view of the distal rectum and anal verge was normal and       showed no anal or rectal abnormalities.      The exam was otherwise without abnormality.      The digital rectal exam was normal.      The terminal ileum appeared normal. Impression:           - Diverticulosis in the sigmoid colon and in the                        descending colon.                       - One 2 mm polyp in the descending colon, removed with                        a cold biopsy forceps. Resected and retrieved.                       - One 2 mm polyp in the proximal sigmoid colon, removed                        with a cold biopsy forceps. Resected and retrieved.                       - One 4 mm polyp at the recto-sigmoid colon, removed                        piecemeal using a cold biopsy forceps. Resected and                        retrieved.                       - The distal rectum and anal verge are normal on                        retroflexion view.                       - The examination was otherwise normal. Recommendation:       - Discharge patient to home. Procedure Code(s):    --- Professional ---                       [id number], Colonoscopy, flexible; with biopsy, single or                        multiple Diagnosis Code(s):    --- Professional ---                       D12.4, Benign neoplasm of descending colon                       D12.5, Benign neoplasm of sigmoid colon  D12.7, Benign neoplasm of rectosigmoid junction                       R19.5, Other fecal abnormalities                       D50.9, Iron deficiency anemia, unspecified                       K57.30, Diverticulosis of large intestine without                        perforation or abscess without bleeding CPT copyright 2017 American Medical Association. All  rights reserved. The codes documented in this report are preliminary and upon coder review may  be revised to meet current compliance requirements. Lollie Sails, MD 05/12/2018 8:24:36 AM This report has been signed electronically. Number of Addenda: 0 Note Initiated On: 05/12/2018 7:23 AM Scope Withdrawal Time: 0 hours 17 minutes 19 seconds  Total Procedure Duration: 0 hours 21 minutes 56 seconds       [facility]

## 2018-05-12 NOTE — Anesthesia Postprocedure Evaluation (Signed)
Anesthesia Post Note  Patient: Margaret Hendricks  Procedure(s) Performed: COLONOSCOPY WITH PROPOFOL (N/A ) ESOPHAGOGASTRODUODENOSCOPY (EGD) WITH PROPOFOL (N/A )  Patient location during evaluation: Endoscopy Anesthesia Type: General Level of consciousness: awake and alert Pain management: pain level controlled Vital Signs Assessment: post-procedure vital signs reviewed and stable Respiratory status: spontaneous breathing, nonlabored ventilation, respiratory function stable and patient connected to nasal cannula oxygen Cardiovascular status: blood pressure returned to baseline and stable Postop Assessment: no apparent nausea or vomiting Anesthetic complications: no     Last Vitals:  Vitals:   05/12/18 0845 05/12/18 0855  BP: 114/63 100/61  Pulse: (!) 52 (!) 50  Resp: 13 13  Temp:    SpO2: 100% 100%    Last Pain:  Vitals:   05/12/18 0855  TempSrc:   PainSc: 0-No pain                 Martha Clan

## 2018-05-13 ENCOUNTER — Encounter: Payer: Self-pay | Admitting: Gastroenterology

## 2018-05-14 LAB — SURGICAL PATHOLOGY

## 2018-05-15 ENCOUNTER — Telehealth: Payer: Self-pay

## 2018-05-15 ENCOUNTER — Ambulatory Visit (INDEPENDENT_AMBULATORY_CARE_PROVIDER_SITE_OTHER): Payer: BLUE CROSS/BLUE SHIELD

## 2018-05-15 ENCOUNTER — Ambulatory Visit (INDEPENDENT_AMBULATORY_CARE_PROVIDER_SITE_OTHER): Payer: BLUE CROSS/BLUE SHIELD | Admitting: *Deleted

## 2018-05-15 DIAGNOSIS — Z95810 Presence of automatic (implantable) cardiac defibrillator: Secondary | ICD-10-CM | POA: Diagnosis not present

## 2018-05-15 DIAGNOSIS — I5022 Chronic systolic (congestive) heart failure: Secondary | ICD-10-CM

## 2018-05-15 DIAGNOSIS — I428 Other cardiomyopathies: Secondary | ICD-10-CM

## 2018-05-15 NOTE — Telephone Encounter (Signed)
LMOVM reminding pt to send remote transmission.   

## 2018-05-16 ENCOUNTER — Encounter: Payer: Self-pay | Admitting: Cardiology

## 2018-05-16 ENCOUNTER — Telehealth: Payer: Self-pay

## 2018-05-16 NOTE — Telephone Encounter (Signed)
Remote ICM transmission received.  Attempted call to patient and left message to return call. 

## 2018-05-16 NOTE — Progress Notes (Signed)
Remote ICD transmission.   

## 2018-05-16 NOTE — Progress Notes (Signed)
EPIC Encounter for ICM Monitoring  Patient Name: Margaret Hendricks is a 62 y.o. female Date: 05/16/2018 Primary Care Physican: Perrin Maltese, MD Primary Cardiologist: Caryl Comes Electrophysiologist: Faustino Congress Weight:Previous [id number] lbs Bi-V Pacing: 98.5%        Attempted call to patient and unable to reach.  Left message to return call.  Transmission reviewed.    Thoracic impedance normal.  Prescribed dosage: Furosemide 20 mg 1 tablet daily  Labs: 05/21/2017 Creatinine 0.80, BUN 17, Potassium 4.0, Sodium 135, EGFR >60 12/20/2016 Creatinine 0.77, BUN 19, Potassium 4.3, Sodium 136  Recommendations: NONE - Unable to reach.  Follow-up plan: ICM clinic phone appointment on 06/16/2018.    Copy of ICM check sent to Dr. Caryl Comes.   3 month ICM trend: 05/16/2018    1 Year ICM trend:       [name], RN 05/16/2018 8:01 AM

## 2018-05-21 ENCOUNTER — Other Ambulatory Visit: Payer: Self-pay

## 2018-05-21 ENCOUNTER — Inpatient Hospital Stay: Payer: BLUE CROSS/BLUE SHIELD | Attending: Oncology

## 2018-05-21 DIAGNOSIS — D472 Monoclonal gammopathy: Secondary | ICD-10-CM | POA: Insufficient documentation

## 2018-05-21 LAB — CBC WITH DIFFERENTIAL/PLATELET
BASOS ABS: 0 10*3/uL (ref 0–0.1)
BASOS PCT: 0 %
EOS ABS: 0.1 10*3/uL (ref 0–0.7)
Eosinophils Relative: 2 %
HEMATOCRIT: 33.5 % — AB (ref 35.0–47.0)
Hemoglobin: 11.4 g/dL — ABNORMAL LOW (ref 12.0–16.0)
Lymphocytes Relative: 39 %
Lymphs Abs: 2.6 10*3/uL (ref 1.0–3.6)
MCH: 31.1 pg (ref 26.0–34.0)
MCHC: 34.2 g/dL (ref 32.0–36.0)
MCV: 91.1 fL (ref 80.0–100.0)
MONO ABS: 0.4 10*3/uL (ref 0.2–0.9)
Monocytes Relative: 6 %
NEUTROS ABS: 3.6 10*3/uL (ref 1.4–6.5)
Neutrophils Relative %: 53 %
Platelets: 281 10*3/uL (ref 150–440)
RBC: 3.68 MIL/uL — AB (ref 3.80–5.20)
RDW: 14.6 % — ABNORMAL HIGH (ref 11.5–14.5)
WBC: 6.8 10*3/uL (ref 3.6–11.0)

## 2018-05-21 LAB — COMPREHENSIVE METABOLIC PANEL
ALBUMIN: 4.2 g/dL (ref 3.5–5.0)
ALK PHOS: 59 U/L (ref 38–126)
ALT: 16 U/L (ref 0–44)
ANION GAP: 11 (ref 5–15)
AST: 19 U/L (ref 15–41)
BUN: 17 mg/dL (ref 6–20)
CALCIUM: 9.4 mg/dL (ref 8.9–10.3)
CO2: 25 mmol/L (ref 22–32)
Chloride: 102 mmol/L (ref 98–111)
Creatinine, Ser: 0.87 mg/dL (ref 0.44–1.00)
GFR calc Af Amer: >60 mL/min (ref >60)
GFR calc non Af Amer: >60 mL/min (ref >60)
GLUCOSE: 123 mg/dL — AB (ref 70–99)
Potassium: 4 mmol/L (ref 3.5–5.1)
SODIUM: 138 mmol/L (ref 135–145)
Total Bilirubin: 0.3 mg/dL (ref 0.3–1.2)
Total Protein: 7.7 g/dL (ref 6.5–8.1)

## 2018-05-22 LAB — CUP PACEART REMOTE DEVICE CHECK
Battery Remaining Longevity: 92 mo
Battery Voltage: 2.99 V
Brady Statistic AP VP Percent: 1.07 %
Brady Statistic AS VS Percent: 1.4 %
Brady Statistic RA Percent Paced: 1.11 %
HighPow Impedance: 75 Ohm
Implantable Lead Implant Date: 20170918
Implantable Lead Implant Date: 20170918
Implantable Lead Location: 753858
Implantable Lead Location: 753859
Implantable Lead Model: 5076
Implantable Pulse Generator Implant Date: 20170918
Lead Channel Impedance Value: 1007 Ohm
Lead Channel Impedance Value: 1007 Ohm
Lead Channel Impedance Value: 1064 Ohm
Lead Channel Impedance Value: 1064 Ohm
Lead Channel Impedance Value: 275.5 Ohm
Lead Channel Impedance Value: 297.365
Lead Channel Impedance Value: 297.365
Lead Channel Impedance Value: 456 Ohm
Lead Channel Impedance Value: 513 Ohm
Lead Channel Impedance Value: 551 Ohm
Lead Channel Impedance Value: 665 Ohm
Lead Channel Pacing Threshold Amplitude: 0.5 V
Lead Channel Pacing Threshold Amplitude: 1.375 V
Lead Channel Pacing Threshold Pulse Width: 0.4 ms
Lead Channel Pacing Threshold Pulse Width: 0.4 ms
Lead Channel Pacing Threshold Pulse Width: 0.8 ms
Lead Channel Sensing Intrinsic Amplitude: 13 mV
Lead Channel Sensing Intrinsic Amplitude: 2.875 mV
Lead Channel Sensing Intrinsic Amplitude: 2.875 mV
Lead Channel Setting Pacing Amplitude: 2 V
Lead Channel Setting Pacing Pulse Width: 0.4 ms
Lead Channel Setting Sensing Sensitivity: 0.3 mV
MDC IDC LEAD IMPLANT DT: 20170918
MDC IDC LEAD LOCATION: 753860
MDC IDC MSMT LEADCHNL LV IMPEDANCE VALUE: 1007 Ohm
MDC IDC MSMT LEADCHNL LV IMPEDANCE VALUE: 1064 Ohm
MDC IDC MSMT LEADCHNL LV IMPEDANCE VALUE: 301.328
MDC IDC MSMT LEADCHNL LV IMPEDANCE VALUE: 301.328
MDC IDC MSMT LEADCHNL LV IMPEDANCE VALUE: 551 Ohm
MDC IDC MSMT LEADCHNL LV IMPEDANCE VALUE: 646 Ohm
MDC IDC MSMT LEADCHNL RV IMPEDANCE VALUE: 399 Ohm
MDC IDC MSMT LEADCHNL RV PACING THRESHOLD AMPLITUDE: 0.625 V
MDC IDC MSMT LEADCHNL RV SENSING INTR AMPL: 13 mV
MDC IDC SESS DTM: 20190719002604
MDC IDC SET LEADCHNL LV PACING AMPLITUDE: 2.5 V
MDC IDC SET LEADCHNL LV PACING PULSEWIDTH: 0.8 ms
MDC IDC SET LEADCHNL RV PACING AMPLITUDE: 2.5 V
MDC IDC STAT BRADY AP VS PERCENT: 0.05 %
MDC IDC STAT BRADY AS VP PERCENT: 97.48 %
MDC IDC STAT BRADY RV PERCENT PACED: 1.53 %

## 2018-05-22 LAB — KAPPA/LAMBDA LIGHT CHAINS
KAPPA, LAMDA LIGHT CHAIN RATIO: 2.06 — AB (ref 0.26–1.65)
Kappa free light chain: 25.5 mg/L — ABNORMAL HIGH (ref 3.3–19.4)
Lambda free light chains: 12.4 mg/L (ref 5.7–26.3)

## 2018-05-23 LAB — MULTIPLE MYELOMA PANEL, SERUM
ALBUMIN SERPL ELPH-MCNC: 3.9 g/dL (ref 2.9–4.4)
ALBUMIN/GLOB SERPL: 1.2 (ref 0.7–1.7)
ALPHA 1: 0.2 g/dL (ref 0.0–0.4)
ALPHA2 GLOB SERPL ELPH-MCNC: 0.8 g/dL (ref 0.4–1.0)
B-GLOBULIN SERPL ELPH-MCNC: 1.5 g/dL — AB (ref 0.7–1.3)
GAMMA GLOB SERPL ELPH-MCNC: 0.9 g/dL (ref 0.4–1.8)
Globulin, Total: 3.4 g/dL (ref 2.2–3.9)
IgA: 709 mg/dL — ABNORMAL HIGH (ref 87–352)
IgG (Immunoglobin G), Serum: 884 mg/dL (ref 700–1600)
IgM (Immunoglobulin M), Srm: 113 mg/dL (ref 26–217)
M PROTEIN SERPL ELPH-MCNC: 0.5 g/dL — AB
Total Protein ELP: 7.3 g/dL (ref 6.0–8.5)

## 2018-06-16 ENCOUNTER — Ambulatory Visit (INDEPENDENT_AMBULATORY_CARE_PROVIDER_SITE_OTHER): Payer: BLUE CROSS/BLUE SHIELD

## 2018-06-16 ENCOUNTER — Telehealth: Payer: Self-pay

## 2018-06-16 DIAGNOSIS — I5022 Chronic systolic (congestive) heart failure: Secondary | ICD-10-CM

## 2018-06-16 DIAGNOSIS — Z95810 Presence of automatic (implantable) cardiac defibrillator: Secondary | ICD-10-CM

## 2018-06-16 NOTE — Telephone Encounter (Signed)
LMOVM reminding pt to send remote transmission.   

## 2018-06-17 DIAGNOSIS — I5022 Chronic systolic (congestive) heart failure: Secondary | ICD-10-CM

## 2018-06-17 DIAGNOSIS — Z95810 Presence of automatic (implantable) cardiac defibrillator: Secondary | ICD-10-CM

## 2018-06-20 ENCOUNTER — Telehealth: Payer: Self-pay

## 2018-06-20 NOTE — Progress Notes (Signed)
EPIC Encounter for ICM Monitoring  Patient Name: Margaret Hendricks is a 62 y.o. female Date: 06/20/2018 Primary Care Physican: Perrin Maltese, MD Primary Cardiologist: Caryl Comes Electrophysiologist: Faustino Congress Weight:Previous [id number] lbs Bi-V Pacing: 98.5%      Attempted call to patient and unable to reach.  Left detailed message, per DPR, regarding transmission.  Transmission reviewed.    Thoracic impedance normal.  Prescribed dosage: Furosemide 20 mg 1 tablet daily  Labs: 05/21/2018 Creatinine 0.87, BUN 17, Potassium 4.0, Sodium 138, EGFR >60 05/21/2017 Creatinine 0.80, BUN 17, Potassium 4.0, Sodium 135, EGFR >60 12/20/2016 Creatinine 0.77, BUN 19, Potassium 4.3, Sodium 136  Recommendations: Left voice mail with ICM number and encouraged to call if experiencing any fluid symptoms.  Follow-up plan: ICM clinic phone appointment on 07/21/2018.       Copy of ICM check sent to Dr. Caryl Comes.   3 month ICM trend: 06/17/2018    1 Year ICM trend:       [name], RN 06/20/2018 12:04 PM

## 2018-06-20 NOTE — Telephone Encounter (Signed)
Remote ICM transmission received.  Attempted call to patient and left detailed message, per DPR, regarding transmission and next ICM scheduled for 07/21/2018.  Advised to return call for any fluid symptoms or questions.

## 2018-06-30 ENCOUNTER — Ambulatory Visit: Payer: Medicare Other

## 2018-08-01 NOTE — Progress Notes (Signed)
No ICM remote transmission received for 07/21/2018 and next ICM transmission scheduled for 08/14/2018.

## 2018-08-14 ENCOUNTER — Ambulatory Visit (INDEPENDENT_AMBULATORY_CARE_PROVIDER_SITE_OTHER): Payer: Self-pay

## 2018-08-14 ENCOUNTER — Ambulatory Visit (INDEPENDENT_AMBULATORY_CARE_PROVIDER_SITE_OTHER): Payer: Self-pay | Admitting: *Deleted

## 2018-08-14 DIAGNOSIS — Z95810 Presence of automatic (implantable) cardiac defibrillator: Secondary | ICD-10-CM

## 2018-08-14 DIAGNOSIS — I428 Other cardiomyopathies: Secondary | ICD-10-CM

## 2018-08-14 DIAGNOSIS — I5022 Chronic systolic (congestive) heart failure: Secondary | ICD-10-CM

## 2018-08-14 NOTE — Progress Notes (Signed)
Remote ICD transmission.   

## 2018-08-15 ENCOUNTER — Telehealth: Payer: Self-pay

## 2018-08-15 NOTE — Progress Notes (Signed)
EPIC Encounter for ICM Monitoring  Patient Name: Margaret Hendricks is a 62 y.o. female Date: 08/15/2018 Primary Care Physican: Perrin Maltese, MD Primary Cardiologist: Caryl Comes Electrophysiologist: Faustino Congress Weight:Previous [id number] lbs Bi-V Pacing: 98.4%           Attempted call to patient and unable to reach.  Left detailed message, per DPR, regarding transmission.  Transmission reviewed.    Thoracic impedance normal.   Prescribed: Furosemide 20 mg 1 tablet daily  Labs: 05/21/2018 Creatinine 0.87, BUN 17, Potassium 4.0, Sodium 138, EGFR >60  Recommendations: Unable to reach.  Follow-up plan: ICM clinic phone appointment on 09/15/2018.    Copy of ICM check sent to Dr. Caryl Comes.   3 month ICM trend: 08/14/2018    1 Year ICM trend:       [name], RN 08/15/2018 11:29 AM

## 2018-08-15 NOTE — Telephone Encounter (Signed)
Remote ICM transmission received.  Attempted call to patient regarding ICM remote transmission and left detailed message, per DPR, with next ICM remote transmission date of 09/15/2018.  Advised to return call for any fluid symptoms or questions.    

## 2018-09-13 IMAGING — US US ABDOMEN COMPLETE
1 series · 13 of 25 positions shown · non-contrast
Comparison: None in PACs.

CLINICAL DATA: Abnormal liver function studies

EXAM:
ABDOMEN ULTRASOUND COMPLETE

[Series 1: us abdomen complete · 0.22mm/px · 13 of 82 slices shown]
[im 1/82]
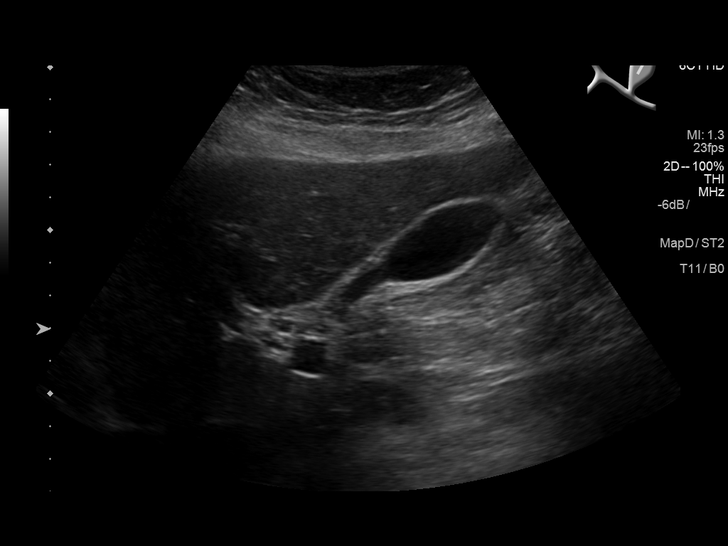
[im 7/82]
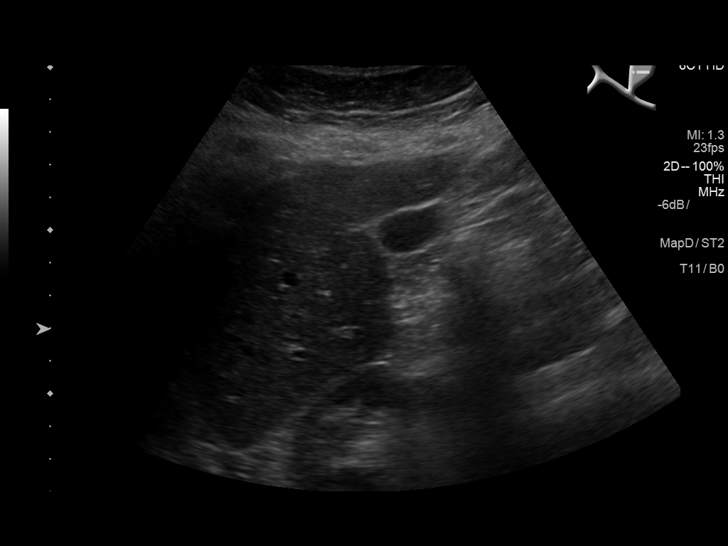
[im 14/82]
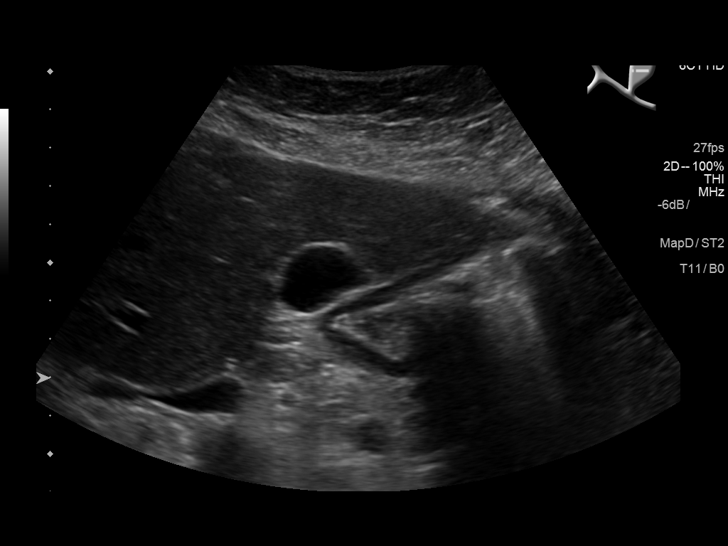
[im 21/82]
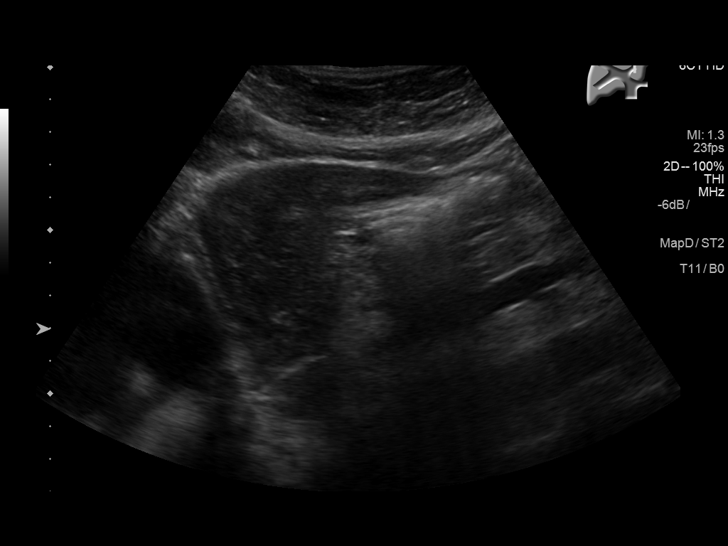
[im 28/82]
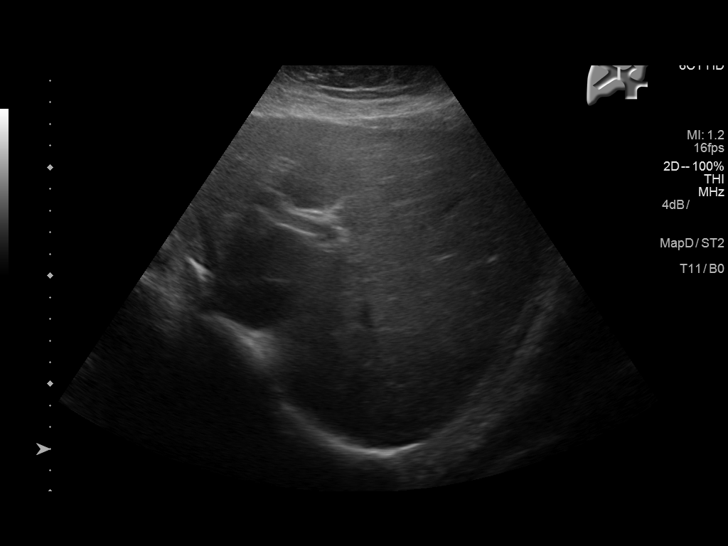
[im 34/82]
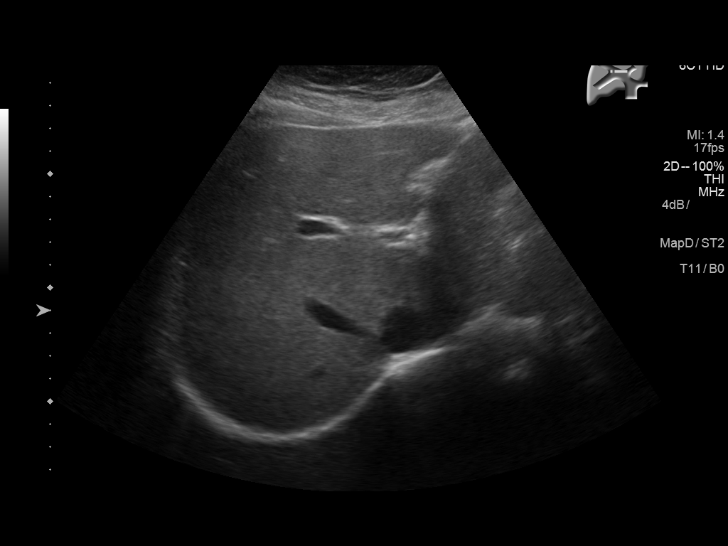
[im 41/82]
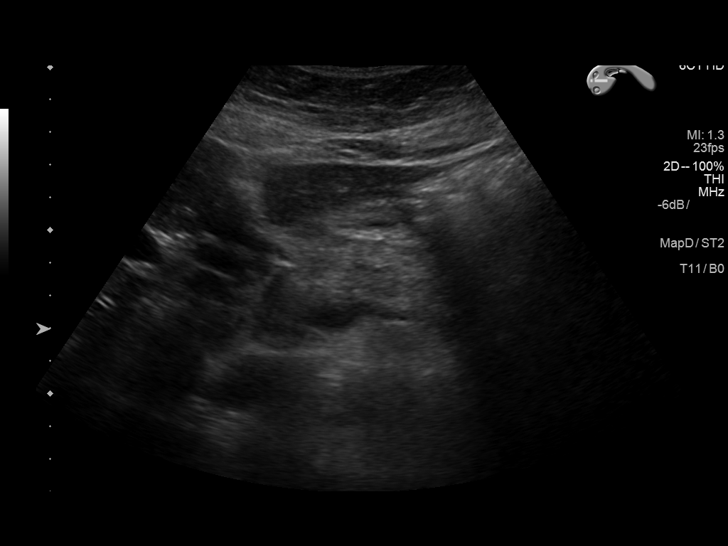
[im 48/82]
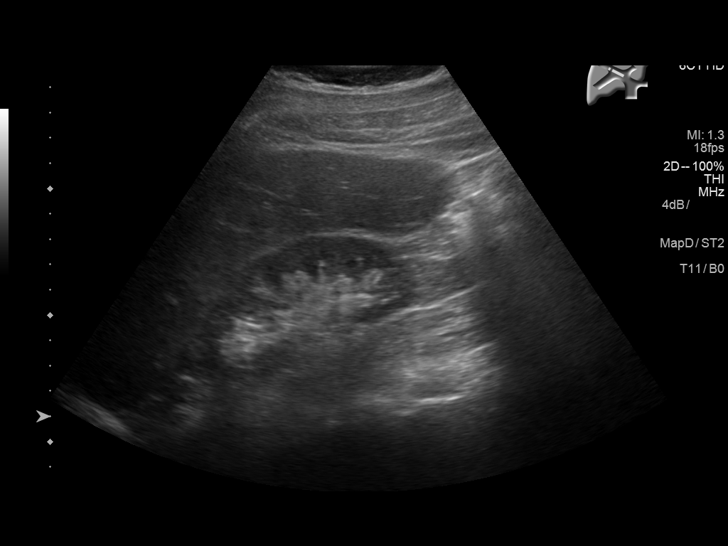
[im 55/82]
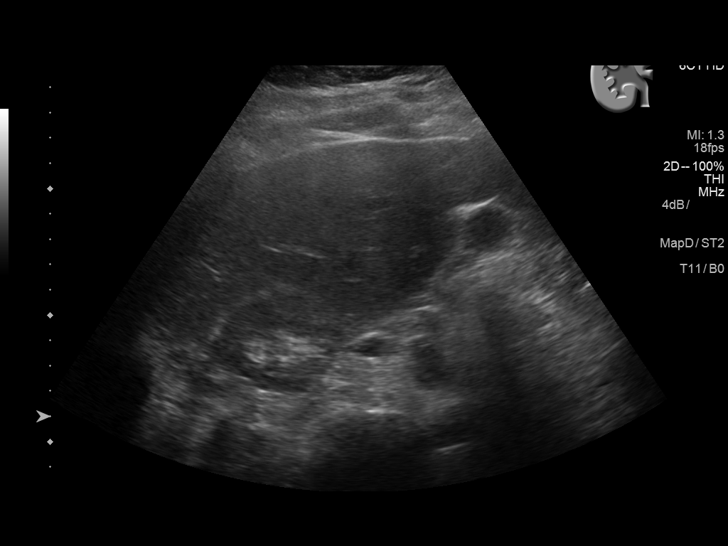
[im 61/82]
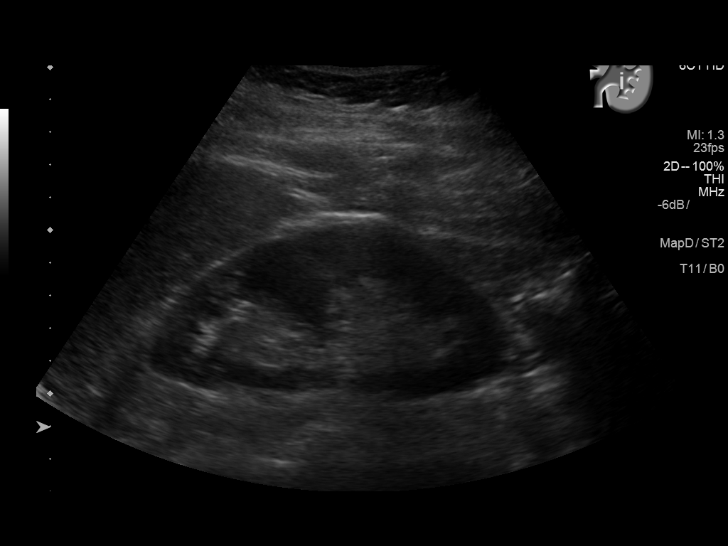
[im 68/82]
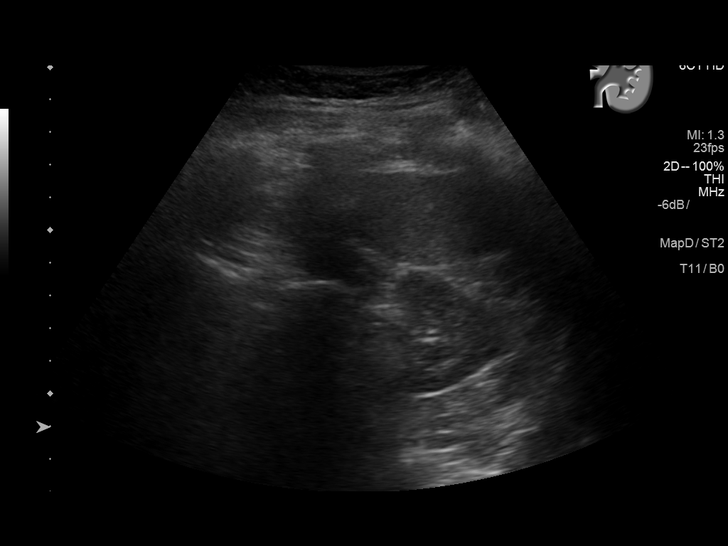
[im 75/82]
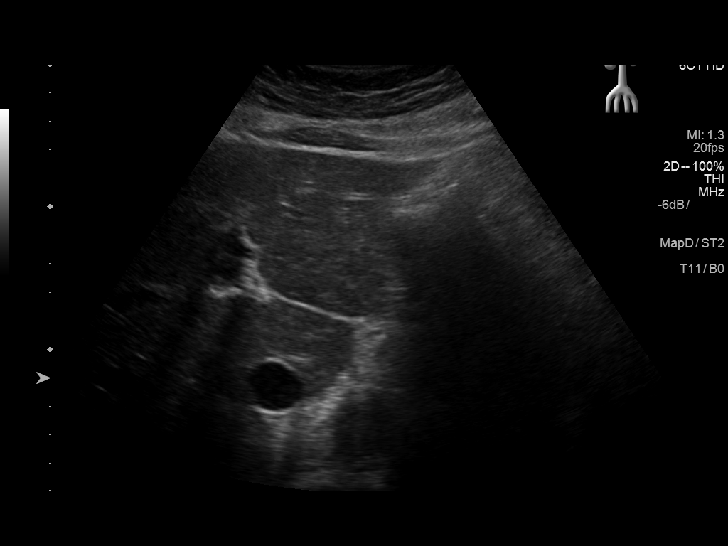
[im 82/82]
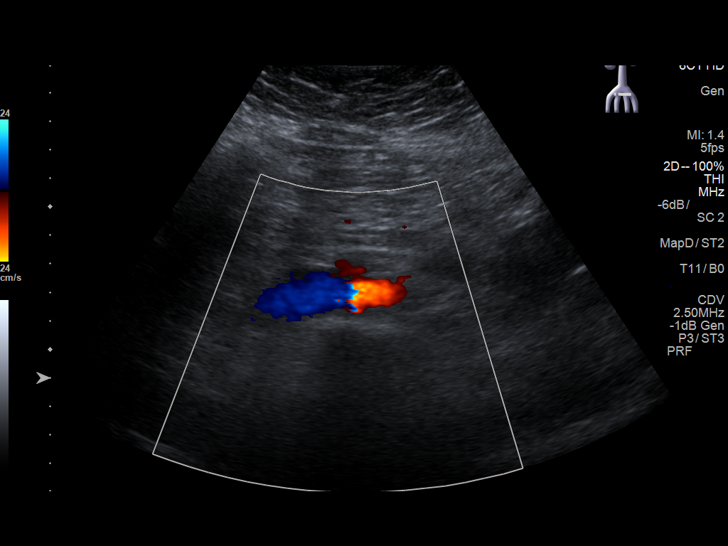

[13 of 25 positions shown; findings below may reference images not displayed]

FINDINGS: Gallbladder: The gallbladder is adequately distended with no
evidence of stones, wall thickening, or pericholecystic fluid. There
is no positive sonographic Murphy's sign.

Common bile duct: Diameter: 2.4 mm

Liver: The hepatic echotexture is mildly increased diffusely. The
surface contour appears smooth. There is no focal mass or ductal
dilation.

IVC: No abnormality visualized.

Pancreas: The pancreatic head and body are normal. The pancreatic
tail is obscured by bowel gas.

Spleen: Size and appearance within normal limits.

Right Kidney: Length: 9.2 cm. Echogenicity within normal limits. No
mass or hydronephrosis visualized.

Left Kidney: Length: 10.9 cm. Echogenicity within normal limits. No
mass or hydronephrosis visualized.

Abdominal aorta: No aneurysm visualized.

Other findings: There is no ascites.
IMPRESSION: Mildly increased hepatic echotexture likely reflects fatty
infiltrative change. There is no focal mass or ductal dilation.

Normal appearing gallbladder. Normal-appearing pancreatic head and
body but the tail is obscured by bowel gas.

## 2018-09-15 ENCOUNTER — Ambulatory Visit (INDEPENDENT_AMBULATORY_CARE_PROVIDER_SITE_OTHER): Payer: Self-pay

## 2018-09-15 DIAGNOSIS — Z95810 Presence of automatic (implantable) cardiac defibrillator: Secondary | ICD-10-CM

## 2018-09-15 DIAGNOSIS — I5022 Chronic systolic (congestive) heart failure: Secondary | ICD-10-CM

## 2018-09-15 NOTE — Progress Notes (Signed)
EPIC Encounter for ICM Monitoring  Patient Name: Margaret Hendricks is a 62 y.o. female Date: 09/15/2018 Primary Care Physican: Perrin Maltese, MD Primary Cardiologist: Caryl Comes Electrophysiologist: Vergie Living Pacing: 98.5%  Last Weight: 167 lbs Today's Weight: unknown       Attempted call to patient and unable to reach.  Left detailed message, per DPR, regarding transmission.  Transmission reviewed.    Thoracic impedance normal.   Prescribed: Furosemide 20 mg 1 tablet daily  Labs: 05/21/2018 Creatinine 0.87, BUN 17, Potassium 4.0, Sodium 138, EGFR >60  Recommendations: Left voice mail with ICM number and encouraged to call if experiencing any fluid symptoms.  Follow-up plan: ICM clinic phone appointment on 10/16/2018.    Copy of ICM check sent to Dr. Caryl Comes.   3 month ICM trend: 09/15/2018    1 Year ICM trend:       [name], RN 09/15/2018 1:09 PM

## 2018-09-16 ENCOUNTER — Telehealth: Payer: Self-pay

## 2018-09-16 NOTE — Telephone Encounter (Signed)
Remote ICM transmission received.  Attempted call to patient regarding ICM remote transmission and left detailed message, per DPR, with next ICM remote transmission date of 10/16/2018.  Advised to return call for any fluid symptoms or questions.

## 2018-10-16 ENCOUNTER — Telehealth: Payer: Self-pay

## 2018-10-16 ENCOUNTER — Ambulatory Visit (INDEPENDENT_AMBULATORY_CARE_PROVIDER_SITE_OTHER): Payer: Self-pay

## 2018-10-16 DIAGNOSIS — Z95810 Presence of automatic (implantable) cardiac defibrillator: Secondary | ICD-10-CM

## 2018-10-16 DIAGNOSIS — I5022 Chronic systolic (congestive) heart failure: Secondary | ICD-10-CM

## 2018-10-16 LAB — CUP PACEART REMOTE DEVICE CHECK
Battery Remaining Longevity: 75 mo
Battery Voltage: 2.99 V
Brady Statistic AP VP Percent: 1.33 %
Brady Statistic AS VP Percent: 97.2 %
Brady Statistic AS VS Percent: 1.42 %
Brady Statistic RA Percent Paced: 1.38 %
Date Time Interrogation Session: 20191017052502
HighPow Impedance: 70 Ohm
Implantable Lead Implant Date: 20170918
Implantable Lead Implant Date: 20170918
Implantable Lead Location: 753859
Implantable Lead Model: 5076
Implantable Pulse Generator Implant Date: 20170918
Lead Channel Impedance Value: 1064 Ohm
Lead Channel Impedance Value: 1121 Ohm
Lead Channel Impedance Value: 297.365
Lead Channel Impedance Value: 437 Ohm
Lead Channel Impedance Value: 551 Ohm
Lead Channel Impedance Value: 570 Ohm
Lead Channel Impedance Value: 665 Ohm
Lead Channel Pacing Threshold Amplitude: 0.625 V
Lead Channel Pacing Threshold Amplitude: 0.625 V
Lead Channel Pacing Threshold Amplitude: 1.875 V
Lead Channel Pacing Threshold Pulse Width: 0.4 ms
Lead Channel Pacing Threshold Pulse Width: 0.8 ms
Lead Channel Sensing Intrinsic Amplitude: 10.625 mV
Lead Channel Sensing Intrinsic Amplitude: 10.625 mV
Lead Channel Sensing Intrinsic Amplitude: 3.25 mV
Lead Channel Setting Pacing Amplitude: 2 V
Lead Channel Setting Pacing Amplitude: 3 V
Lead Channel Setting Pacing Pulse Width: 0.8 ms
MDC IDC LEAD IMPLANT DT: 20170918
MDC IDC LEAD LOCATION: 753858
MDC IDC LEAD LOCATION: 753860
MDC IDC MSMT LEADCHNL LV IMPEDANCE VALUE: 1045 Ohm
MDC IDC MSMT LEADCHNL LV IMPEDANCE VALUE: 1045 Ohm
MDC IDC MSMT LEADCHNL LV IMPEDANCE VALUE: 1064 Ohm
MDC IDC MSMT LEADCHNL LV IMPEDANCE VALUE: 280.169
MDC IDC MSMT LEADCHNL LV IMPEDANCE VALUE: 301.328
MDC IDC MSMT LEADCHNL LV IMPEDANCE VALUE: 302.813
MDC IDC MSMT LEADCHNL LV IMPEDANCE VALUE: 306.923
MDC IDC MSMT LEADCHNL LV IMPEDANCE VALUE: 646 Ohm
MDC IDC MSMT LEADCHNL LV IMPEDANCE VALUE: 988 Ohm
MDC IDC MSMT LEADCHNL RA PACING THRESHOLD PULSEWIDTH: 0.4 ms
MDC IDC MSMT LEADCHNL RA SENSING INTR AMPL: 3.25 mV
MDC IDC MSMT LEADCHNL RV IMPEDANCE VALUE: 380 Ohm
MDC IDC MSMT LEADCHNL RV IMPEDANCE VALUE: 494 Ohm
MDC IDC SET LEADCHNL RV PACING AMPLITUDE: 2.5 V
MDC IDC SET LEADCHNL RV PACING PULSEWIDTH: 0.4 ms
MDC IDC SET LEADCHNL RV SENSING SENSITIVITY: 0.3 mV
MDC IDC STAT BRADY AP VS PERCENT: 0.06 %
MDC IDC STAT BRADY RV PERCENT PACED: 0.97 %

## 2018-10-16 NOTE — Telephone Encounter (Signed)
Remote ICM transmission received.  Attempted call to patient regarding ICM remote transmission and left detailed message, per DPR, to return call.    

## 2018-10-16 NOTE — Progress Notes (Signed)
EPIC Encounter for ICM Monitoring  Patient Name: Margaret Hendricks is a 62 y.o. female Date: 10/16/2018 Primary Care Physican: Perrin Maltese, MD Primary Cardiologist: Caryl Comes Electrophysiologist: Vergie Living Pacing: 98.5%            Last Weight: 167 lbs Today's Weight: 171 lbs                                                   Attempted call to patient and unable to reach.  Left detailed message, per DPR, regarding transmission.  Transmission reviewed.    Thoracic impedance abnormal suggesting fluid accumulation starting 10/09/2018.  Prescribed: Furosemide 20 mg 1 tablet daily  Labs: 05/21/2018 Creatinine 0.87, BUN 17, Potassium 4.0, Sodium 138, EGFR >60  Recommendations: Left voice mail with ICM number and encouraged to call if experiencing any fluid symptoms.  Follow-up plan: ICM clinic phone appointment on 10/27/2018 to recheck fluid levels.    Copy of ICM check sent to Dr. Caryl Comes  3 month ICM trend: 10/16/2018    1 Year ICM trend:       [name], RN 10/16/2018 11:31 AM

## 2018-10-16 NOTE — Progress Notes (Signed)
Patient returned call.  Transmission reviewed. Shoes are tight and feet are swollen.  Weight increase of 4 pounds from eating holiday foods and was on vacation for a week.  Advised to limit salt intake. RECOMMENDED:  Advised to increase Furosemide 20 mg to 2 tablets every AM x 3 days and after 3rd day return to 1 tablet daily. She verbalized understanding.  Next ICM remote transmission rescheduled to 10/20/2018

## 2018-10-20 ENCOUNTER — Telehealth: Payer: Self-pay

## 2018-10-20 ENCOUNTER — Ambulatory Visit (INDEPENDENT_AMBULATORY_CARE_PROVIDER_SITE_OTHER): Payer: Self-pay

## 2018-10-20 DIAGNOSIS — I5022 Chronic systolic (congestive) heart failure: Secondary | ICD-10-CM

## 2018-10-20 DIAGNOSIS — Z95810 Presence of automatic (implantable) cardiac defibrillator: Secondary | ICD-10-CM

## 2018-10-20 NOTE — Telephone Encounter (Signed)
LMOVM reminding pt to send remote transmission.   

## 2018-10-21 ENCOUNTER — Telehealth: Payer: Self-pay

## 2018-10-21 NOTE — Telephone Encounter (Signed)
Remote ICM transmission received.  Attempted call to patient regarding ICM remote transmission and left detailed message, per DPR, with next ICM remote transmission date of 11/20/2018.  Advised to return call for any fluid symptoms or questions.    

## 2018-10-21 NOTE — Progress Notes (Signed)
EPIC Encounter for ICM Monitoring  Patient Name: Margaret Hendricks is a 62 y.o. female Date: 10/21/2018 Primary Care Physican: Perrin Maltese, MD Primary Cardiologist: Caryl Comes Electrophysiologist: Vergie Living Pacing: 98.6% Baseline Weight:167 lbs Today's Weight: 171 lbs   Attempted call to patient and unable to reach.Left detailed message, per DPR, regarding transmission. Transmission reviewed.   Thoracic impedance returned to normal after taking 3 days of extra Furosemide.  Prescribed:Furosemide 20 mg 1 tablet daily  Labs: 05/21/2018 Creatinine 0.87, BUN 17, Potassium 4.0, Sodium 138, EGFR >60  Recommendations:Left voice mail with ICM number and encouraged to call if experiencing any fluid symptoms.  Follow-up plan: ICM clinic phone appointment on1/23/2020.  Copy of ICM check sent to [place]  3 month ICM trend: 10/20/2018    1 Year ICM trend:       Margaret Billings, RN 10/21/2018 8:28 AM

## 2018-11-20 ENCOUNTER — Ambulatory Visit (INDEPENDENT_AMBULATORY_CARE_PROVIDER_SITE_OTHER): Payer: Self-pay

## 2018-11-20 DIAGNOSIS — Z95810 Presence of automatic (implantable) cardiac defibrillator: Secondary | ICD-10-CM

## 2018-11-20 DIAGNOSIS — I5022 Chronic systolic (congestive) heart failure: Secondary | ICD-10-CM

## 2018-11-20 DIAGNOSIS — I428 Other cardiomyopathies: Secondary | ICD-10-CM

## 2018-11-21 ENCOUNTER — Encounter: Payer: Self-pay | Admitting: Cardiology

## 2018-11-21 ENCOUNTER — Inpatient Hospital Stay: Payer: Medicare Other | Admitting: Oncology

## 2018-11-21 ENCOUNTER — Inpatient Hospital Stay: Payer: Medicare Other

## 2018-11-21 NOTE — Progress Notes (Signed)
Remote ICD transmission.   

## 2018-11-23 LAB — CUP PACEART REMOTE DEVICE CHECK
Battery Remaining Longevity: 73 mo
Brady Statistic AP VP Percent: 1.81 %
Brady Statistic AS VP Percent: 96.69 %
Brady Statistic RV Percent Paced: 0.71 %
Date Time Interrogation Session: 20200123083523
HighPow Impedance: 70 Ohm
Implantable Lead Implant Date: 20170918
Implantable Lead Location: 753858
Implantable Lead Location: 753860
Implantable Pulse Generator Implant Date: 20170918
Lead Channel Impedance Value: 260.473
Lead Channel Impedance Value: 280.169
Lead Channel Impedance Value: 289.049
Lead Channel Impedance Value: 342 Ohm
Lead Channel Impedance Value: 456 Ohm
Lead Channel Impedance Value: 608 Ohm
Lead Channel Impedance Value: 931 Ohm
Lead Channel Impedance Value: 950 Ohm
Lead Channel Impedance Value: 988 Ohm
Lead Channel Impedance Value: 988 Ohm
Lead Channel Pacing Threshold Amplitude: 0.625 V
Lead Channel Pacing Threshold Pulse Width: 0.4 ms
Lead Channel Pacing Threshold Pulse Width: 0.4 ms
Lead Channel Sensing Intrinsic Amplitude: 3.25 mV
Lead Channel Setting Pacing Amplitude: 2.5 V
Lead Channel Setting Pacing Pulse Width: 0.4 ms
Lead Channel Setting Pacing Pulse Width: 0.8 ms
Lead Channel Setting Sensing Sensitivity: 0.3 mV
MDC IDC LEAD IMPLANT DT: 20170918
MDC IDC LEAD IMPLANT DT: 20170918
MDC IDC LEAD LOCATION: 753859
MDC IDC MSMT BATTERY VOLTAGE: 2.99 V
MDC IDC MSMT LEADCHNL LV IMPEDANCE VALUE: 1007 Ohm
MDC IDC MSMT LEADCHNL LV IMPEDANCE VALUE: 264.643
MDC IDC MSMT LEADCHNL LV IMPEDANCE VALUE: 272.552
MDC IDC MSMT LEADCHNL LV IMPEDANCE VALUE: 494 Ohm
MDC IDC MSMT LEADCHNL LV IMPEDANCE VALUE: 551 Ohm
MDC IDC MSMT LEADCHNL LV IMPEDANCE VALUE: 570 Ohm
MDC IDC MSMT LEADCHNL LV IMPEDANCE VALUE: 988 Ohm
MDC IDC MSMT LEADCHNL LV PACING THRESHOLD AMPLITUDE: 1.625 V
MDC IDC MSMT LEADCHNL LV PACING THRESHOLD PULSEWIDTH: 0.8 ms
MDC IDC MSMT LEADCHNL RA IMPEDANCE VALUE: 437 Ohm
MDC IDC MSMT LEADCHNL RA SENSING INTR AMPL: 3.25 mV
MDC IDC MSMT LEADCHNL RV PACING THRESHOLD AMPLITUDE: 0.625 V
MDC IDC MSMT LEADCHNL RV SENSING INTR AMPL: 5.5 mV
MDC IDC MSMT LEADCHNL RV SENSING INTR AMPL: 5.5 mV
MDC IDC SET LEADCHNL LV PACING AMPLITUDE: 2.75 V
MDC IDC SET LEADCHNL RA PACING AMPLITUDE: 2 V
MDC IDC STAT BRADY AP VS PERCENT: 0.07 %
MDC IDC STAT BRADY AS VS PERCENT: 1.43 %
MDC IDC STAT BRADY RA PERCENT PACED: 1.87 %

## 2018-11-24 ENCOUNTER — Telehealth: Payer: Self-pay

## 2018-11-24 NOTE — Telephone Encounter (Signed)
Remote ICM transmission received.  Attempted call to patient regarding ICM remote transmission and left detailed message, per DPR, with next ICM remote transmission date of 12/23/2018.  Advised to return call for any fluid symptoms or questions.    

## 2018-11-24 NOTE — Progress Notes (Signed)
EPIC Encounter for ICM Monitoring  Patient Name: Margaret Hendricks is a 63 y.o. female Date: 11/24/2018 Primary Care Physican: Perrin Maltese, MD Primary Cardiologist: Caryl Comes Electrophysiologist: Vergie Living Pacing: 98.6% Effective 98.3% Baseline Weight:171 lbs Today's Weight:unknown   Attempted call to patient and unable to reach.Left detailed message, per DPR, regarding transmission. Transmission reviewed.   Thoracic impedancenormal.  Prescribed:Furosemide 20 mg 1 tablet daily  Labs: 05/21/2018 Creatinine 0.87, BUN 17, Potassium 4.0, Sodium 138, EGFR >60  Recommendations:Left voice mail with ICM number and encouraged to call if experiencing any fluid symptoms.  Follow-up plan: ICM clinic phone appointment on2/25/2020.  Copy of ICM check sent to [place]  3 month ICM trend: 11/20/2018    1 Year ICM trend:       [name], RN 11/24/2018 12:36 PM

## 2018-12-11 ENCOUNTER — Other Ambulatory Visit: Payer: Self-pay | Admitting: Family

## 2018-12-11 DIAGNOSIS — Z1231 Encounter for screening mammogram for malignant neoplasm of breast: Secondary | ICD-10-CM

## 2018-12-23 ENCOUNTER — Ambulatory Visit (INDEPENDENT_AMBULATORY_CARE_PROVIDER_SITE_OTHER): Payer: Self-pay

## 2018-12-23 DIAGNOSIS — Z95810 Presence of automatic (implantable) cardiac defibrillator: Secondary | ICD-10-CM

## 2018-12-23 DIAGNOSIS — I5022 Chronic systolic (congestive) heart failure: Secondary | ICD-10-CM

## 2018-12-24 ENCOUNTER — Telehealth: Payer: Self-pay

## 2018-12-24 NOTE — Progress Notes (Signed)
EPIC Encounter for ICM Monitoring  Patient Name: Reem Fleury is a 63 y.o. female Date: 12/24/2018 Primary Care Physican: Perrin Maltese, MD Primary Cardiologist: Caryl Comes Electrophysiologist: Vergie Living Pacing: 98.3% Effective 98.2% BaselineWeight:171lbs Today's Weight:unknown   Attempted call to patient and unable to reach.Left detailed message, per DPR, regarding transmission. Transmission reviewed.   Thoracic impedancenormal.  Prescribed:Furosemide 20 mg 1 tablet daily  Labs: 05/21/2018 Creatinine 0.87, BUN 17, Potassium 4.0, Sodium 138, EGFR >60  Recommendations:Left voice mail with ICM number and encouraged to call if experiencing any fluid symptoms.  Follow-up plan: ICM clinic phone appointment on3/30/2020.  Copy of ICM check sent to [place]  3 month ICM trend: 12/23/2018    1 Year ICM trend:       [name], RN 12/24/2018 8:55 AM

## 2018-12-24 NOTE — Telephone Encounter (Signed)
Remote ICM transmission received.  Attempted call to patient regarding ICM remote transmission and left detailed message, per DPR, with next ICM remote transmission date of 01/26/2019.  Advised to return call for any fluid symptoms or questions.   

## 2019-01-26 ENCOUNTER — Ambulatory Visit (INDEPENDENT_AMBULATORY_CARE_PROVIDER_SITE_OTHER): Payer: Self-pay

## 2019-01-26 ENCOUNTER — Other Ambulatory Visit: Payer: Self-pay

## 2019-01-26 DIAGNOSIS — I5022 Chronic systolic (congestive) heart failure: Secondary | ICD-10-CM

## 2019-01-26 DIAGNOSIS — Z95810 Presence of automatic (implantable) cardiac defibrillator: Secondary | ICD-10-CM

## 2019-01-27 ENCOUNTER — Telehealth: Payer: Self-pay

## 2019-01-27 NOTE — Progress Notes (Signed)
EPIC Encounter for ICM Monitoring  Patient Name: Margaret Hendricks is a 63 y.o. female Date: 01/27/2019 Primary Care Physican: Khan, Neelam S, MD Primary Cardiologist: Klein Electrophysiologist: Klein Bi-V Pacing: 98.3% Effective 98.2% BaselineWeight:171lbs 01/27/2019 Weight:unknown   Attempted call to patient and unable to reach.Left detailed message, per DPR, regarding transmission. Transmission reviewed.   Thoracic impedancenormal.  Prescribed:Furosemide 20 mg 1 tablet daily  Labs: 05/21/2018 Creatinine 0.87, BUN 17, Potassium 4.0, Sodium 138, EGFR >60  Recommendations:Left voice mail with ICM number and encouraged to call if experiencing any fluid symptoms.  Follow-up plan: ICM clinic phone appointment on5/01/2019.  Copy of ICM check sent to Dr.Klein  3 month ICM trend: 01/26/2019    1 Year ICM trend:       Laurie S Short, RN 01/27/2019 10:10 AM   

## 2019-01-27 NOTE — Telephone Encounter (Signed)
Remote ICM transmission received.  Attempted call to patient regarding ICM remote transmission and left detailed message, per DPR, with next ICM remote transmission date of 03/02/2019.  Advised to return call for any fluid symptoms or questions.    

## 2019-02-19 ENCOUNTER — Ambulatory Visit (INDEPENDENT_AMBULATORY_CARE_PROVIDER_SITE_OTHER): Payer: Self-pay | Admitting: *Deleted

## 2019-02-19 ENCOUNTER — Other Ambulatory Visit: Payer: Self-pay

## 2019-02-19 DIAGNOSIS — I5022 Chronic systolic (congestive) heart failure: Secondary | ICD-10-CM

## 2019-02-19 DIAGNOSIS — I428 Other cardiomyopathies: Secondary | ICD-10-CM

## 2019-02-19 LAB — CUP PACEART REMOTE DEVICE CHECK
Battery Remaining Longevity: 70 mo
Battery Voltage: 2.99 V
Brady Statistic AP VP Percent: 2.26 %
Brady Statistic AP VS Percent: 0.08 %
Brady Statistic AS VP Percent: 96.18 %
Brady Statistic AS VS Percent: 1.49 %
Brady Statistic RA Percent Paced: 2.32 %
Brady Statistic RV Percent Paced: 0.87 %
Date Time Interrogation Session: 20200423041703
HighPow Impedance: 78 Ohm
Implantable Lead Implant Date: 20170918
Implantable Lead Implant Date: 20170918
Implantable Lead Implant Date: 20170918
Implantable Lead Location: 753858
Implantable Lead Location: 753859
Implantable Lead Location: 753860
Implantable Lead Model: 5076
Implantable Pulse Generator Implant Date: 20170918
Lead Channel Impedance Value: 1007 Ohm
Lead Channel Impedance Value: 1045 Ohm
Lead Channel Impedance Value: 1045 Ohm
Lead Channel Impedance Value: 1064 Ohm
Lead Channel Impedance Value: 1102 Ohm
Lead Channel Impedance Value: 1102 Ohm
Lead Channel Impedance Value: 285 Ohm
Lead Channel Impedance Value: 285 Ohm
Lead Channel Impedance Value: 285 Ohm
Lead Channel Impedance Value: 306.923
Lead Channel Impedance Value: 306.923
Lead Channel Impedance Value: 399 Ohm
Lead Channel Impedance Value: 456 Ohm
Lead Channel Impedance Value: 494 Ohm
Lead Channel Impedance Value: 570 Ohm
Lead Channel Impedance Value: 570 Ohm
Lead Channel Impedance Value: 570 Ohm
Lead Channel Impedance Value: 665 Ohm
Lead Channel Pacing Threshold Amplitude: 0.625 V
Lead Channel Pacing Threshold Amplitude: 0.625 V
Lead Channel Pacing Threshold Amplitude: 1.625 V
Lead Channel Pacing Threshold Pulse Width: 0.4 ms
Lead Channel Pacing Threshold Pulse Width: 0.4 ms
Lead Channel Pacing Threshold Pulse Width: 0.8 ms
Lead Channel Sensing Intrinsic Amplitude: 11.125 mV
Lead Channel Sensing Intrinsic Amplitude: 3 mV
Lead Channel Setting Pacing Amplitude: 2 V
Lead Channel Setting Pacing Amplitude: 2.5 V
Lead Channel Setting Pacing Amplitude: 2.75 V
Lead Channel Setting Pacing Pulse Width: 0.4 ms
Lead Channel Setting Pacing Pulse Width: 0.8 ms
Lead Channel Setting Sensing Sensitivity: 0.3 mV

## 2019-02-27 NOTE — Progress Notes (Signed)
Remote ICD transmission.   

## 2019-03-02 ENCOUNTER — Other Ambulatory Visit: Payer: Self-pay

## 2019-03-02 ENCOUNTER — Ambulatory Visit (INDEPENDENT_AMBULATORY_CARE_PROVIDER_SITE_OTHER): Payer: Self-pay

## 2019-03-02 DIAGNOSIS — I5022 Chronic systolic (congestive) heart failure: Secondary | ICD-10-CM

## 2019-03-02 DIAGNOSIS — Z95810 Presence of automatic (implantable) cardiac defibrillator: Secondary | ICD-10-CM

## 2019-03-04 NOTE — Progress Notes (Signed)
EPIC Encounter for ICM Monitoring  Patient Name: Margaret Hendricks is a 63 y.o. female Date: 03/04/2019 Primary Care Physican: Perrin Maltese, MD Primary Cardiologist: Caryl Comes Electrophysiologist: Vergie Living Pacing: 98.3% Effective 98.2% BaselineWeight:171lbs 01/27/2019 Weight:unknown   Transmission reviewed.   Thoracic impedancenormal.  Prescribed:Furosemide 20 mg 1 tablet daily  Labs: 05/21/2018 Creatinine 0.87, BUN 17, Potassium 4.0, Sodium 138, EGFR >60  Recommendations: None  Follow-up plan: ICM clinic phone appointment on 04/06/2019.  Copy of ICM check sent to [place]  3 month ICM trend: 03/02/2019    1 Year ICM trend:       [name], RN 03/04/2019 7:46 AM

## 2019-04-07 ENCOUNTER — Telehealth: Payer: Self-pay

## 2019-04-07 NOTE — Telephone Encounter (Signed)
Unable to leave a message for patient to remind of missed remote transmission.  

## 2019-04-20 NOTE — Progress Notes (Signed)
No ICM remote transmission received for 04/06/2019 and next ICM transmission scheduled for 05/18/2019.

## 2019-05-18 ENCOUNTER — Ambulatory Visit (INDEPENDENT_AMBULATORY_CARE_PROVIDER_SITE_OTHER): Payer: Medicare Other

## 2019-05-18 DIAGNOSIS — I5022 Chronic systolic (congestive) heart failure: Secondary | ICD-10-CM

## 2019-05-18 DIAGNOSIS — Z95810 Presence of automatic (implantable) cardiac defibrillator: Secondary | ICD-10-CM | POA: Diagnosis not present

## 2019-05-19 NOTE — Progress Notes (Signed)
EPIC Encounter for ICM Monitoring  Patient Name: Margaret Hendricks is a 63 y.o. female Date: 05/19/2019 Primary Care Physican: Perrin Maltese, MD Primary Cardiologist: Caryl Comes Electrophysiologist: Vergie Living Pacing: 98.4% Effective 98.3% BaselineWeight:171lbs Last Weight:unknown       Transmission reviewed.   Optivol thoracic impedancenormal.  Prescribed:Furosemide 20 mg 1 tablet daily  Labs: 05/21/2018 Creatinine 0.87, BUN 17, Potassium 4.0, Sodium 138, EGFR >60  Recommendations: None  Follow-up plan: ICM clinic phone appointment on 06/22/2019.  Copy of ICM check sent to [place]  3 month ICM trend: 05/18/2019    1 Year ICM trend:       [name], RN 05/19/2019 4:49 PM

## 2019-05-21 ENCOUNTER — Ambulatory Visit (INDEPENDENT_AMBULATORY_CARE_PROVIDER_SITE_OTHER): Payer: Medicare Other | Admitting: *Deleted

## 2019-05-21 DIAGNOSIS — I428 Other cardiomyopathies: Secondary | ICD-10-CM

## 2019-05-21 DIAGNOSIS — I447 Left bundle-branch block, unspecified: Secondary | ICD-10-CM

## 2019-05-22 LAB — CUP PACEART REMOTE DEVICE CHECK
Battery Remaining Longevity: 76 mo
Battery Voltage: 2.99 V
Brady Statistic AP VP Percent: 1.74 %
Brady Statistic AP VS Percent: 0.06 %
Brady Statistic AS VP Percent: 96.75 %
Brady Statistic AS VS Percent: 1.46 %
Brady Statistic RA Percent Paced: 1.79 %
Brady Statistic RV Percent Paced: 1.09 %
Date Time Interrogation Session: 20200720052403
HighPow Impedance: 73 Ohm
Implantable Lead Implant Date: 20170918
Implantable Lead Implant Date: 20170918
Implantable Lead Implant Date: 20170918
Implantable Lead Location: 753858
Implantable Lead Location: 753859
Implantable Lead Location: 753860
Implantable Lead Model: 5076
Implantable Pulse Generator Implant Date: 20170918
Lead Channel Impedance Value: 1007 Ohm
Lead Channel Impedance Value: 1045 Ohm
Lead Channel Impedance Value: 1064 Ohm
Lead Channel Impedance Value: 1102 Ohm
Lead Channel Impedance Value: 1102 Ohm
Lead Channel Impedance Value: 270 Ohm
Lead Channel Impedance Value: 285.934
Lead Channel Impedance Value: 296.578
Lead Channel Impedance Value: 302.813
Lead Channel Impedance Value: 314.776
Lead Channel Impedance Value: 380 Ohm
Lead Channel Impedance Value: 456 Ohm
Lead Channel Impedance Value: 456 Ohm
Lead Channel Impedance Value: 513 Ohm
Lead Channel Impedance Value: 570 Ohm
Lead Channel Impedance Value: 646 Ohm
Lead Channel Impedance Value: 703 Ohm
Lead Channel Impedance Value: 988 Ohm
Lead Channel Pacing Threshold Amplitude: 0.625 V
Lead Channel Pacing Threshold Amplitude: 0.625 V
Lead Channel Pacing Threshold Amplitude: 1.75 V
Lead Channel Pacing Threshold Pulse Width: 0.4 ms
Lead Channel Pacing Threshold Pulse Width: 0.4 ms
Lead Channel Pacing Threshold Pulse Width: 0.8 ms
Lead Channel Sensing Intrinsic Amplitude: 12.5 mV
Lead Channel Sensing Intrinsic Amplitude: 12.5 mV
Lead Channel Sensing Intrinsic Amplitude: 3.375 mV
Lead Channel Sensing Intrinsic Amplitude: 3.375 mV
Lead Channel Setting Pacing Amplitude: 2 V
Lead Channel Setting Pacing Amplitude: 2.5 V
Lead Channel Setting Pacing Amplitude: 2.5 V
Lead Channel Setting Pacing Pulse Width: 0.4 ms
Lead Channel Setting Pacing Pulse Width: 0.8 ms
Lead Channel Setting Sensing Sensitivity: 0.3 mV

## 2019-06-01 NOTE — Progress Notes (Signed)
Remote ICD transmission.   

## 2019-06-22 ENCOUNTER — Ambulatory Visit (INDEPENDENT_AMBULATORY_CARE_PROVIDER_SITE_OTHER): Payer: Medicare Other

## 2019-06-22 DIAGNOSIS — I5022 Chronic systolic (congestive) heart failure: Secondary | ICD-10-CM | POA: Diagnosis not present

## 2019-06-22 DIAGNOSIS — Z95810 Presence of automatic (implantable) cardiac defibrillator: Secondary | ICD-10-CM | POA: Diagnosis not present

## 2019-06-23 NOTE — Progress Notes (Signed)
EPIC Encounter for ICM Monitoring  Patient Name: Jamariya Davidoff is a 63 y.o. female Date: 06/23/2019 Primary Care Physican: Perrin Maltese, MD Primary Cardiologist: Caryl Comes Electrophysiologist: Vergie Living Pacing: 98.3% Effective 98.2% BaselineWeight:171lbs        Attempted call to patient and unable to reach.  Left detailed message per DPR regarding transmission. Transmission reviewed.   Optivol thoracic impedancenormal.  Prescribed:Furosemide 20 mg 1 tablet daily  Labs: 05/21/2018 Creatinine 0.87, BUN 17, Potassium 4.0, Sodium 138, EGFR >60  Recommendations:Left voice mail with ICM number and encouraged to call if experiencing any fluid symptoms.  Follow-up plan: ICM clinic phone appointment on10/03/2019.91 day device clinic remote transmission 08/21/2019.  Copy of ICM check sent to [place].   3 month ICM trend: 06/22/2019    1 Year ICM trend:       [name], RN 06/23/2019 11:57 AM

## 2019-08-04 ENCOUNTER — Ambulatory Visit (INDEPENDENT_AMBULATORY_CARE_PROVIDER_SITE_OTHER): Payer: Medicare Other

## 2019-08-04 DIAGNOSIS — Z95810 Presence of automatic (implantable) cardiac defibrillator: Secondary | ICD-10-CM

## 2019-08-04 DIAGNOSIS — I5022 Chronic systolic (congestive) heart failure: Secondary | ICD-10-CM

## 2019-08-07 NOTE — Progress Notes (Signed)
EPIC Encounter for ICM Monitoring  Patient Name: Margaret Hendricks is a 63 y.o. female Date: 08/07/2019 Primary Care Physican: Perrin Maltese, MD Primary Cardiologist: Caryl Comes Electrophysiologist: Vergie Living Pacing: 98.1% Effective 97.9% BaselineWeight:171lbs    Transmission reviewed.   Optivol thoracic impedancetrending just below baseline normal.  Prescribed:Furosemide 20 mg 1 tablet daily  Labs: 05/21/2018 Creatinine 0.87, BUN 17, Potassium 4.0, Sodium 138, EGFR >60  Recommendations: None  Follow-up plan: ICM clinic phone appointment on11/06/2019.91 day device clinic remote transmission 08/21/2019.  Copy of ICM check sent to [place].   3 month ICM trend: 08/04/2019    1 Year ICM trend:       [name], RN 08/07/2019 3:33 PM

## 2019-08-21 ENCOUNTER — Ambulatory Visit (INDEPENDENT_AMBULATORY_CARE_PROVIDER_SITE_OTHER): Payer: Medicare Other | Admitting: *Deleted

## 2019-08-21 DIAGNOSIS — I447 Left bundle-branch block, unspecified: Secondary | ICD-10-CM

## 2019-08-21 DIAGNOSIS — I428 Other cardiomyopathies: Secondary | ICD-10-CM

## 2019-08-23 LAB — CUP PACEART REMOTE DEVICE CHECK
Battery Remaining Longevity: 61 mo
Battery Voltage: 2.98 V
Brady Statistic AP VP Percent: 10.04 %
Brady Statistic AP VS Percent: 0.25 %
Brady Statistic AS VP Percent: 88.24 %
Brady Statistic AS VS Percent: 1.46 %
Brady Statistic RA Percent Paced: 10.06 %
Brady Statistic RV Percent Paced: 1.67 %
Date Time Interrogation Session: 20201023041604
HighPow Impedance: 69 Ohm
Implantable Lead Implant Date: 20170918
Implantable Lead Implant Date: 20170918
Implantable Lead Implant Date: 20170918
Implantable Lead Location: 753858
Implantable Lead Location: 753859
Implantable Lead Location: 753860
Implantable Lead Model: 5076
Implantable Pulse Generator Implant Date: 20170918
Lead Channel Impedance Value: 1007 Ohm
Lead Channel Impedance Value: 1007 Ohm
Lead Channel Impedance Value: 1007 Ohm
Lead Channel Impedance Value: 1045 Ohm
Lead Channel Impedance Value: 1064 Ohm
Lead Channel Impedance Value: 1102 Ohm
Lead Channel Impedance Value: 270 Ohm
Lead Channel Impedance Value: 278.237
Lead Channel Impedance Value: 289.597
Lead Channel Impedance Value: 294.194
Lead Channel Impedance Value: 306.923
Lead Channel Impedance Value: 342 Ohm
Lead Channel Impedance Value: 437 Ohm
Lead Channel Impedance Value: 456 Ohm
Lead Channel Impedance Value: 513 Ohm
Lead Channel Impedance Value: 570 Ohm
Lead Channel Impedance Value: 608 Ohm
Lead Channel Impedance Value: 665 Ohm
Lead Channel Pacing Threshold Amplitude: 0.625 V
Lead Channel Pacing Threshold Amplitude: 0.625 V
Lead Channel Pacing Threshold Amplitude: 2 V
Lead Channel Pacing Threshold Pulse Width: 0.4 ms
Lead Channel Pacing Threshold Pulse Width: 0.4 ms
Lead Channel Pacing Threshold Pulse Width: 0.8 ms
Lead Channel Sensing Intrinsic Amplitude: 10.625 mV
Lead Channel Sensing Intrinsic Amplitude: 10.625 mV
Lead Channel Sensing Intrinsic Amplitude: 3.125 mV
Lead Channel Sensing Intrinsic Amplitude: 3.125 mV
Lead Channel Setting Pacing Amplitude: 2 V
Lead Channel Setting Pacing Amplitude: 2.5 V
Lead Channel Setting Pacing Amplitude: 3 V
Lead Channel Setting Pacing Pulse Width: 0.4 ms
Lead Channel Setting Pacing Pulse Width: 0.8 ms
Lead Channel Setting Sensing Sensitivity: 0.3 mV

## 2019-08-28 NOTE — Progress Notes (Signed)
Remote ICD transmission.   

## 2019-09-07 ENCOUNTER — Ambulatory Visit: Payer: Medicare Other

## 2019-09-07 DIAGNOSIS — Z95810 Presence of automatic (implantable) cardiac defibrillator: Secondary | ICD-10-CM

## 2019-09-07 DIAGNOSIS — I5022 Chronic systolic (congestive) heart failure: Secondary | ICD-10-CM

## 2019-09-08 NOTE — Progress Notes (Signed)
EPIC Encounter for ICM Monitoring  Patient Name: Margaret Hendricks is a 63 y.o. female Date: 09/08/2019 Primary Care Physican: Perrin Maltese, MD Primary Cardiologist: Caryl Comes Electrophysiologist: Vergie Living Pacing: 98.3% Effective 98.2% 11/10/2020Weight:171lbs    Spoke with patient and she is asymptomatic.   Optivol thoracic impedancetrending just below baseline normal since 09/01/2019.  Prescribed:Furosemide 20 mg 1 tablet daily  Labs: 05/21/2018 Creatinine 0.87, BUN 17, Potassium 4.0, Sodium 138, EGFR >60  Recommendations:   Follow-up plan: ICM clinic phone appointment on 09/15/2019 and do not need to call her back if returned to normal.   91 day device clinic remote transmission 11/20/2019.    Copy of ICM check sent to Dr. Caryl Comes.   3 month ICM trend: 09/07/2019    1 Year ICM trend:        Margaret Billings, RN 09/08/2019 1:34 PM

## 2019-09-15 ENCOUNTER — Ambulatory Visit (INDEPENDENT_AMBULATORY_CARE_PROVIDER_SITE_OTHER): Payer: Medicare Other

## 2019-09-15 DIAGNOSIS — I5022 Chronic systolic (congestive) heart failure: Secondary | ICD-10-CM

## 2019-09-15 DIAGNOSIS — Z95810 Presence of automatic (implantable) cardiac defibrillator: Secondary | ICD-10-CM

## 2019-09-18 NOTE — Progress Notes (Signed)
EPIC Encounter for ICM Monitoring  Patient Name: Margaret Hendricks is a 63 y.o. female Date: 09/18/2019 Primary Care Physican: Perrin Maltese, MD Primary Cardiologist: Caryl Comes Electrophysiologist: Vergie Living Pacing: 98.4% Effective 98.2% 11/10/2020Weight:171lbs    Transmission reviewed.  Optivol thoracic impedancereturned tonormal since 09/07/2019 remote transmission.  Prescribed:Furosemide 20 mg 1 tablet daily  Labs: 05/21/2018 Creatinine 0.87, BUN 17, Potassium 4.0, Sodium 138, EGFR >60  Recommendations: None  Follow-up plan: ICM clinic phone appointment on 10/12/2019.   91 day device clinic remote transmission 11/20/2019.    Copy of ICM check sent to Dr. Caryl Comes.   3 month ICM trend: 09/15/2019    1 Year ICM trend:       [name], RN 09/18/2019 11:49 AM

## 2019-10-12 ENCOUNTER — Ambulatory Visit (INDEPENDENT_AMBULATORY_CARE_PROVIDER_SITE_OTHER): Payer: Medicare Other

## 2019-10-12 DIAGNOSIS — Z95810 Presence of automatic (implantable) cardiac defibrillator: Secondary | ICD-10-CM | POA: Diagnosis not present

## 2019-10-12 DIAGNOSIS — I5022 Chronic systolic (congestive) heart failure: Secondary | ICD-10-CM | POA: Diagnosis not present

## 2019-10-13 ENCOUNTER — Telehealth: Payer: Self-pay

## 2019-10-13 NOTE — Telephone Encounter (Signed)
Remote ICM transmission received.  Attempted call to patient regarding ICM remote transmission and left detailed message per DPR.  Advised to return call for any fluid symptoms or questions. Next ICM remote transmission scheduled 10/03/2020.     

## 2019-10-13 NOTE — Progress Notes (Signed)
EPIC Encounter for ICM Monitoring  Patient Name: Margaret Hendricks is a 63 y.o. female Date: 10/13/2019 Primary Care Physican: Perrin Maltese, MD Primary Cardiologist: Caryl Comes Electrophysiologist: Vergie Living Pacing: 98.3% 11/10/2020Weight:171lbs    Attempted call to patient and unable to reach.  Left detailed message per DPR regarding transmission. Transmission reviewed.   Optivol thoracic impedancereturned tonormalsince 09/07/2019 remote transmission.  Prescribed:Furosemide 20 mg 1 tablet daily  Labs: 05/21/2018 Creatinine 0.87, BUN 17, Potassium 4.0, Sodium 138, EGFR >60  Recommendations:Left voice mail with ICM number and encouraged to call if experiencing any fluid symptoms.  Follow-up plan: ICM clinic phone appointment on1/26/2021. 91 day device clinic remote transmission 11/23/2019.   Copy of ICM check sent to [place].    3 month ICM trend: 10/12/2019    1 Year ICM trend:       [name], RN 10/13/2019 2:36 PM

## 2019-11-06 ENCOUNTER — Other Ambulatory Visit: Payer: Self-pay | Admitting: Internal Medicine

## 2019-11-06 DIAGNOSIS — Z1231 Encounter for screening mammogram for malignant neoplasm of breast: Secondary | ICD-10-CM

## 2019-11-23 ENCOUNTER — Ambulatory Visit (INDEPENDENT_AMBULATORY_CARE_PROVIDER_SITE_OTHER): Payer: Medicare HMO | Admitting: *Deleted

## 2019-11-23 DIAGNOSIS — I447 Left bundle-branch block, unspecified: Secondary | ICD-10-CM | POA: Diagnosis not present

## 2019-11-24 ENCOUNTER — Ambulatory Visit (INDEPENDENT_AMBULATORY_CARE_PROVIDER_SITE_OTHER): Payer: Medicare HMO

## 2019-11-24 DIAGNOSIS — I5022 Chronic systolic (congestive) heart failure: Secondary | ICD-10-CM | POA: Diagnosis not present

## 2019-11-24 DIAGNOSIS — Z95810 Presence of automatic (implantable) cardiac defibrillator: Secondary | ICD-10-CM

## 2019-11-24 LAB — CUP PACEART REMOTE DEVICE CHECK
Battery Remaining Longevity: 55 mo
Battery Voltage: 2.93 V
Brady Statistic AP VP Percent: 1.83 %
Brady Statistic AP VS Percent: 0.07 %
Brady Statistic AS VP Percent: 96.62 %
Brady Statistic AS VS Percent: 1.48 %
Brady Statistic RA Percent Paced: 1.9 %
Brady Statistic RV Percent Paced: 1.4 %
Date Time Interrogation Session: 20210126042206
HighPow Impedance: 73 Ohm
Implantable Lead Implant Date: 20170918
Implantable Lead Implant Date: 20170918
Implantable Lead Implant Date: 20170918
Implantable Lead Location: 753858
Implantable Lead Location: 753859
Implantable Lead Location: 753860
Implantable Lead Model: 5076
Implantable Pulse Generator Implant Date: 20170918
Lead Channel Impedance Value: 1007 Ohm
Lead Channel Impedance Value: 1007 Ohm
Lead Channel Impedance Value: 1045 Ohm
Lead Channel Impedance Value: 1045 Ohm
Lead Channel Impedance Value: 1064 Ohm
Lead Channel Impedance Value: 264.643
Lead Channel Impedance Value: 279.933
Lead Channel Impedance Value: 279.933
Lead Channel Impedance Value: 302.813
Lead Channel Impedance Value: 302.813
Lead Channel Impedance Value: 380 Ohm
Lead Channel Impedance Value: 399 Ohm
Lead Channel Impedance Value: 437 Ohm
Lead Channel Impedance Value: 494 Ohm
Lead Channel Impedance Value: 570 Ohm
Lead Channel Impedance Value: 646 Ohm
Lead Channel Impedance Value: 646 Ohm
Lead Channel Impedance Value: 988 Ohm
Lead Channel Pacing Threshold Amplitude: 0.625 V
Lead Channel Pacing Threshold Amplitude: 0.625 V
Lead Channel Pacing Threshold Amplitude: 2.375 V
Lead Channel Pacing Threshold Pulse Width: 0.4 ms
Lead Channel Pacing Threshold Pulse Width: 0.4 ms
Lead Channel Pacing Threshold Pulse Width: 0.8 ms
Lead Channel Sensing Intrinsic Amplitude: 3.625 mV
Lead Channel Sensing Intrinsic Amplitude: 3.625 mV
Lead Channel Sensing Intrinsic Amplitude: 9.75 mV
Lead Channel Sensing Intrinsic Amplitude: 9.75 mV
Lead Channel Setting Pacing Amplitude: 2 V
Lead Channel Setting Pacing Amplitude: 2.5 V
Lead Channel Setting Pacing Amplitude: 3.5 V
Lead Channel Setting Pacing Pulse Width: 0.4 ms
Lead Channel Setting Pacing Pulse Width: 0.8 ms
Lead Channel Setting Sensing Sensitivity: 0.3 mV

## 2019-11-25 ENCOUNTER — Encounter: Payer: Self-pay | Admitting: Internal Medicine

## 2019-11-30 NOTE — Progress Notes (Signed)
EPIC Encounter for ICM Monitoring  Patient Name: Margaret Hendricks is a 64 y.o. female Date: 11/30/2019 Primary Care Physican: Perrin Maltese, MD Primary Cardiologist: Caryl Comes Electrophysiologist: Vergie Living Pacing: 98.4% 11/10/2020Weight:171lbs  Time in AT/AF  <0.1 hr/day (<0.1%)     Transmission reviewed.   Optivol thoracic impedancenormal.  Prescribed:Furosemide 20 mg 1 tablet daily  Labs: 05/21/2018 Creatinine 0.87, BUN 17, Potassium 4.0, Sodium 138, EGFR >60  Recommendations: None  Follow-up plan: ICM clinic phone appointment on 12/28/2019.   91 day device clinic remote transmission 02/22/2020.  Office appt 01/12/2020 with Dr. Caryl Comes.    Copy of ICM check sent to Dr. Caryl Comes.   3 month ICM trend: 11/24/2019    1 Year ICM trend:       [name], RN 11/30/2019 11:23 AM

## 2019-12-09 ENCOUNTER — Ambulatory Visit
Admission: RE | Admit: 2019-12-09 | Discharge: 2019-12-09 | Disposition: A | Discharge status: Home / Self Care | Payer: Medicare HMO | Source: Ambulatory Visit | Attending: Internal Medicine | Admitting: Internal Medicine

## 2019-12-09 DIAGNOSIS — Z1231 Encounter for screening mammogram for malignant neoplasm of breast: Secondary | ICD-10-CM | POA: Diagnosis not present

## 2019-12-28 ENCOUNTER — Ambulatory Visit: Payer: Medicare HMO

## 2019-12-28 DIAGNOSIS — Z95810 Presence of automatic (implantable) cardiac defibrillator: Secondary | ICD-10-CM

## 2019-12-28 DIAGNOSIS — I5022 Chronic systolic (congestive) heart failure: Secondary | ICD-10-CM

## 2019-12-30 NOTE — Progress Notes (Signed)
EPIC Encounter for ICM Monitoring  Patient Name: Margaret Hendricks is a 64 y.o. female Date: 12/30/2019 Primary Care Physican: Perrin Maltese, MD Primary Cardiologist: Caryl Comes Electrophysiologist: Vergie Living Pacing: 98.3% LastWeight:171lbs  Time in AT/AF  <0.1 hr/day (<0.1%)     Attempted call to patient and unable to reach.  Left detailed message per DPR regarding transmission. Transmission reviewed.   Optivol thoracic impedancenormal but suggestive of possible fluid accumulation from 2/1 - 2/20.  Prescribed:Furosemide 20 mg 1 tablet daily  Labs: 05/21/2018 Creatinine 0.87, BUN 17, Potassium 4.0, Sodium 138, EGFR >60  Recommendations: Left voice mail with ICM number and encouraged to call if experiencing any fluid symptoms.  Follow-up plan: ICM clinic phone appointment on 02/01/2020.   91 day device clinic remote transmission 02/22/2020.  Office appt 01/12/2020 with Dr. Caryl Comes.    Copy of ICM check sent to Dr. Caryl Comes.   3 month ICM trend: 12/28/2019    1 Year ICM trend:       [name], RN 12/30/2019 5:02 PM

## 2020-01-12 ENCOUNTER — Ambulatory Visit (INDEPENDENT_AMBULATORY_CARE_PROVIDER_SITE_OTHER): Payer: Medicare HMO | Admitting: Internal Medicine

## 2020-01-12 ENCOUNTER — Encounter: Payer: Self-pay | Admitting: Internal Medicine

## 2020-01-12 ENCOUNTER — Telehealth: Payer: Self-pay | Admitting: Internal Medicine

## 2020-01-12 ENCOUNTER — Other Ambulatory Visit: Payer: Self-pay

## 2020-01-12 VITALS — BP 92/50 | HR 55 | Ht 63.0 in | Wt 178.0 lb

## 2020-01-12 DIAGNOSIS — I447 Left bundle-branch block, unspecified: Secondary | ICD-10-CM

## 2020-01-12 DIAGNOSIS — I428 Other cardiomyopathies: Secondary | ICD-10-CM

## 2020-01-12 DIAGNOSIS — Z95810 Presence of automatic (implantable) cardiac defibrillator: Secondary | ICD-10-CM | POA: Diagnosis not present

## 2020-01-12 DIAGNOSIS — I5022 Chronic systolic (congestive) heart failure: Secondary | ICD-10-CM

## 2020-01-12 LAB — CUP PACEART INCLINIC DEVICE CHECK
Battery Remaining Longevity: 60 mo
Battery Voltage: 2.97 V
Brady Statistic AP VP Percent: 2.2 %
Brady Statistic AP VS Percent: 0.08 %
Brady Statistic AS VP Percent: 96.28 %
Brady Statistic AS VS Percent: 1.44 %
Brady Statistic RA Percent Paced: 2.26 %
Brady Statistic RV Percent Paced: 1.54 %
Date Time Interrogation Session: 20210316104204
HighPow Impedance: 66 Ohm
Implantable Lead Implant Date: 20170918
Implantable Lead Implant Date: 20170918
Implantable Lead Implant Date: 20170918
Implantable Lead Location: 753858
Implantable Lead Location: 753859
Implantable Lead Location: 753860
Implantable Lead Model: 5076
Implantable Pulse Generator Implant Date: 20170918
Lead Channel Impedance Value: 1045 Ohm
Lead Channel Impedance Value: 1045 Ohm
Lead Channel Impedance Value: 1064 Ohm
Lead Channel Impedance Value: 1064 Ohm
Lead Channel Impedance Value: 1102 Ohm
Lead Channel Impedance Value: 264.643
Lead Channel Impedance Value: 272.552
Lead Channel Impedance Value: 279.933
Lead Channel Impedance Value: 294.194
Lead Channel Impedance Value: 302.813
Lead Channel Impedance Value: 380 Ohm
Lead Channel Impedance Value: 437 Ohm
Lead Channel Impedance Value: 494 Ohm
Lead Channel Impedance Value: 494 Ohm
Lead Channel Impedance Value: 570 Ohm
Lead Channel Impedance Value: 608 Ohm
Lead Channel Impedance Value: 646 Ohm
Lead Channel Impedance Value: 988 Ohm
Lead Channel Pacing Threshold Amplitude: 0.5 V
Lead Channel Pacing Threshold Amplitude: 0.5 V
Lead Channel Pacing Threshold Amplitude: 2 V
Lead Channel Pacing Threshold Pulse Width: 0.4 ms
Lead Channel Pacing Threshold Pulse Width: 0.4 ms
Lead Channel Pacing Threshold Pulse Width: 0.8 ms
Lead Channel Sensing Intrinsic Amplitude: 3.25 mV
Lead Channel Sensing Intrinsic Amplitude: 9.1 mV
Lead Channel Setting Pacing Amplitude: 2 V
Lead Channel Setting Pacing Amplitude: 2.5 V
Lead Channel Setting Pacing Amplitude: 2.5 V
Lead Channel Setting Pacing Pulse Width: 0.4 ms
Lead Channel Setting Pacing Pulse Width: 0.8 ms
Lead Channel Setting Sensing Sensitivity: 0.3 mV

## 2020-01-12 MED ORDER — CARVEDILOL 12.5 MG PO TABS: 12.5000 mg | ORAL_TABLET | Freq: Two times a day (BID) | ORAL | 3 refills | Status: DC

## 2020-01-12 NOTE — Telephone Encounter (Signed)
The patient was seen in clinic today.  She left the office before I could bring her her discharge paperwork. Attempted to call the patient to confirm the medication change Dr. Caryl Comes is making with her coreg- decreasing this to 12.5 mg BID. I wanted to clarify her pharmacy and if she wanted me to go ahead and send the new dose of Coreg in for her.

## 2020-01-12 NOTE — Patient Instructions (Addendum)
Medication Instructions:  - Your physician has recommended you make the following change in your medication:   1) Decrease coreg (carvedilol) to 12.5 mg- take 1 tablet by mouth twice daily  *If you need a refill on your cardiac medications before your next appointment, please call your pharmacy*   Lab Work: - none ordered  If you have labs (blood work) drawn today and your tests are completely normal, you will receive your results only by: [name] MyChart Message (if you have MyChart) OR . A paper copy in the mail If you have any lab test that is abnormal or we need to change your treatment, we will call you to review the results.   Testing/Procedures: - none ordered  Follow-Up: At [facility], you and your health needs are our priority.  As part of our continuing mission to provide you with exceptional heart care, we have created designated Provider Care Teams.  These Care Teams include your primary Cardiologist (physician) and Advanced Practice Providers (APPs -  Physician Assistants and Nurse Practitioners) who all work together to provide you with the care you need, when you need it.  We recommend signing up for the patient portal called "MyChart".  Sign up information is provided on this After Visit Summary.  MyChart is used to connect with patients for Virtual Visits (Telemedicine).  Patients are able to view lab/test results, encounter notes, upcoming appointments, etc.  Non-urgent messages can be sent to your provider as well.   To learn more about what you can do with MyChart, go to [email or web address].    Your next appointment:   1 year(s)  The format for your next appointment:   In Person  Provider:   Virl Axe, MD   Other Instructions n/a

## 2020-01-12 NOTE — Progress Notes (Signed)
Patient Care Team: Perrin Maltese, MD as PCP - General (Internal Medicine)   HPI  Margaret Hendricks is a 64 y.o. female Seen in follow-up for CRT-D implanted 9/17 for nonischemic cardiomyopathy and congestive heart failure   Not seen since 1217; but has been followed by [facility] clinic  The patient denies chest pain,  nocturnal dyspnea, orthopnea or peripheral edema.  There have been no palpitations, lightheadedness or syncope. Some shortness of breath    DATE TEST EF   8/17 Echo  25 %   12/17 Echo  56 %   2/21 Echo   76% LAE severe    Date Cr K Hgb  7/19 0.87 4.0 11.4   1/21   4.3 11.5     Past Medical History:  Diagnosis Date  . Abnormal heart rhythm   . AICD (automatic cardioverter/defibrillator) present   . Anemia   . Cancer ([place])    skin  . Chronic systolic CHF (congestive heart failure), NYHA class 2- 3 ([place]) 07/03/2016  . COPD (chronic obstructive pulmonary disease) ([place])   . Diabetes mellitus without complication ([place])   . History of colonic polyps   . LBBB (left bundle branch block) 07/03/2016  . NICM (nonischemic cardiomyopathy) ([place]) 07/03/2016  . Shortness of breath     Past Surgical History:  Procedure Laterality Date  . APPENDECTOMY    . CARDIAC CATHETERIZATION Left 03/14/2015   Procedure: Left Heart Cath;  Surgeon: Dionisio David, MD;  Location: [place] CV LAB;  Service: Cardiovascular;  Laterality: Left;  . COLONOSCOPY WITH PROPOFOL N/A 05/12/2018   Procedure: COLONOSCOPY WITH PROPOFOL;  Surgeon: Lollie Sails, MD;  Location: [facility] ENDOSCOPY;  Service: Endoscopy;  Laterality: N/A;  . EP IMPLANTABLE DEVICE N/A 07/16/2016   Procedure: BiV ICD Insertion CRT-D;  Surgeon: Deboraha Sprang, MD;  Location: [place] CV LAB;  Service: Cardiovascular;  Laterality: N/A;  . ESOPHAGOGASTRODUODENOSCOPY (EGD) WITH PROPOFOL N/A 05/12/2018   Procedure: ESOPHAGOGASTRODUODENOSCOPY (EGD) WITH PROPOFOL;  Surgeon: Lollie Sails, MD;  Location: [facility]  ENDOSCOPY;  Service: Endoscopy;  Laterality: N/A;  . ICD IMPLANT    . INSERT / REPLACE / REMOVE PACEMAKER    . TUBAL LIGATION Bilateral     Current Outpatient Medications  Medication Sig Dispense Refill  . calcium carbonate (OSCAL) 1500 (600 Ca) MG TABS tablet Take by mouth 2 (two) times daily with a meal.    . carvedilol (COREG) 25 MG tablet Take 25 mg by mouth 2 (two) times daily with a meal.    . cholecalciferol (VITAMIN D) 1000 units tablet Take 1,000 Units by mouth daily.    . cyclobenzaprine (FLEXERIL) 5 MG tablet Take 5 mg by mouth at bedtime.    . ferrous sulfate 325 (65 FE) MG tablet Take 325 mg by mouth 2 (two) times daily with a meal.    . furosemide (LASIX) 20 MG tablet Take 20 mg by mouth daily.    [name] ibuprofen (ADVIL) 200 MG tablet Take 200 mg by mouth every 6 (six) hours as needed.    . Menthol, Topical Analgesic, (BENGAY EX) Apply 1 application topically daily as needed. Back pain    . metFORMIN (GLUCOPHAGE) 500 MG tablet Take 500 mg by mouth 2 (two) times daily with a meal.    . Multiple Vitamins-Minerals (ICAPS AREDS FORMULA PO) Take 1 tablet by mouth daily.    . polyethylene glycol-electrolytes (NULYTELY/GOLYTELY) 420 g solution Take 4,000 mLs by mouth once.    [name]  sacubitril-valsartan (ENTRESTO) 97-103 MG Take 1 tablet by mouth 2 (two) times daily.    [name] spironolactone (ALDACTONE) 25 MG tablet Take 25 mg by mouth daily.     No current facility-administered medications for this visit.    Allergies  Allergen Reactions  . Tape Other (See Comments)    Adhesive-silicones      Review of Systems negative except from HPI and PMH  Physical Exam BP (!) 92/50 (BP Location: Left Arm, Patient Position: Sitting, Cuff Size: Normal)   Pulse (!) 55   Ht 5\' 3"  (1.6 m)   Wt 178 lb (80.7 kg)   SpO2 97%   BMI 31.53 kg/m  Well developed and well nourished in no acute distress HENT normal Neck supple with JVP-flat Clear Device pocket well healed; without hematoma or erythema.   There is no tethering  Regular rate and rhythm, no  murmur Abd-soft with active BS No Clubbing cyanosis * edema Skin-warm and dry A & Oriented  Grossly normal sensory and motor function  ECG  [id number] 15/15/46  Assessment and  Plan CRT-D-[employer] The patient's device was interrogated.  The information was reviewed. No changes were made in the programming.     Nonischemic cardiomyopathy  Left bundle branch block  Congestive heart failure-chronic-systolic class IIa  Hypotension-symptomatic with LH    Euvolemic continue current meds  Will decrease carvedilol given symptomatic hypotension  Continue entresto and aldactone for NICM-- may need further med adjustment depending on BP and LH

## 2020-01-12 NOTE — Telephone Encounter (Signed)
I spoke with the patient.  She is aware of Dr. Olin Pia recommendations to decrease her coreg to 12.5 mg BID. She would like this RX sent to the pharmacy. She is aware she does not need any labs today.

## 2020-01-26 ENCOUNTER — Other Ambulatory Visit: Payer: Self-pay | Admitting: Student

## 2020-01-26 DIAGNOSIS — K2100 Gastro-esophageal reflux disease with esophagitis, without bleeding: Secondary | ICD-10-CM

## 2020-01-26 DIAGNOSIS — K2951 Unspecified chronic gastritis with bleeding: Secondary | ICD-10-CM

## 2020-01-26 DIAGNOSIS — R1319 Other dysphagia: Secondary | ICD-10-CM

## 2020-01-26 DIAGNOSIS — R1310 Dysphagia, unspecified: Secondary | ICD-10-CM

## 2020-01-26 DIAGNOSIS — K2980 Duodenitis without bleeding: Secondary | ICD-10-CM

## 2020-01-29 ENCOUNTER — Ambulatory Visit: Payer: Medicare HMO | Attending: Internal Medicine

## 2020-01-29 DIAGNOSIS — Z23 Encounter for immunization: Secondary | ICD-10-CM

## 2020-01-29 NOTE — Progress Notes (Signed)
   Covid-19 Vaccination Clinic  Name:  Meghana Kriley    MRN: [id number] DOB: [birth year]  01/29/2020  Ms. Keelin was observed post Covid-19 immunization for 15 minutes without incident. She was provided with Vaccine Information Sheet and instruction to access the V-Safe system.   Ms. Moghadam was instructed to call 911 with any severe reactions post vaccine: [name] Difficulty breathing  . Swelling of face and throat  . A fast heartbeat  . A bad rash all over body  . Dizziness and weakness   Immunizations Administered    Name Date Dose VIS Date Route   Pfizer COVID-19 Vaccine 01/29/2020  9:12 AM 0.3 mL 10/09/2019 Intramuscular   Manufacturer: [place]   Lot: [id number]   [place]: [id number]

## 2020-02-01 ENCOUNTER — Ambulatory Visit (INDEPENDENT_AMBULATORY_CARE_PROVIDER_SITE_OTHER): Payer: Medicare HMO

## 2020-02-01 DIAGNOSIS — I5022 Chronic systolic (congestive) heart failure: Secondary | ICD-10-CM

## 2020-02-01 DIAGNOSIS — Z95810 Presence of automatic (implantable) cardiac defibrillator: Secondary | ICD-10-CM | POA: Diagnosis not present

## 2020-02-02 ENCOUNTER — Ambulatory Visit
Admission: RE | Admit: 2020-02-02 | Discharge: 2020-02-02 | Disposition: A | Discharge status: Home / Self Care | Payer: Medicare HMO | Source: Ambulatory Visit | Attending: Student | Admitting: Student

## 2020-02-02 ENCOUNTER — Other Ambulatory Visit: Payer: Self-pay

## 2020-02-02 DIAGNOSIS — R1310 Dysphagia, unspecified: Secondary | ICD-10-CM | POA: Diagnosis not present

## 2020-02-02 DIAGNOSIS — K2980 Duodenitis without bleeding: Secondary | ICD-10-CM | POA: Insufficient documentation

## 2020-02-02 DIAGNOSIS — K2951 Unspecified chronic gastritis with bleeding: Secondary | ICD-10-CM

## 2020-02-02 DIAGNOSIS — K2100 Gastro-esophageal reflux disease with esophagitis, without bleeding: Secondary | ICD-10-CM | POA: Insufficient documentation

## 2020-02-02 DIAGNOSIS — R1319 Other dysphagia: Secondary | ICD-10-CM

## 2020-02-03 NOTE — Progress Notes (Signed)
EPIC Encounter for ICM Monitoring  Patient Name: Margaret Hendricks is a 64 y.o. female Date: 02/03/2020 Primary Care Physican: Margaret Maltese, MD Primary Cardiologist: Margaret Hendricks Electrophysiologist: Margaret Hendricks Pacing: 98.3% LastWeight:171lbs  Time in AT/AF  0.0 hr/day (0.0%)    Transmission reviewed.   Optivol thoracic impedancenormal.  Prescribed:Furosemide 20 mg 1 tablet daily  Labs: 05/21/2018 Creatinine 0.87, BUN 17, Potassium 4.0, Sodium 138, EGFR >60  Recommendations:None.  Follow-up plan: ICM clinic phone appointment on5/08/2020. 91 day device clinic remote transmission 02/22/2020.   Copy of ICM check sent to [place].   3 month ICM trend: 02/01/2020    1 Year ICM trend:       Margaret Billings, RN 02/03/2020 10:13 AM

## 2020-02-22 ENCOUNTER — Ambulatory Visit (INDEPENDENT_AMBULATORY_CARE_PROVIDER_SITE_OTHER): Payer: Medicare HMO | Admitting: *Deleted

## 2020-02-22 DIAGNOSIS — I447 Left bundle-branch block, unspecified: Secondary | ICD-10-CM

## 2020-02-22 LAB — CUP PACEART REMOTE DEVICE CHECK
Battery Remaining Longevity: 61 mo
Battery Voltage: 2.98 V
Brady Statistic AP VP Percent: 2.1 %
Brady Statistic AP VS Percent: 0.08 %
Brady Statistic AS VP Percent: 96.36 %
Brady Statistic AS VS Percent: 1.46 %
Brady Statistic RA Percent Paced: 2.18 %
Brady Statistic RV Percent Paced: 2.18 %
Date Time Interrogation Session: 20210426022824
HighPow Impedance: 68 Ohm
Implantable Lead Implant Date: 20170918
Implantable Lead Implant Date: 20170918
Implantable Lead Implant Date: 20170918
Implantable Lead Location: 753858
Implantable Lead Location: 753859
Implantable Lead Location: 753860
Implantable Lead Model: 5076
Implantable Pulse Generator Implant Date: 20170918
Lead Channel Impedance Value: 1007 Ohm
Lead Channel Impedance Value: 1045 Ohm
Lead Channel Impedance Value: 1045 Ohm
Lead Channel Impedance Value: 1064 Ohm
Lead Channel Impedance Value: 1102 Ohm
Lead Channel Impedance Value: 1102 Ohm
Lead Channel Impedance Value: 264.643
Lead Channel Impedance Value: 279.933
Lead Channel Impedance Value: 283.443
Lead Channel Impedance Value: 302.813
Lead Channel Impedance Value: 306.923
Lead Channel Impedance Value: 380 Ohm
Lead Channel Impedance Value: 399 Ohm
Lead Channel Impedance Value: 456 Ohm
Lead Channel Impedance Value: 494 Ohm
Lead Channel Impedance Value: 570 Ohm
Lead Channel Impedance Value: 646 Ohm
Lead Channel Impedance Value: 665 Ohm
Lead Channel Pacing Threshold Amplitude: 0.5 V
Lead Channel Pacing Threshold Amplitude: 0.625 V
Lead Channel Pacing Threshold Amplitude: 1.875 V
Lead Channel Pacing Threshold Pulse Width: 0.4 ms
Lead Channel Pacing Threshold Pulse Width: 0.4 ms
Lead Channel Pacing Threshold Pulse Width: 0.8 ms
Lead Channel Sensing Intrinsic Amplitude: 2.875 mV
Lead Channel Sensing Intrinsic Amplitude: 2.875 mV
Lead Channel Sensing Intrinsic Amplitude: 9.375 mV
Lead Channel Sensing Intrinsic Amplitude: 9.375 mV
Lead Channel Setting Pacing Amplitude: 2 V
Lead Channel Setting Pacing Amplitude: 2.5 V
Lead Channel Setting Pacing Amplitude: 2.5 V
Lead Channel Setting Pacing Pulse Width: 0.4 ms
Lead Channel Setting Pacing Pulse Width: 0.8 ms
Lead Channel Setting Sensing Sensitivity: 0.3 mV

## 2020-02-22 NOTE — Progress Notes (Signed)
ICD Remote  

## 2020-02-23 ENCOUNTER — Ambulatory Visit: Payer: Medicare HMO | Attending: Internal Medicine

## 2020-02-23 DIAGNOSIS — Z23 Encounter for immunization: Secondary | ICD-10-CM

## 2020-02-23 NOTE — Progress Notes (Signed)
   Covid-19 Vaccination Clinic  Name:  Margaret Hendricks    MRN: [id number] DOB: 02-27-1956  02/23/2020  Margaret Hendricks was observed post Covid-19 immunization for 15 minutes without incident. She was provided with Vaccine Information Sheet and instruction to access the V-Safe system.   Margaret Hendricks was instructed to call 911 with any severe reactions post vaccine: [name] Difficulty breathing  . Swelling of face and throat  . A fast heartbeat  . A bad rash all over body  . Dizziness and weakness   Immunizations Administered    Name Date Dose VIS Date Route   Pfizer COVID-19 Vaccine 02/23/2020 11:15 AM 0.3 mL 12/23/2018 Intramuscular   Manufacturer: [place]   Lot: [id number]   [place]: [id number]

## 2020-03-05 ENCOUNTER — Ambulatory Visit: Payer: Medicare HMO

## 2020-03-08 ENCOUNTER — Ambulatory Visit (INDEPENDENT_AMBULATORY_CARE_PROVIDER_SITE_OTHER): Payer: Medicare HMO

## 2020-03-08 DIAGNOSIS — Z95810 Presence of automatic (implantable) cardiac defibrillator: Secondary | ICD-10-CM | POA: Diagnosis not present

## 2020-03-08 DIAGNOSIS — I5022 Chronic systolic (congestive) heart failure: Secondary | ICD-10-CM

## 2020-03-11 NOTE — Progress Notes (Signed)
EPIC Encounter for ICM Monitoring  Patient Name: Margaret Hendricks is a 64 y.o. female Date: 03/11/2020 Primary Care Physican: Perrin Maltese, MD Primary Cardiologist: Caryl Comes Electrophysiologist: Vergie Living Pacing: 98.4% LastWeight:171lbs  Time in AT/AF              0.0 hr/day (0.0%)    Transmission reviewed.  Optivol thoracic impedancenormal.  Prescribed:Furosemide 20 mg 1 tablet daily  Labs: 05/21/2018 Creatinine 0.87, BUN 17, Potassium 4.0, Sodium 138, EGFR >60  Recommendations:None  Follow-up plan: ICM clinic phone appointment on6/15/2021. 91 day device clinic remote transmission 05/23/2020.   Copy of ICM check sent to [place].  3 month ICM trend: 03/11/2020    1 Year ICM trend:       Margaret Billings, RN 03/11/2020 11:19 AM

## 2020-04-12 ENCOUNTER — Ambulatory Visit (INDEPENDENT_AMBULATORY_CARE_PROVIDER_SITE_OTHER): Payer: Medicare HMO

## 2020-04-12 DIAGNOSIS — I5022 Chronic systolic (congestive) heart failure: Secondary | ICD-10-CM | POA: Diagnosis not present

## 2020-04-12 DIAGNOSIS — Z95810 Presence of automatic (implantable) cardiac defibrillator: Secondary | ICD-10-CM

## 2020-04-15 ENCOUNTER — Telehealth: Payer: Self-pay

## 2020-04-15 NOTE — Progress Notes (Signed)
EPIC Encounter for ICM Monitoring  Patient Name: Margaret Hendricks is a 64 y.o. female Date: 04/15/2020 Primary Care Physican: Perrin Maltese, MD Primary Cardiologist: Caryl Comes Electrophysiologist: Vergie Living Pacing: 98.4% LastWeight:171lbs  Time in AT/AF  <0.1 hr/day (<0.1%)    Attempted call to patient and unable to reach.  Left detailed message per DPR regarding transmission. Transmission reviewed.   Optivol thoracic impedancetrending slightly below baseline normal.  Prescribed:Furosemide 20 mg 1 tablet daily  Labs: 05/21/2018 Creatinine 0.87, BUN 17, Potassium 4.0, Sodium 138, EGFR >60  Recommendations:Left voice mail with ICM number and encouraged to call if experiencing any fluid symptoms.  Follow-up plan: ICM clinic phone appointment on7/19/2021. 91 day device clinic remote transmission 05/23/2020.   Copy of ICM check sent to [place].  3 month ICM trend: 04/12/2020    1 Year ICM trend:       [name], RN 04/15/2020 2:48 PM

## 2020-04-15 NOTE — Telephone Encounter (Signed)
Remote ICM transmission received.  Attempted call to patient regarding ICM remote transmission and left detailed message per DPR.  Advised to return call for any fluid symptoms or questions.  

## 2020-05-16 ENCOUNTER — Ambulatory Visit (INDEPENDENT_AMBULATORY_CARE_PROVIDER_SITE_OTHER): Payer: Medicare HMO

## 2020-05-16 DIAGNOSIS — I5022 Chronic systolic (congestive) heart failure: Secondary | ICD-10-CM | POA: Diagnosis not present

## 2020-05-16 DIAGNOSIS — Z95810 Presence of automatic (implantable) cardiac defibrillator: Secondary | ICD-10-CM | POA: Diagnosis not present

## 2020-05-18 NOTE — Progress Notes (Signed)
EPIC Encounter for ICM Monitoring  Patient Name: Margaret Hendricks is a 64 y.o. female Date: 05/18/2020 Primary Care Physican: Perrin Maltese, MD Primary Cardiologist: Caryl Comes Electrophysiologist: Vergie Living Pacing: 98.4% LastWeight:171lbs  Time in AT/AF:  0.0 hr/day (0.0%)   Transmission reviewed.   Optivol thoracic impedancetrending slightly below baseline normal.  Prescribed:Furosemide 20 mg 1 tablet daily  Labs: 05/21/2018 Creatinine 0.87, BUN 17, Potassium 4.0, Sodium 138, EGFR >60  Recommendations:None  Follow-up plan: ICM clinic phone appointment on 06/20/2020. 91 day device clinic remote transmission7/26/2021.   EP/Cardiology Office Visits: Recall 01/11/2021 with Dr. Caryl Comes.    Copy of ICM check sent to Dr. Caryl Comes.   3 month ICM trend: 05/16/2020    1 Year ICM trend:       [name], RN 05/18/2020 12:15 PM

## 2020-05-23 ENCOUNTER — Ambulatory Visit (INDEPENDENT_AMBULATORY_CARE_PROVIDER_SITE_OTHER): Payer: Medicare HMO | Admitting: *Deleted

## 2020-05-23 DIAGNOSIS — I5022 Chronic systolic (congestive) heart failure: Secondary | ICD-10-CM | POA: Diagnosis not present

## 2020-05-23 DIAGNOSIS — I428 Other cardiomyopathies: Secondary | ICD-10-CM

## 2020-05-23 LAB — CUP PACEART REMOTE DEVICE CHECK
Battery Remaining Longevity: 57 mo
Battery Voltage: 2.97 V
Brady Statistic AP VP Percent: 4.22 %
Brady Statistic AP VS Percent: 0.13 %
Brady Statistic AS VP Percent: 94.16 %
Brady Statistic AS VS Percent: 1.49 %
Brady Statistic RA Percent Paced: 4.35 %
Brady Statistic RV Percent Paced: 0.41 %
Date Time Interrogation Session: 20210726022823
HighPow Impedance: 67 Ohm
Implantable Lead Implant Date: 20170918
Implantable Lead Implant Date: 20170918
Implantable Lead Implant Date: 20170918
Implantable Lead Location: 753858
Implantable Lead Location: 753859
Implantable Lead Location: 753860
Implantable Lead Model: 5076
Implantable Pulse Generator Implant Date: 20170918
Lead Channel Impedance Value: 1007 Ohm
Lead Channel Impedance Value: 1064 Ohm
Lead Channel Impedance Value: 1102 Ohm
Lead Channel Impedance Value: 1121 Ohm
Lead Channel Impedance Value: 1121 Ohm
Lead Channel Impedance Value: 1159 Ohm
Lead Channel Impedance Value: 289.049
Lead Channel Impedance Value: 301.328
Lead Channel Impedance Value: 312.507
Lead Channel Impedance Value: 317.612
Lead Channel Impedance Value: 330.057
Lead Channel Impedance Value: 380 Ohm
Lead Channel Impedance Value: 437 Ohm
Lead Channel Impedance Value: 513 Ohm
Lead Channel Impedance Value: 551 Ohm
Lead Channel Impedance Value: 608 Ohm
Lead Channel Impedance Value: 665 Ohm
Lead Channel Impedance Value: 722 Ohm
Lead Channel Pacing Threshold Amplitude: 0.625 V
Lead Channel Pacing Threshold Amplitude: 0.625 V
Lead Channel Pacing Threshold Amplitude: 2 V
Lead Channel Pacing Threshold Pulse Width: 0.4 ms
Lead Channel Pacing Threshold Pulse Width: 0.4 ms
Lead Channel Pacing Threshold Pulse Width: 0.8 ms
Lead Channel Sensing Intrinsic Amplitude: 3.5 mV
Lead Channel Sensing Intrinsic Amplitude: 3.5 mV
Lead Channel Sensing Intrinsic Amplitude: 8.5 mV
Lead Channel Sensing Intrinsic Amplitude: 8.5 mV
Lead Channel Setting Pacing Amplitude: 2 V
Lead Channel Setting Pacing Amplitude: 2.5 V
Lead Channel Setting Pacing Amplitude: 2.5 V
Lead Channel Setting Pacing Pulse Width: 0.4 ms
Lead Channel Setting Pacing Pulse Width: 0.8 ms
Lead Channel Setting Sensing Sensitivity: 0.3 mV

## 2020-05-24 NOTE — Progress Notes (Signed)
Remote ICD transmission.   

## 2020-05-28 ENCOUNTER — Other Ambulatory Visit: Payer: Self-pay

## 2020-05-28 ENCOUNTER — Emergency Department
Admission: EM | Admit: 2020-05-28 | Discharge: 2020-05-28 | Disposition: A | Discharge status: Home / Self Care | Payer: Medicare HMO | Attending: Emergency Medicine | Admitting: Emergency Medicine

## 2020-05-28 DIAGNOSIS — F17210 Nicotine dependence, cigarettes, uncomplicated: Secondary | ICD-10-CM | POA: Diagnosis not present

## 2020-05-28 DIAGNOSIS — E119 Type 2 diabetes mellitus without complications: Secondary | ICD-10-CM | POA: Insufficient documentation

## 2020-05-28 DIAGNOSIS — I5022 Chronic systolic (congestive) heart failure: Secondary | ICD-10-CM | POA: Diagnosis not present

## 2020-05-28 DIAGNOSIS — J449 Chronic obstructive pulmonary disease, unspecified: Secondary | ICD-10-CM | POA: Diagnosis not present

## 2020-05-28 DIAGNOSIS — Z950 Presence of cardiac pacemaker: Secondary | ICD-10-CM | POA: Insufficient documentation

## 2020-05-28 DIAGNOSIS — Z7984 Long term (current) use of oral hypoglycemic drugs: Secondary | ICD-10-CM | POA: Diagnosis not present

## 2020-05-28 DIAGNOSIS — Y929 Unspecified place or not applicable: Secondary | ICD-10-CM | POA: Diagnosis not present

## 2020-05-28 DIAGNOSIS — Y999 Unspecified external cause status: Secondary | ICD-10-CM | POA: Insufficient documentation

## 2020-05-28 DIAGNOSIS — S80861A Insect bite (nonvenomous), right lower leg, initial encounter: Secondary | ICD-10-CM | POA: Diagnosis not present

## 2020-05-28 DIAGNOSIS — Y939 Activity, unspecified: Secondary | ICD-10-CM | POA: Insufficient documentation

## 2020-05-28 DIAGNOSIS — W57XXXA Bitten or stung by nonvenomous insect and other nonvenomous arthropods, initial encounter: Secondary | ICD-10-CM | POA: Insufficient documentation

## 2020-05-28 MED ORDER — SULFAMETHOXAZOLE-TRIMETHOPRIM 800-160 MG PO TABS: 1.0000 | ORAL_TABLET | Freq: Two times a day (BID) | ORAL | 0 refills | Status: DC

## 2020-05-28 MED ORDER — LIDOCAINE 5 % EX PTCH
1.0000 | MEDICATED_PATCH | CUTANEOUS | Status: DC
  Administered 2020-05-28: 1 via TRANSDERMAL
  Filled 2020-05-28: qty 1

## 2020-05-28 MED ORDER — LIDOCAINE 5 % EX PTCH: 1.0000 | MEDICATED_PATCH | Freq: Two times a day (BID) | CUTANEOUS | 0 refills | Status: AC

## 2020-05-28 MED ORDER — NAPROXEN 500 MG PO TABS: 500.0000 mg | ORAL_TABLET | Freq: Two times a day (BID) | ORAL | Status: DC

## 2020-05-28 NOTE — Discharge Instructions (Signed)
Follow discharge care instruction take medication as directed. °

## 2020-05-28 NOTE — ED Notes (Signed)
See triage note  Presents with possible insect bite to right posterior lower leg  States she felt an itch to area on Tuesday  Now area is red and slightly swollen

## 2020-05-28 NOTE — ED Triage Notes (Signed)
Pt arrives via POV for possible insect bite on right calf. Pt reports area started itching Tuesday and area is now red and scabbed. Denies drainage, fever. PT ambulatory from lobby with steady gait, skin warm and dry.

## 2020-06-04 NOTE — ED Provider Notes (Signed)
[facility] Emergency Department Provider Note   ____________________________________________   First MD Initiated Contact with Patient 05/28/20 1338     (approximate)  I have reviewed the triage vital signs and the nursing notes.   HISTORY  Chief Complaint Insect Bite    HPI Margaret Hendricks is a 63 y.o. female patient presents with insect bite to the right calf.  Patient stated earlier initially was itchy but is now red scabbed over.  Patient denies drainage or fever.  Patient unsure what type of insect bite.  Patient rates her pain as a 4/10.  Patient described the pain is "achy".  No palliative measure for complaint.         Past Medical History:  Diagnosis Date  . Abnormal heart rhythm   . AICD (automatic cardioverter/defibrillator) present   . Anemia   . Cancer ([place])    skin  . Chronic systolic CHF (congestive heart failure), NYHA class 2- 3 ([place]) 07/03/2016  . COPD (chronic obstructive pulmonary disease) ([place])   . Diabetes mellitus without complication ([place])   . History of colonic polyps   . LBBB (left bundle branch block) 07/03/2016  . NICM (nonischemic cardiomyopathy) ([place]) 07/03/2016  . Shortness of breath     Patient Active Problem List   Diagnosis Date Noted  . Anemia of chronic disease 05/21/2017  . MGUS (monoclonal gammopathy of unknown significance) 05/21/2017  . LBBB (left bundle branch block) 07/03/2016  . NICM (nonischemic cardiomyopathy) ([place]) 07/03/2016  . Chronic systolic CHF (congestive heart failure), NYHA class 2- 3 ([place]) 07/03/2016  . COPD, moderate ([place]) 03/12/2015  . Tobacco abuse 01/04/2015    Past Surgical History:  Procedure Laterality Date  . APPENDECTOMY    . CARDIAC CATHETERIZATION Left 03/14/2015   Procedure: Left Heart Cath;  Surgeon: Dionisio David, MD;  Location: [facility];  Service: Cardiovascular;  Laterality: Left;  . COLONOSCOPY WITH PROPOFOL N/A 05/12/2018   Procedure: COLONOSCOPY  WITH PROPOFOL;  Surgeon: Lollie Sails, MD;  Location: [facility] ENDOSCOPY;  Service: Endoscopy;  Laterality: N/A;  . EP IMPLANTABLE DEVICE N/A 07/16/2016   Procedure: BiV ICD Insertion CRT-D;  Surgeon: Deboraha Sprang, MD;  Location: [facility];  Service: Cardiovascular;  Laterality: N/A;  . ESOPHAGOGASTRODUODENOSCOPY (EGD) WITH PROPOFOL N/A 05/12/2018   Procedure: ESOPHAGOGASTRODUODENOSCOPY (EGD) WITH PROPOFOL;  Surgeon: Lollie Sails, MD;  Location: [facility] ENDOSCOPY;  Service: Endoscopy;  Laterality: N/A;  . ICD IMPLANT    . INSERT / REPLACE / REMOVE PACEMAKER    . TUBAL LIGATION Bilateral     Prior to Admission medications   Medication Sig Start Date End Date Taking? Authorizing Provider  calcium carbonate (OSCAL) 1500 (600 Ca) MG TABS tablet Take by mouth 2 (two) times daily with a meal.    [provider]  carvedilol (COREG) 12.5 MG tablet Take 1 tablet (12.5 mg total) by mouth 2 (two) times daily. 01/12/20 04/11/20  Deboraha Sprang, MD  cholecalciferol (VITAMIN D) 1000 units tablet Take 1,000 Units by mouth daily.    [provider]  cyclobenzaprine (FLEXERIL) 5 MG tablet Take 5 mg by mouth at bedtime.    [provider]  ferrous sulfate 325 (65 FE) MG tablet Take 325 mg by mouth 2 (two) times daily with a meal.    [provider]  furosemide (LASIX) 20 MG tablet Take 20 mg by mouth daily.    [provider]  ibuprofen (ADVIL) 200 MG tablet Take  200 mg by mouth every 6 (six) hours as needed.    [provider]  lidocaine (LIDODERM) 5 % Place 1 patch onto the skin every 12 (twelve) hours. Remove & Discard patch within 12 hours or as directed by MD 05/28/20 05/28/21  Sable Feil, PA-C  Menthol, Topical Analgesic, (BENGAY EX) Apply 1 application topically daily as needed. Back pain    [provider]  metFORMIN (GLUCOPHAGE) 500 MG tablet Take 500 mg by mouth 2 (two) times daily with a meal.    [provider]    Multiple Vitamins-Minerals (ICAPS AREDS FORMULA PO) Take 1 tablet by mouth daily.    [provider]  naproxen (NAPROSYN) 500 MG tablet Take 1 tablet (500 mg total) by mouth 2 (two) times daily with a meal. 05/28/20   Sable Feil, PA-C  polyethylene glycol-electrolytes (NULYTELY/GOLYTELY) 420 g solution Take 4,000 mLs by mouth once.    [provider]  sacubitril-valsartan (ENTRESTO) 97-103 MG Take 1 tablet by mouth 2 (two) times daily.    [provider]  spironolactone (ALDACTONE) 25 MG tablet Take 25 mg by mouth daily.    [provider]  sulfamethoxazole-trimethoprim (BACTRIM DS) 800-160 MG tablet Take 1 tablet by mouth 2 (two) times daily. 05/28/20   Sable Feil, PA-C    Allergies Tape  Family History  Problem Relation Age of Onset  . Hypertension Other   . Heart attack Other   . Breast cancer Neg Hx     Social History Social History   Tobacco Use  . Smoking status: Current Every Day Smoker    Packs/day: 0.10    Years: 30.00    Pack years: 3.00    Types: Cigarettes  . Smokeless tobacco: Never Used  . Tobacco comment: Margaret Hendricks admits she smokes 1/2 cigarette in am on the way to work and 1/2 cigarette on the way home from work.   Vaping Use  . Vaping Use: Never used  Substance Use Topics  . Alcohol use: No    Alcohol/week: 0.0 standard drinks  . Drug use: Yes    Types: Marijuana    Review of Systems Constitutional: No fever/chills Eyes: No visual changes. ENT: No sore throat. Cardiovascular: Denies chest pain. Respiratory: Denies shortness of breath. Gastrointestinal: No abdominal pain.  No nausea, no vomiting.  No diarrhea.  No constipation. Genitourinary: Negative for dysuria. Musculoskeletal: Negative for back pain. Skin: Negative for rash.  Edema and erythema right calf.   Neurological: Negative for headaches, focal weakness or numbness. Endocrine:  Diabetes. Allergic/Immunilogical: Adhesive  tape. ____________________________________________   PHYSICAL EXAM:  VITAL SIGNS: ED Triage Vitals  Enc Vitals Group     BP 05/28/20 1300 (!) 133/59     Pulse Rate 05/28/20 1300 58     Resp 05/28/20 1300 18     Temp 05/28/20 1300 98.4 F (36.9 C)     Temp Source 05/28/20 1300 Oral     SpO2 05/28/20 1300 97 %     Weight 05/28/20 1303 172 lb (78 kg)     Height 05/28/20 1303 5\' 3"  (1.6 m)     Head Circumference --      Peak Flow --      Pain Score 05/28/20 1302 4     Pain Loc --      Pain Edu? --      Excl. in [place]? --     Constitutional: Alert and oriented. Well appearing and in no acute distress. Cardiovascular: Normal rate,  regular rhythm. Grossly normal heart sounds.  Good peripheral circulation. Respiratory: Normal respiratory effort.  No retractions. Lungs CTAB. Musculoskeletal: No lower extremity tenderness nor edema.  No joint effusions. Neurologic:  Normal speech and language. No gross focal neurologic deficits are appreciated. No gait instability. Skin: Edema and erythema right calf.   Psychiatric: Mood and affect are normal. Speech and behavior are normal.  ____________________________________________   LABS (all labs ordered are listed, but only abnormal results are displayed)  Labs Reviewed - No data to display ____________________________________________  EKG   ____________________________________________  RADIOLOGY  ED MD interpretation:    Official radiology report(s): No results found.  ____________________________________________   PROCEDURES  Procedure(s) performed (including Critical Care):  Procedures   ____________________________________________   INITIAL IMPRESSION / ASSESSMENT AND PLAN / ED COURSE  As part of my medical decision making, I reviewed the following data within the [facility]     Patient presents with insect bite to the right calf.  Area is edematous and erythematous consistent with mild  cellulitis.  Patient given discharge care instruction advised take medication as directed    Margaret Hendricks was evaluated in Emergency Department on 06/04/2020 for the symptoms described in the history of present illness. She was evaluated in the context of the global COVID-19 pandemic, which necessitated consideration that the patient might be at risk for infection with the SARS-CoV-2 virus that causes COVID-19. Institutional protocols and algorithms that pertain to the evaluation of patients at risk for COVID-19 are in a state of rapid change based on information released by regulatory bodies including the CDC and federal and state organizations. These policies and algorithms were followed during the patient's care in the ED.       ____________________________________________   FINAL CLINICAL IMPRESSION(S) / ED DIAGNOSES  Final diagnoses:  Bug bite with infection, initial encounter     ED Discharge Orders         Ordered    sulfamethoxazole-trimethoprim (BACTRIM DS) 800-160 MG tablet  2 times daily     Discontinue  Reprint     05/28/20 1342    naproxen (NAPROSYN) 500 MG tablet  2 times daily with meals     Discontinue  Reprint     05/28/20 1342    lidocaine (LIDODERM) 5 %  Every 12 hours     Discontinue  Reprint     05/28/20 1342           Note:  This document was prepared using Dragon voice recognition software and may include unintentional dictation errors.    Sable Feil, PA-C 06/04/20 1530    Delman Kitten, MD 06/06/20 [phone]

## 2020-06-20 ENCOUNTER — Ambulatory Visit (INDEPENDENT_AMBULATORY_CARE_PROVIDER_SITE_OTHER): Payer: Medicare HMO

## 2020-06-20 DIAGNOSIS — I5022 Chronic systolic (congestive) heart failure: Secondary | ICD-10-CM | POA: Diagnosis not present

## 2020-06-20 DIAGNOSIS — Z95810 Presence of automatic (implantable) cardiac defibrillator: Secondary | ICD-10-CM

## 2020-06-22 NOTE — Progress Notes (Signed)
EPIC Encounter for ICM Monitoring  Patient Name: Margaret Hendricks is a 64 y.o. female Date: 06/22/2020 Primary Care Physican: Perrin Maltese, MD Primary Cardiologist: Caryl Comes Electrophysiologist: Vergie Living Pacing: 98.2% LastWeight:171lbs  Time in AT/AF:  0.0 hr/day (0.0%)   Transmission reviewed.  Optivol thoracic impedancenormal.  Prescribed:Furosemide 20 mg 1 tablet daily  Labs: 05/21/2018 Creatinine 0.87, BUN 17, Potassium 4.0, Sodium 138, EGFR >60  Recommendations:None  Follow-up plan: ICM clinic phone appointment on 07/25/2020. 91 day device clinic remote transmission10/25/2021.   EP/Cardiology Office Visits: Recall 01/11/2021 with Dr. Caryl Comes.    Copy of ICM check sent to Dr. Caryl Comes.    3 month ICM trend: 06/20/2020    1 Year ICM trend:       [name], RN 06/22/2020 4:16 PM

## 2020-07-25 ENCOUNTER — Ambulatory Visit (INDEPENDENT_AMBULATORY_CARE_PROVIDER_SITE_OTHER): Payer: Medicare HMO

## 2020-07-25 DIAGNOSIS — Z95810 Presence of automatic (implantable) cardiac defibrillator: Secondary | ICD-10-CM

## 2020-07-25 DIAGNOSIS — I5022 Chronic systolic (congestive) heart failure: Secondary | ICD-10-CM

## 2020-07-26 NOTE — Progress Notes (Signed)
EPIC Encounter for ICM Monitoring  Patient Name: Margaret Hendricks is a 64 y.o. female Date: 07/26/2020 Primary Care Physican: Perrin Maltese, MD Primary Cardiologist: Caryl Comes Electrophysiologist: Vergie Living Pacing: 98.3% LastWeight:171lbs  Time in AT/AF:0.0 hr/day (0.0%)   Transmission reviewed.  Optivol thoracic impedancenormal.  Prescribed:Furosemide 20 mg 1 tablet daily  Labs: 05/21/2018 Creatinine 0.87, BUN 17, Potassium 4.0, Sodium 138, EGFR >60  Recommendations:None  Follow-up plan: ICM clinic phone appointment on 08/29/2020. 91 day device clinic remote transmission10/25/2021.   EP/Cardiology Office Visits:Recall 3/16/2022with Dr. Caryl Comes.   Copy of ICM check sent to [place].   3 month ICM trend: 07/25/2020    1 Year ICM trend:       [name], RN 07/26/2020 12:16 PM

## 2020-08-22 ENCOUNTER — Ambulatory Visit (INDEPENDENT_AMBULATORY_CARE_PROVIDER_SITE_OTHER): Payer: Medicare HMO

## 2020-08-22 DIAGNOSIS — I447 Left bundle-branch block, unspecified: Secondary | ICD-10-CM | POA: Diagnosis not present

## 2020-08-23 LAB — CUP PACEART REMOTE DEVICE CHECK
Battery Remaining Longevity: 47 mo
Battery Voltage: 2.97 V
Brady Statistic AP VP Percent: 3.27 %
Brady Statistic AP VS Percent: 0.1 %
Brady Statistic AS VP Percent: 95.16 %
Brady Statistic AS VS Percent: 1.47 %
Brady Statistic RA Percent Paced: 3.37 %
Brady Statistic RV Percent Paced: 1.35 %
Date Time Interrogation Session: 20211025033324
HighPow Impedance: 73 Ohm
Implantable Lead Implant Date: 20170918
Implantable Lead Implant Date: 20170918
Implantable Lead Implant Date: 20170918
Implantable Lead Location: 753858
Implantable Lead Location: 753859
Implantable Lead Location: 753860
Implantable Lead Model: 5076
Implantable Pulse Generator Implant Date: 20170918
Lead Channel Impedance Value: 1045 Ohm
Lead Channel Impedance Value: 1045 Ohm
Lead Channel Impedance Value: 249.509
Lead Channel Impedance Value: 253.333
Lead Channel Impedance Value: 267.31 Ohm
Lead Channel Impedance Value: 280.169
Lead Channel Impedance Value: 297.365
Lead Channel Impedance Value: 380 Ohm
Lead Channel Impedance Value: 399 Ohm
Lead Channel Impedance Value: 399 Ohm
Lead Channel Impedance Value: 456 Ohm
Lead Channel Impedance Value: 551 Ohm
Lead Channel Impedance Value: 570 Ohm
Lead Channel Impedance Value: 646 Ohm
Lead Channel Impedance Value: 893 Ohm
Lead Channel Impedance Value: 950 Ohm
Lead Channel Impedance Value: 988 Ohm
Lead Channel Impedance Value: 988 Ohm
Lead Channel Pacing Threshold Amplitude: 0.625 V
Lead Channel Pacing Threshold Amplitude: 0.625 V
Lead Channel Pacing Threshold Amplitude: 2.375 V
Lead Channel Pacing Threshold Pulse Width: 0.4 ms
Lead Channel Pacing Threshold Pulse Width: 0.4 ms
Lead Channel Pacing Threshold Pulse Width: 0.8 ms
Lead Channel Sensing Intrinsic Amplitude: 2.875 mV
Lead Channel Sensing Intrinsic Amplitude: 2.875 mV
Lead Channel Sensing Intrinsic Amplitude: 8.25 mV
Lead Channel Sensing Intrinsic Amplitude: 8.25 mV
Lead Channel Setting Pacing Amplitude: 2 V
Lead Channel Setting Pacing Amplitude: 2.5 V
Lead Channel Setting Pacing Amplitude: 3 V
Lead Channel Setting Pacing Pulse Width: 0.4 ms
Lead Channel Setting Pacing Pulse Width: 0.8 ms
Lead Channel Setting Sensing Sensitivity: 0.3 mV

## 2020-08-25 NOTE — Progress Notes (Signed)
Remote ICD transmission.   

## 2020-08-29 ENCOUNTER — Ambulatory Visit (INDEPENDENT_AMBULATORY_CARE_PROVIDER_SITE_OTHER): Payer: Medicare HMO

## 2020-08-29 DIAGNOSIS — I5022 Chronic systolic (congestive) heart failure: Secondary | ICD-10-CM | POA: Diagnosis not present

## 2020-08-29 DIAGNOSIS — Z95810 Presence of automatic (implantable) cardiac defibrillator: Secondary | ICD-10-CM | POA: Diagnosis not present

## 2020-09-02 NOTE — Progress Notes (Signed)
EPIC Encounter for ICM Monitoring  Patient Name: Sonja Manseau is a 64 y.o. female Date: 09/02/2020 Primary Care Physican: Perrin Maltese, MD Primary Cardiologist: Caryl Comes Electrophysiologist: Vergie Living Pacing: 98.3% LastWeight:171lbs  Time in AT/AF:0.0 hr/day (0.0%)   Transmission reviewed.  Optivol thoracic impedancenormal.  Prescribed:Furosemide 20 mg 1 tablet daily  Labs: 05/21/2018 Creatinine 0.87, BUN 17, Potassium 4.0, Sodium 138, EGFR >60  Recommendations:None  Follow-up plan: ICM clinic phone appointment on12/03/2020. 91 day device clinic remote transmission1/24/2022.   EP/Cardiology Office Visits:Recall 3/16/2022with Dr. Caryl Comes.   Copy of ICM check sent to [place].  3 month ICM trend: 08/29/2020    1 Year ICM trend:       [name], RN 09/02/2020 3:24 PM

## 2020-10-03 ENCOUNTER — Ambulatory Visit (INDEPENDENT_AMBULATORY_CARE_PROVIDER_SITE_OTHER): Payer: Medicare HMO

## 2020-10-03 DIAGNOSIS — Z95810 Presence of automatic (implantable) cardiac defibrillator: Secondary | ICD-10-CM

## 2020-10-03 DIAGNOSIS — I5022 Chronic systolic (congestive) heart failure: Secondary | ICD-10-CM

## 2020-10-04 NOTE — Progress Notes (Signed)
EPIC Encounter for ICM Monitoring  Patient Name: Margaret Hendricks is a 64 y.o. female Date: 10/04/2020 Primary Care Physican: Perrin Maltese, MD Primary Cardiologist: Caryl Comes Electrophysiologist: Vergie Living Pacing: 98.3% LastWeight:171lbs  Time in AT/AF:0.0 hr/day (0.0%)   Transmission reviewed.  Optivol thoracic impedancenormal.  Prescribed:Furosemide 20 mg 1 tablet daily  Labs: 05/21/2018 Creatinine 0.87, BUN 17, Potassium 4.0, Sodium 138, EGFR >60  Recommendations:None  Follow-up plan: ICM clinic phone appointment on1/07/2021. 91 day device clinic remote transmission1/24/2022.   EP/Cardiology Office Visits:Recall 3/16/2022with Dr. Caryl Comes.   Copy of ICM check sent to [place]..   3 month ICM trend: 10/03/2020    1 Year ICM trend:       [name], RN 10/04/2020 2:33 PM

## 2020-11-07 ENCOUNTER — Ambulatory Visit (INDEPENDENT_AMBULATORY_CARE_PROVIDER_SITE_OTHER): Payer: Medicare HMO

## 2020-11-07 DIAGNOSIS — Z95810 Presence of automatic (implantable) cardiac defibrillator: Secondary | ICD-10-CM | POA: Diagnosis not present

## 2020-11-07 DIAGNOSIS — I5022 Chronic systolic (congestive) heart failure: Secondary | ICD-10-CM

## 2020-11-09 NOTE — Progress Notes (Signed)
EPIC Encounter for ICM Monitoring  Patient Name: Kinga Cassar is a 65 y.o. female Date: 11/09/2020 Primary Care Physican: Perrin Maltese, MD Primary Cardiologist: Caryl Comes Electrophysiologist: Vergie Living Pacing: 98.3% LastWeight:171lbs  Time in AT/AF:0.0 hr/day (0.0%)   Transmission reviewed.  Optivol thoracic impedancenormal.  Prescribed:Furosemide 20 mg 1 tablet daily  Labs: 05/21/2018 Creatinine 0.87, BUN 17, Potassium 4.0, Sodium 138, EGFR >60  Recommendations:None  Follow-up plan: ICM clinic phone appointment on2/21/2022. 91 day device clinic remote transmission1/24/2022.   EP/Cardiology Office Visits:Recall 3/16/2022with Dr. Caryl Comes.   Copy of ICM check sent to [place].  3 month ICM trend: 11/07/2020.    1 Year ICM trend:       [name], RN 11/09/2020 11:11 AM

## 2020-11-21 ENCOUNTER — Ambulatory Visit (INDEPENDENT_AMBULATORY_CARE_PROVIDER_SITE_OTHER): Payer: Medicare HMO

## 2020-11-21 DIAGNOSIS — I428 Other cardiomyopathies: Secondary | ICD-10-CM | POA: Diagnosis not present

## 2020-11-21 LAB — CUP PACEART REMOTE DEVICE CHECK
Battery Remaining Longevity: 44 mo
Battery Voltage: 2.97 V
Brady Statistic AP VP Percent: 8.15 %
Brady Statistic AP VS Percent: 0.31 %
Brady Statistic AS VP Percent: 90.05 %
Brady Statistic AS VS Percent: 1.49 %
Brady Statistic RA Percent Paced: 8.45 %
Brady Statistic RV Percent Paced: 0.15 %
Date Time Interrogation Session: 20220124033622
HighPow Impedance: 71 Ohm
Implantable Lead Implant Date: 20170918
Implantable Lead Implant Date: 20170918
Implantable Lead Implant Date: 20170918
Implantable Lead Location: 753858
Implantable Lead Location: 753859
Implantable Lead Location: 753860
Implantable Lead Model: 5076
Implantable Pulse Generator Implant Date: 20170918
Lead Channel Impedance Value: 1007 Ohm
Lead Channel Impedance Value: 1045 Ohm
Lead Channel Impedance Value: 1045 Ohm
Lead Channel Impedance Value: 1064 Ohm
Lead Channel Impedance Value: 264.643
Lead Channel Impedance Value: 279.933
Lead Channel Impedance Value: 279.933
Lead Channel Impedance Value: 302.813
Lead Channel Impedance Value: 302.813
Lead Channel Impedance Value: 380 Ohm
Lead Channel Impedance Value: 399 Ohm
Lead Channel Impedance Value: 437 Ohm
Lead Channel Impedance Value: 494 Ohm
Lead Channel Impedance Value: 570 Ohm
Lead Channel Impedance Value: 646 Ohm
Lead Channel Impedance Value: 646 Ohm
Lead Channel Impedance Value: 988 Ohm
Lead Channel Impedance Value: 988 Ohm
Lead Channel Pacing Threshold Amplitude: 0.625 V
Lead Channel Pacing Threshold Amplitude: 0.625 V
Lead Channel Pacing Threshold Amplitude: 2.25 V
Lead Channel Pacing Threshold Pulse Width: 0.4 ms
Lead Channel Pacing Threshold Pulse Width: 0.4 ms
Lead Channel Pacing Threshold Pulse Width: 0.8 ms
Lead Channel Sensing Intrinsic Amplitude: 3 mV
Lead Channel Sensing Intrinsic Amplitude: 3 mV
Lead Channel Sensing Intrinsic Amplitude: 8.625 mV
Lead Channel Sensing Intrinsic Amplitude: 8.625 mV
Lead Channel Setting Pacing Amplitude: 2 V
Lead Channel Setting Pacing Amplitude: 2.5 V
Lead Channel Setting Pacing Amplitude: 2.75 V
Lead Channel Setting Pacing Pulse Width: 0.4 ms
Lead Channel Setting Pacing Pulse Width: 0.8 ms
Lead Channel Setting Sensing Sensitivity: 0.3 mV

## 2020-12-02 NOTE — Progress Notes (Signed)
Remote ICD transmission.   

## 2020-12-12 ENCOUNTER — Other Ambulatory Visit: Payer: Self-pay | Admitting: Internal Medicine

## 2020-12-12 DIAGNOSIS — Z1231 Encounter for screening mammogram for malignant neoplasm of breast: Secondary | ICD-10-CM

## 2020-12-19 ENCOUNTER — Ambulatory Visit (INDEPENDENT_AMBULATORY_CARE_PROVIDER_SITE_OTHER): Payer: Medicare HMO

## 2020-12-19 DIAGNOSIS — Z95810 Presence of automatic (implantable) cardiac defibrillator: Secondary | ICD-10-CM

## 2020-12-19 DIAGNOSIS — I5022 Chronic systolic (congestive) heart failure: Secondary | ICD-10-CM

## 2020-12-27 NOTE — Progress Notes (Signed)
EPIC Encounter for ICM Monitoring  Patient Name: Margaret Hendricks is a 65 y.o. female Date: 12/27/2020 Primary Care Physican: Perrin Maltese, MD Primary Cardiologist: Caryl Comes Electrophysiologist: Vergie Living Pacing: 98.3% LastWeight:171lbs  Time in AT/AF:0.0 hr/day (0.0%)   Transmission reviewed.  Optivol thoracic impedancenormal.  Prescribed:Furosemide 20 mg 1 tablet daily  Labs: 05/21/2018 Creatinine 0.87, BUN 17, Potassium 4.0, Sodium 138, EGFR >60  Recommendations:None  Follow-up plan: ICM clinic phone appointment on4/01/2021. 91 day device clinic remote transmission4/25/2022.   EP/Cardiology Office Visits:Recall 3/16/2022with Dr. Caryl Comes.   Copy of ICM check sent to [place].  3 month ICM trend: 12/19/2020.    1 Year ICM trend:       [name], RN 12/27/2020 4:40 PM

## 2021-01-05 ENCOUNTER — Ambulatory Visit
Admission: RE | Admit: 2021-01-05 | Discharge: 2021-01-05 | Disposition: A | Discharge status: Home / Self Care | Payer: Medicare HMO | Source: Ambulatory Visit | Attending: Internal Medicine | Admitting: Internal Medicine

## 2021-01-05 ENCOUNTER — Other Ambulatory Visit: Payer: Self-pay

## 2021-01-05 DIAGNOSIS — Z1231 Encounter for screening mammogram for malignant neoplasm of breast: Secondary | ICD-10-CM | POA: Insufficient documentation

## 2021-01-30 ENCOUNTER — Ambulatory Visit (INDEPENDENT_AMBULATORY_CARE_PROVIDER_SITE_OTHER): Payer: Medicare HMO

## 2021-01-30 DIAGNOSIS — Z95810 Presence of automatic (implantable) cardiac defibrillator: Secondary | ICD-10-CM

## 2021-01-30 DIAGNOSIS — I5022 Chronic systolic (congestive) heart failure: Secondary | ICD-10-CM

## 2021-01-31 NOTE — Progress Notes (Signed)
EPIC Encounter for ICM Monitoring  Patient Name: Margaret Hendricks is a 65 y.o. female Date: 01/31/2021 Primary Care Physican: Perrin Maltese, MD Primary Cardiologist: Caryl Comes Electrophysiologist: Vergie Living Pacing: 98.2% LastWeight:171lbs  Time in AT/AF:0.0 hr/day (0.0%)   Transmission reviewed.  Optivol thoracic impedancenormal.  Prescribed:Furosemide 20 mg 1 tablet daily  Labs: 05/21/2018 Creatinine 0.87, BUN 17, Potassium 4.0, Sodium 138, EGFR >60  Recommendations:None  Follow-up plan: ICM clinic phone appointment on5/24/2022. 91 day device clinic remote transmission4/25/2022.   EP/Cardiology Office Visits:Recall 3/16/2022with Dr. Caryl Comes.   Copy of ICM check sent to [name].   3 month ICM trend: 01/30/2021.    1 Year ICM trend:       Margaret Billings, RN 01/31/2021 12:05 PM

## 2021-02-03 ENCOUNTER — Other Ambulatory Visit: Payer: Self-pay | Admitting: Internal Medicine

## 2021-02-20 ENCOUNTER — Ambulatory Visit (INDEPENDENT_AMBULATORY_CARE_PROVIDER_SITE_OTHER): Payer: Medicare HMO

## 2021-02-20 DIAGNOSIS — I428 Other cardiomyopathies: Secondary | ICD-10-CM | POA: Diagnosis not present

## 2021-02-21 LAB — CUP PACEART REMOTE DEVICE CHECK
Battery Remaining Longevity: 42 mo
Battery Voltage: 2.96 V
Brady Statistic AP VP Percent: 1.36 %
Brady Statistic AP VS Percent: 0.07 %
Brady Statistic AS VP Percent: 97.05 %
Brady Statistic AS VS Percent: 1.53 %
Brady Statistic RA Percent Paced: 1.42 %
Brady Statistic RV Percent Paced: 4.61 %
Date Time Interrogation Session: 20220425022605
HighPow Impedance: 73 Ohm
Implantable Lead Implant Date: 20170918
Implantable Lead Implant Date: 20170918
Implantable Lead Implant Date: 20170918
Implantable Lead Location: 753858
Implantable Lead Location: 753859
Implantable Lead Location: 753860
Implantable Lead Model: 5076
Implantable Pulse Generator Implant Date: 20170918
Lead Channel Impedance Value: 1045 Ohm
Lead Channel Impedance Value: 1102 Ohm
Lead Channel Impedance Value: 1102 Ohm
Lead Channel Impedance Value: 1121 Ohm
Lead Channel Impedance Value: 1159 Ohm
Lead Channel Impedance Value: 1159 Ohm
Lead Channel Impedance Value: 294.194
Lead Channel Impedance Value: 306.923
Lead Channel Impedance Value: 314.776
Lead Channel Impedance Value: 317.612
Lead Channel Impedance Value: 326.029
Lead Channel Impedance Value: 399 Ohm
Lead Channel Impedance Value: 456 Ohm
Lead Channel Impedance Value: 494 Ohm
Lead Channel Impedance Value: 570 Ohm
Lead Channel Impedance Value: 608 Ohm
Lead Channel Impedance Value: 665 Ohm
Lead Channel Impedance Value: 703 Ohm
Lead Channel Pacing Threshold Amplitude: 0.5 V
Lead Channel Pacing Threshold Amplitude: 0.5 V
Lead Channel Pacing Threshold Amplitude: 1.875 V
Lead Channel Pacing Threshold Pulse Width: 0.4 ms
Lead Channel Pacing Threshold Pulse Width: 0.4 ms
Lead Channel Pacing Threshold Pulse Width: 0.8 ms
Lead Channel Sensing Intrinsic Amplitude: 2.625 mV
Lead Channel Sensing Intrinsic Amplitude: 2.625 mV
Lead Channel Sensing Intrinsic Amplitude: 9.5 mV
Lead Channel Sensing Intrinsic Amplitude: 9.5 mV
Lead Channel Setting Pacing Amplitude: 2 V
Lead Channel Setting Pacing Amplitude: 2.5 V
Lead Channel Setting Pacing Amplitude: 2.5 V
Lead Channel Setting Pacing Pulse Width: 0.4 ms
Lead Channel Setting Pacing Pulse Width: 0.8 ms
Lead Channel Setting Sensing Sensitivity: 0.3 mV

## 2021-03-09 NOTE — Progress Notes (Signed)
Remote ICD transmission.   

## 2021-03-21 ENCOUNTER — Ambulatory Visit (INDEPENDENT_AMBULATORY_CARE_PROVIDER_SITE_OTHER): Payer: Medicare HMO

## 2021-03-21 DIAGNOSIS — I5022 Chronic systolic (congestive) heart failure: Secondary | ICD-10-CM | POA: Diagnosis not present

## 2021-03-21 DIAGNOSIS — Z95810 Presence of automatic (implantable) cardiac defibrillator: Secondary | ICD-10-CM | POA: Diagnosis not present

## 2021-03-22 NOTE — Progress Notes (Signed)
EPIC Encounter for ICM Monitoring  Patient Name: Margaret Hendricks is a 65 y.o. female Date: 03/22/2021 Primary Care Physican: Perrin Maltese, MD Primary Cardiologist: Caryl Comes Electrophysiologist: Vergie Living Pacing: 98.1% LastWeight:171lbs  Time in AT/AF 0.0 hr/day (0.0%)   Transmission reviewed.  Optivol thoracic impedancenormal.  Prescribed:Furosemide 20 mg 1 tablet daily  Labs: 05/21/2018 Creatinine 0.87, BUN 17, Potassium 4.0, Sodium 138, EGFR >60  Recommendations:No changes.  Follow-up plan: ICM clinic phone appointment on6/27/2022. 91 day device clinic remote transmission7/25/2022.   EP/Cardiology Office Visits:Recall 3/16/2022with Dr. Caryl Comes.   Copy of ICM check sent to [place].   3 month ICM trend: 03/21/2021.    1 Year ICM trend:       [name], RN 03/22/2021 8:35 AM

## 2021-04-17 DIAGNOSIS — J449 Chronic obstructive pulmonary disease, unspecified: Secondary | ICD-10-CM | POA: Diagnosis not present

## 2021-04-17 DIAGNOSIS — E119 Type 2 diabetes mellitus without complications: Secondary | ICD-10-CM | POA: Diagnosis not present

## 2021-04-17 DIAGNOSIS — Z0100 Encounter for examination of eyes and vision without abnormal findings: Secondary | ICD-10-CM | POA: Diagnosis not present

## 2021-04-24 ENCOUNTER — Ambulatory Visit (INDEPENDENT_AMBULATORY_CARE_PROVIDER_SITE_OTHER): Payer: Medicare HMO

## 2021-04-24 DIAGNOSIS — Z95810 Presence of automatic (implantable) cardiac defibrillator: Secondary | ICD-10-CM | POA: Diagnosis not present

## 2021-04-24 DIAGNOSIS — I5022 Chronic systolic (congestive) heart failure: Secondary | ICD-10-CM

## 2021-04-26 NOTE — Progress Notes (Signed)
EPIC Encounter for ICM Monitoring  Patient Name: Margaret Hendricks is a 65 y.o. female Date: 04/26/2021 Primary Care Physican: Perrin Maltese, MD Primary Cardiologist: Caryl Comes Electrophysiologist: Vergie Living Pacing:  98.2%                 Last Weight: 171 lbs   Time in AT/AF  0.0 hr/day (0.0%)          Transmission reviewed.   Optivol thoracic impedance normal.   Prescribed: Furosemide 20 mg 1 tablet daily   Labs: 05/21/2018 Creatinine 0.87, BUN 17, Potassium 4.0, Sodium 138, EGFR >60   Recommendations: No changes.   Follow-up plan: ICM clinic phone appointment on 05/29/2021.   91 day device clinic remote transmission 05/22/2021.     EP/Cardiology Office Visits: Recall 01/11/2021 with Dr. Caryl Comes.     Copy of ICM check sent to Dr. Caryl Comes. .   3 month ICM trend: 04/24/2021.    1 Year ICM trend:       [name], RN 04/26/2021 3:59 PM

## 2021-05-10 ENCOUNTER — Other Ambulatory Visit: Payer: Self-pay | Admitting: Internal Medicine

## 2021-05-22 ENCOUNTER — Ambulatory Visit: Payer: Medicare HMO

## 2021-05-31 NOTE — Progress Notes (Signed)
No ICM remote transmission received for 05/29/2021 and next ICM transmission scheduled for 06/26/2021.

## 2021-07-04 NOTE — Progress Notes (Signed)
No ICM remote transmission received for 06/26/2021 and next ICM transmission scheduled for 07/12/2021.

## 2021-07-10 DIAGNOSIS — E669 Obesity, unspecified: Secondary | ICD-10-CM | POA: Diagnosis not present

## 2021-07-10 DIAGNOSIS — E782 Mixed hyperlipidemia: Secondary | ICD-10-CM | POA: Diagnosis not present

## 2021-07-10 DIAGNOSIS — I509 Heart failure, unspecified: Secondary | ICD-10-CM | POA: Diagnosis not present

## 2021-07-10 DIAGNOSIS — D508 Other iron deficiency anemias: Secondary | ICD-10-CM | POA: Diagnosis not present

## 2021-07-10 DIAGNOSIS — Z23 Encounter for immunization: Secondary | ICD-10-CM | POA: Diagnosis not present

## 2021-07-10 DIAGNOSIS — G4733 Obstructive sleep apnea (adult) (pediatric): Secondary | ICD-10-CM | POA: Diagnosis not present

## 2021-07-10 DIAGNOSIS — I420 Dilated cardiomyopathy: Secondary | ICD-10-CM | POA: Diagnosis not present

## 2021-07-10 DIAGNOSIS — E119 Type 2 diabetes mellitus without complications: Secondary | ICD-10-CM | POA: Diagnosis not present

## 2021-07-10 DIAGNOSIS — I340 Nonrheumatic mitral (valve) insufficiency: Secondary | ICD-10-CM | POA: Diagnosis not present

## 2021-07-10 DIAGNOSIS — I10 Essential (primary) hypertension: Secondary | ICD-10-CM | POA: Diagnosis not present

## 2021-07-11 DIAGNOSIS — D508 Other iron deficiency anemias: Secondary | ICD-10-CM | POA: Diagnosis not present

## 2021-07-11 DIAGNOSIS — E782 Mixed hyperlipidemia: Secondary | ICD-10-CM | POA: Diagnosis not present

## 2021-07-11 DIAGNOSIS — I509 Heart failure, unspecified: Secondary | ICD-10-CM | POA: Diagnosis not present

## 2021-07-11 DIAGNOSIS — R799 Abnormal finding of blood chemistry, unspecified: Secondary | ICD-10-CM | POA: Diagnosis not present

## 2021-07-11 DIAGNOSIS — E119 Type 2 diabetes mellitus without complications: Secondary | ICD-10-CM | POA: Diagnosis not present

## 2021-07-12 ENCOUNTER — Ambulatory Visit (INDEPENDENT_AMBULATORY_CARE_PROVIDER_SITE_OTHER): Payer: Medicare HMO

## 2021-07-12 DIAGNOSIS — I5022 Chronic systolic (congestive) heart failure: Secondary | ICD-10-CM

## 2021-07-12 DIAGNOSIS — Z95810 Presence of automatic (implantable) cardiac defibrillator: Secondary | ICD-10-CM

## 2021-07-14 NOTE — Progress Notes (Signed)
EPIC Encounter for ICM Monitoring  Patient Name: Margaret Hendricks is a 65 y.o. female Date: 07/14/2021 Primary Care Physican: Perrin Maltese, MD Primary Cardiologist: Caryl Comes Electrophysiologist: Vergie Living Pacing:  98.1%                 Last Weight: 171 lbs   Time in AT/AF  0.0 hr/day (0.0%)          Transmission reviewed.   Optivol thoracic impedance normal.   Prescribed: Furosemide 20 mg 1 tablet daily   Labs: 05/21/2018 Creatinine 0.87, BUN 17, Potassium 4.0, Sodium 138, EGFR >60   Recommendations: No changes.   Follow-up plan: ICM clinic phone appointment on 08/14/2021.   91 day device clinic remote transmission 05/22/2021.     EP/Cardiology Office Visits: Recall 01/11/2021 with Dr. Caryl Comes.     Copy of ICM check sent to Dr. Caryl Comes.    3 month ICM trend: 07/12/2021.    1 Year ICM trend:       Margaret Billings, RN 07/14/2021 10:59 AM

## 2021-07-26 DIAGNOSIS — I509 Heart failure, unspecified: Secondary | ICD-10-CM | POA: Diagnosis not present

## 2021-08-14 ENCOUNTER — Ambulatory Visit (INDEPENDENT_AMBULATORY_CARE_PROVIDER_SITE_OTHER): Payer: Medicare HMO

## 2021-08-14 DIAGNOSIS — Z95810 Presence of automatic (implantable) cardiac defibrillator: Secondary | ICD-10-CM

## 2021-08-14 DIAGNOSIS — I5022 Chronic systolic (congestive) heart failure: Secondary | ICD-10-CM

## 2021-08-15 NOTE — Progress Notes (Signed)
EPIC Encounter for ICM Monitoring  Patient Name: Margaret Hendricks is a 65 y.o. female Date: 08/15/2021 Primary Care Physican: Perrin Maltese, MD Primary Cardiologist: Caryl Comes Electrophysiologist: Vergie Living Pacing:  98.1%                 Last Weight: 171 lbs   Time in AT/AF  0.0 hr/day (0.0%)          Transmission reviewed.   Optivol thoracic impedance normal.   Prescribed: Furosemide 20 mg 1 tablet daily   Labs: 05/21/2018 Creatinine 0.87, BUN 17, Potassium 4.0, Sodium 138, EGFR >60   Recommendations: No changes.   Follow-up plan: ICM clinic phone appointment on 09/25/2021.   91 day device clinic remote transmission 08/21/2021.     EP/Cardiology Office Visits: Recall 01/11/2021 with Dr. Caryl Comes (last visit 01/12/2020).     Copy of ICM check sent to Dr. Caryl Comes.    3 month ICM trend: 08/14/2021.    1 Year ICM trend:       [name], RN 08/15/2021 5:04 PM

## 2021-08-21 ENCOUNTER — Ambulatory Visit (INDEPENDENT_AMBULATORY_CARE_PROVIDER_SITE_OTHER): Payer: Medicare HMO

## 2021-08-21 DIAGNOSIS — I428 Other cardiomyopathies: Secondary | ICD-10-CM

## 2021-08-22 LAB — CUP PACEART REMOTE DEVICE CHECK
Battery Remaining Longevity: 32 mo
Battery Voltage: 2.95 V
Brady Statistic AP VP Percent: 2.49 %
Brady Statistic AP VS Percent: 0.1 %
Brady Statistic AS VP Percent: 95.94 %
Brady Statistic AS VS Percent: 1.47 %
Brady Statistic RA Percent Paced: 2.59 %
Brady Statistic RV Percent Paced: 1.88 %
Date Time Interrogation Session: 20221024012203
HighPow Impedance: 75 Ohm
Implantable Lead Implant Date: 20170918
Implantable Lead Implant Date: 20170918
Implantable Lead Implant Date: 20170918
Implantable Lead Location: 753858
Implantable Lead Location: 753859
Implantable Lead Location: 753860
Implantable Lead Model: 5076
Implantable Pulse Generator Implant Date: 20170918
Lead Channel Impedance Value: 1007 Ohm
Lead Channel Impedance Value: 1007 Ohm
Lead Channel Impedance Value: 1045 Ohm
Lead Channel Impedance Value: 1064 Ohm
Lead Channel Impedance Value: 1102 Ohm
Lead Channel Impedance Value: 1121 Ohm
Lead Channel Impedance Value: 270 Ohm
Lead Channel Impedance Value: 278.237
Lead Channel Impedance Value: 294.194
Lead Channel Impedance Value: 296.578
Lead Channel Impedance Value: 314.776
Lead Channel Impedance Value: 437 Ohm
Lead Channel Impedance Value: 456 Ohm
Lead Channel Impedance Value: 456 Ohm
Lead Channel Impedance Value: 513 Ohm
Lead Channel Impedance Value: 570 Ohm
Lead Channel Impedance Value: 608 Ohm
Lead Channel Impedance Value: 703 Ohm
Lead Channel Pacing Threshold Amplitude: 0.5 V
Lead Channel Pacing Threshold Amplitude: 0.625 V
Lead Channel Pacing Threshold Amplitude: 1.875 V
Lead Channel Pacing Threshold Pulse Width: 0.4 ms
Lead Channel Pacing Threshold Pulse Width: 0.4 ms
Lead Channel Pacing Threshold Pulse Width: 0.8 ms
Lead Channel Sensing Intrinsic Amplitude: 2.75 mV
Lead Channel Sensing Intrinsic Amplitude: 2.75 mV
Lead Channel Sensing Intrinsic Amplitude: 8.375 mV
Lead Channel Sensing Intrinsic Amplitude: 8.375 mV
Lead Channel Setting Pacing Amplitude: 2 V
Lead Channel Setting Pacing Amplitude: 2.5 V
Lead Channel Setting Pacing Amplitude: 2.75 V
Lead Channel Setting Pacing Pulse Width: 0.4 ms
Lead Channel Setting Pacing Pulse Width: 0.8 ms
Lead Channel Setting Sensing Sensitivity: 0.3 mV

## 2021-08-29 NOTE — Progress Notes (Signed)
Remote ICD transmission.   

## 2021-09-25 ENCOUNTER — Ambulatory Visit (INDEPENDENT_AMBULATORY_CARE_PROVIDER_SITE_OTHER): Payer: Medicare HMO

## 2021-09-25 DIAGNOSIS — Z95810 Presence of automatic (implantable) cardiac defibrillator: Secondary | ICD-10-CM | POA: Diagnosis not present

## 2021-09-25 DIAGNOSIS — I5022 Chronic systolic (congestive) heart failure: Secondary | ICD-10-CM | POA: Diagnosis not present

## 2021-09-26 NOTE — Progress Notes (Signed)
EPIC Encounter for ICM Monitoring  Patient Name: Margaret Hendricks is a 65 y.o. female Date: 09/26/2021 Primary Care Physican: Perrin Maltese, MD Primary Cardiologist: Caryl Comes Electrophysiologist: Vergie Living Pacing:  98.1%                 Last Weight: 171 lbs   Time in AT/AF  0.0 hr/day (0.0%)          Transmission reviewed.   Optivol thoracic impedance normal.   Prescribed: Furosemide 20 mg 1 tablet daily   Labs: 05/21/2018 Creatinine 0.87, BUN 17, Potassium 4.0, Sodium 138, EGFR >60   Recommendations: No changes.   Follow-up plan: ICM clinic phone appointment on 11/06/2021.   91 day device clinic remote transmission 11/20/2021.     EP/Cardiology Office Visits: Recall 01/11/2021 with Dr. Caryl Comes (last visit 01/12/2020).     Copy of ICM check sent to Dr. Caryl Comes.    3 month ICM trend: 09/25/2021.    12-14 Month ICM trend:       [name], RN 09/26/2021 5:01 PM

## 2021-10-09 DIAGNOSIS — G4733 Obstructive sleep apnea (adult) (pediatric): Secondary | ICD-10-CM | POA: Diagnosis not present

## 2021-10-09 DIAGNOSIS — E782 Mixed hyperlipidemia: Secondary | ICD-10-CM | POA: Diagnosis not present

## 2021-10-09 DIAGNOSIS — I10 Essential (primary) hypertension: Secondary | ICD-10-CM | POA: Diagnosis not present

## 2021-10-09 DIAGNOSIS — I509 Heart failure, unspecified: Secondary | ICD-10-CM | POA: Diagnosis not present

## 2021-10-09 DIAGNOSIS — E119 Type 2 diabetes mellitus without complications: Secondary | ICD-10-CM | POA: Diagnosis not present

## 2021-10-10 DIAGNOSIS — I509 Heart failure, unspecified: Secondary | ICD-10-CM | POA: Diagnosis not present

## 2021-10-10 DIAGNOSIS — I10 Essential (primary) hypertension: Secondary | ICD-10-CM | POA: Diagnosis not present

## 2021-10-10 DIAGNOSIS — E119 Type 2 diabetes mellitus without complications: Secondary | ICD-10-CM | POA: Diagnosis not present

## 2021-10-10 DIAGNOSIS — E782 Mixed hyperlipidemia: Secondary | ICD-10-CM | POA: Diagnosis not present

## 2021-11-06 ENCOUNTER — Ambulatory Visit (INDEPENDENT_AMBULATORY_CARE_PROVIDER_SITE_OTHER): Payer: Medicare HMO

## 2021-11-06 DIAGNOSIS — I5022 Chronic systolic (congestive) heart failure: Secondary | ICD-10-CM | POA: Diagnosis not present

## 2021-11-06 DIAGNOSIS — Z95810 Presence of automatic (implantable) cardiac defibrillator: Secondary | ICD-10-CM

## 2021-11-08 NOTE — Progress Notes (Signed)
EPIC Encounter for ICM Monitoring  Patient Name: Margaret Hendricks is a 66 y.o. female Date: 11/08/2021 Primary Care Physican: Margaret Maltese, MD Primary Cardiologist: Margaret Hendricks Electrophysiologist: Margaret Hendricks Pacing:  98.3%                 Last Weight: 171 lbs   Time in AT/AF  0.0 hr/day (0.0%)          Transmission reviewed.   Optivol thoracic impedance normal.   Prescribed: Furosemide 20 mg 1 tablet daily   Labs: 05/21/2018 Creatinine 0.87, BUN 17, Potassium 4.0, Sodium 138, EGFR >60   Recommendations: No changes.   Follow-up plan: ICM clinic phone appointment on 12/11/2021.   91 day device clinic remote transmission 11/20/2021.     EP/Cardiology Office Visits: Recall 01/11/2021 with Dr. Caryl Hendricks (last visit 01/12/2020).     Copy of ICM check sent to Dr. Caryl Hendricks.    3 month ICM trend: 11/06/2021.    12-14 Month ICM trend:     Margaret Billings, RN 11/08/2021 5:11 PM

## 2021-11-20 ENCOUNTER — Ambulatory Visit (INDEPENDENT_AMBULATORY_CARE_PROVIDER_SITE_OTHER): Payer: Medicare HMO

## 2021-11-20 DIAGNOSIS — I447 Left bundle-branch block, unspecified: Secondary | ICD-10-CM | POA: Diagnosis not present

## 2021-11-20 LAB — CUP PACEART REMOTE DEVICE CHECK
Battery Remaining Longevity: 30 mo
Battery Voltage: 2.95 V
Brady Statistic AP VP Percent: 2.37 %
Brady Statistic AP VS Percent: 0.08 %
Brady Statistic AS VP Percent: 96.07 %
Brady Statistic AS VS Percent: 1.48 %
Brady Statistic RA Percent Paced: 2.45 %
Brady Statistic RV Percent Paced: 1.85 %
Date Time Interrogation Session: 20230123063624
HighPow Impedance: 75 Ohm
Implantable Lead Implant Date: 20170918
Implantable Lead Implant Date: 20170918
Implantable Lead Implant Date: 20170918
Implantable Lead Location: 753858
Implantable Lead Location: 753859
Implantable Lead Location: 753860
Implantable Lead Model: 5076
Implantable Pulse Generator Implant Date: 20170918
Lead Channel Impedance Value: 1007 Ohm
Lead Channel Impedance Value: 1007 Ohm
Lead Channel Impedance Value: 1045 Ohm
Lead Channel Impedance Value: 1064 Ohm
Lead Channel Impedance Value: 264.643
Lead Channel Impedance Value: 264.643
Lead Channel Impedance Value: 283.443
Lead Channel Impedance Value: 285 Ohm
Lead Channel Impedance Value: 306.923
Lead Channel Impedance Value: 380 Ohm
Lead Channel Impedance Value: 399 Ohm
Lead Channel Impedance Value: 399 Ohm
Lead Channel Impedance Value: 494 Ohm
Lead Channel Impedance Value: 570 Ohm
Lead Channel Impedance Value: 570 Ohm
Lead Channel Impedance Value: 665 Ohm
Lead Channel Impedance Value: 950 Ohm
Lead Channel Impedance Value: 988 Ohm
Lead Channel Pacing Threshold Amplitude: 0.5 V
Lead Channel Pacing Threshold Amplitude: 0.625 V
Lead Channel Pacing Threshold Amplitude: 2.375 V
Lead Channel Pacing Threshold Pulse Width: 0.4 ms
Lead Channel Pacing Threshold Pulse Width: 0.4 ms
Lead Channel Pacing Threshold Pulse Width: 0.8 ms
Lead Channel Sensing Intrinsic Amplitude: 2.75 mV
Lead Channel Sensing Intrinsic Amplitude: 2.75 mV
Lead Channel Sensing Intrinsic Amplitude: 8.375 mV
Lead Channel Sensing Intrinsic Amplitude: 8.375 mV
Lead Channel Setting Pacing Amplitude: 2 V
Lead Channel Setting Pacing Amplitude: 2.5 V
Lead Channel Setting Pacing Amplitude: 3 V
Lead Channel Setting Pacing Pulse Width: 0.4 ms
Lead Channel Setting Pacing Pulse Width: 0.8 ms
Lead Channel Setting Sensing Sensitivity: 0.3 mV

## 2021-12-01 NOTE — Progress Notes (Signed)
Remote ICD transmission.   

## 2021-12-11 ENCOUNTER — Ambulatory Visit (INDEPENDENT_AMBULATORY_CARE_PROVIDER_SITE_OTHER): Payer: Medicare HMO

## 2021-12-11 DIAGNOSIS — Z95810 Presence of automatic (implantable) cardiac defibrillator: Secondary | ICD-10-CM

## 2021-12-11 DIAGNOSIS — I5022 Chronic systolic (congestive) heart failure: Secondary | ICD-10-CM

## 2021-12-13 NOTE — Progress Notes (Signed)
EPIC Encounter for ICM Monitoring  Patient Name: Margaret Hendricks is a 66 y.o. female Date: 12/13/2021 Primary Care Physican: Perrin Maltese, MD Primary Cardiologist: Caryl Comes Electrophysiologist: Vergie Living Pacing:  98.3%                 Last Weight: 171 lbs   Time in AT/AF  <0.1 hr/day (<0.1%)         Transmission reviewed.   Optivol thoracic impedance normal.   Prescribed: Furosemide 20 mg 1 tablet daily   Labs: 05/21/2018 Creatinine 0.87, BUN 17, Potassium 4.0, Sodium 138, EGFR >60   Recommendations: No changes.   Follow-up plan: ICM clinic phone appointment on 01/15/2022.   91 day device clinic remote transmission 02/19/2022.     EP/Cardiology Office Visits: Recall 01/11/2021 with Dr. Caryl Comes (last visit 01/12/2020).     Copy of ICM check sent to Dr. Caryl Comes.    3 month ICM trend: 12/11/2021.    12-14 Month ICM trend:     [name], RN 12/13/2021 5:01 PM

## 2022-01-08 ENCOUNTER — Other Ambulatory Visit: Payer: Self-pay | Admitting: Internal Medicine

## 2022-01-08 DIAGNOSIS — Z1231 Encounter for screening mammogram for malignant neoplasm of breast: Secondary | ICD-10-CM

## 2022-01-08 DIAGNOSIS — E119 Type 2 diabetes mellitus without complications: Secondary | ICD-10-CM | POA: Diagnosis not present

## 2022-01-08 DIAGNOSIS — D508 Other iron deficiency anemias: Secondary | ICD-10-CM | POA: Diagnosis not present

## 2022-01-08 DIAGNOSIS — G4733 Obstructive sleep apnea (adult) (pediatric): Secondary | ICD-10-CM | POA: Diagnosis not present

## 2022-01-08 DIAGNOSIS — I420 Dilated cardiomyopathy: Secondary | ICD-10-CM | POA: Diagnosis not present

## 2022-01-08 DIAGNOSIS — I509 Heart failure, unspecified: Secondary | ICD-10-CM | POA: Diagnosis not present

## 2022-01-08 DIAGNOSIS — I442 Atrioventricular block, complete: Secondary | ICD-10-CM | POA: Diagnosis not present

## 2022-01-08 DIAGNOSIS — E669 Obesity, unspecified: Secondary | ICD-10-CM | POA: Diagnosis not present

## 2022-01-08 DIAGNOSIS — I10 Essential (primary) hypertension: Secondary | ICD-10-CM | POA: Diagnosis not present

## 2022-01-08 DIAGNOSIS — R609 Edema, unspecified: Secondary | ICD-10-CM | POA: Diagnosis not present

## 2022-01-08 DIAGNOSIS — I340 Nonrheumatic mitral (valve) insufficiency: Secondary | ICD-10-CM | POA: Diagnosis not present

## 2022-01-08 DIAGNOSIS — E782 Mixed hyperlipidemia: Secondary | ICD-10-CM | POA: Diagnosis not present

## 2022-01-15 ENCOUNTER — Ambulatory Visit (INDEPENDENT_AMBULATORY_CARE_PROVIDER_SITE_OTHER): Payer: Medicare HMO

## 2022-01-15 DIAGNOSIS — I5022 Chronic systolic (congestive) heart failure: Secondary | ICD-10-CM

## 2022-01-15 DIAGNOSIS — Z95810 Presence of automatic (implantable) cardiac defibrillator: Secondary | ICD-10-CM | POA: Diagnosis not present

## 2022-01-19 NOTE — Progress Notes (Signed)
EPIC Encounter for ICM Monitoring ? ?Patient Name: Margaret Hendricks is a 66 y.o. female ?Date: 01/19/2022 ?Primary Care Physican: Perrin Maltese, MD ?Primary Cardiologist: Caryl Comes ?Electrophysiologist: Caryl Comes ?Bi-V Pacing:  98.3%                 ?Last Weight: 171 lbs ?  ?Time in AT/AF  <0.1 hr/day (<0.1%) ?  ?      Transmission reviewed. ?  ?Optivol thoracic impedance normal. ?  ?Prescribed: Furosemide 20 mg 1 tablet daily ?  ?Labs: ?05/21/2018 Creatinine 0.87, BUN 17, Potassium 4.0, Sodium 138, EGFR >60 ?  ?Recommendations: No changes. ?  ?Follow-up plan: ICM clinic phone appointment on 02/20/2022.   91 day device clinic remote transmission 02/19/2022.   ?  ?EP/Cardiology Office Visits: Recall 01/11/2021 with Dr. Caryl Comes (last visit 01/12/2020).   ?  ?Copy of ICM check sent to Dr. Caryl Comes. ? ?3 month ICM trend: 01/15/2022. ? ? ? ?12-14 Month ICM trend:  ? ? ? ?[name], RN ?01/19/2022 ?1:27 PM ? ?

## 2022-02-14 ENCOUNTER — Ambulatory Visit
Admission: RE | Admit: 2022-02-14 | Discharge: 2022-02-14 | Disposition: A | Discharge status: Home / Self Care | Payer: Medicare HMO | Source: Ambulatory Visit | Attending: Internal Medicine | Admitting: Internal Medicine

## 2022-02-14 DIAGNOSIS — Z1231 Encounter for screening mammogram for malignant neoplasm of breast: Secondary | ICD-10-CM | POA: Insufficient documentation

## 2022-02-19 ENCOUNTER — Ambulatory Visit (INDEPENDENT_AMBULATORY_CARE_PROVIDER_SITE_OTHER): Payer: Medicare HMO

## 2022-02-19 DIAGNOSIS — I428 Other cardiomyopathies: Secondary | ICD-10-CM

## 2022-02-20 ENCOUNTER — Ambulatory Visit (INDEPENDENT_AMBULATORY_CARE_PROVIDER_SITE_OTHER): Payer: Medicare HMO

## 2022-02-20 DIAGNOSIS — Z95810 Presence of automatic (implantable) cardiac defibrillator: Secondary | ICD-10-CM

## 2022-02-20 DIAGNOSIS — I5022 Chronic systolic (congestive) heart failure: Secondary | ICD-10-CM | POA: Diagnosis not present

## 2022-02-20 LAB — CUP PACEART REMOTE DEVICE CHECK
Battery Remaining Longevity: 27 mo
Battery Voltage: 2.95 V
Brady Statistic AP VP Percent: 5.27 %
Brady Statistic AP VS Percent: 0.17 %
Brady Statistic AS VP Percent: 93.04 %
Brady Statistic AS VS Percent: 1.52 %
Brady Statistic RA Percent Paced: 5.44 %
Brady Statistic RV Percent Paced: 1.9 %
Date Time Interrogation Session: 20230424031705
HighPow Impedance: 67 Ohm
Implantable Lead Implant Date: 20170918
Implantable Lead Implant Date: 20170918
Implantable Lead Implant Date: 20170918
Implantable Lead Location: 753858
Implantable Lead Location: 753859
Implantable Lead Location: 753860
Implantable Lead Model: 5076
Implantable Pulse Generator Implant Date: 20170918
Lead Channel Impedance Value: 1007 Ohm
Lead Channel Impedance Value: 1045 Ohm
Lead Channel Impedance Value: 1064 Ohm
Lead Channel Impedance Value: 264.643
Lead Channel Impedance Value: 264.643
Lead Channel Impedance Value: 283.443
Lead Channel Impedance Value: 285 Ohm
Lead Channel Impedance Value: 306.923
Lead Channel Impedance Value: 380 Ohm
Lead Channel Impedance Value: 380 Ohm
Lead Channel Impedance Value: 437 Ohm
Lead Channel Impedance Value: 494 Ohm
Lead Channel Impedance Value: 570 Ohm
Lead Channel Impedance Value: 570 Ohm
Lead Channel Impedance Value: 665 Ohm
Lead Channel Impedance Value: 950 Ohm
Lead Channel Impedance Value: 988 Ohm
Lead Channel Impedance Value: 988 Ohm
Lead Channel Pacing Threshold Amplitude: 0.5 V
Lead Channel Pacing Threshold Amplitude: 0.625 V
Lead Channel Pacing Threshold Amplitude: 2.25 V
Lead Channel Pacing Threshold Pulse Width: 0.4 ms
Lead Channel Pacing Threshold Pulse Width: 0.4 ms
Lead Channel Pacing Threshold Pulse Width: 0.8 ms
Lead Channel Sensing Intrinsic Amplitude: 2.875 mV
Lead Channel Sensing Intrinsic Amplitude: 2.875 mV
Lead Channel Sensing Intrinsic Amplitude: 8.375 mV
Lead Channel Sensing Intrinsic Amplitude: 8.375 mV
Lead Channel Setting Pacing Amplitude: 2 V
Lead Channel Setting Pacing Amplitude: 2.5 V
Lead Channel Setting Pacing Amplitude: 3 V
Lead Channel Setting Pacing Pulse Width: 0.4 ms
Lead Channel Setting Pacing Pulse Width: 0.8 ms
Lead Channel Setting Sensing Sensitivity: 0.3 mV

## 2022-02-20 NOTE — Progress Notes (Signed)
EPIC Encounter for ICM Monitoring ? ?Patient Name: Margaret Hendricks is a 66 y.o. female ?Date: 02/20/2022 ?Primary Care Physican: Perrin Maltese, MD ?Primary Cardiologist: Caryl Comes ?Electrophysiologist: Caryl Comes ?Bi-V Pacing:  98.2%                 ?Last Weight: 171 lbs ?  ?Time in AT/AF   0.0 hr/day (0.0%) ?  ?      Transmission reviewed. ?  ?Optivol thoracic impedance normal. ?  ?Prescribed: Furosemide 20 mg 1 tablet daily ?  ?Labs: ?05/21/2018 Creatinine 0.87, BUN 17, Potassium 4.0, Sodium 138, EGFR >60 ?  ?Recommendations: No changes. ?  ?Follow-up plan: ICM clinic phone appointment on 04/02/2022.   91 day device clinic remote transmission 05/21/2022.   ?  ?EP/Cardiology Office Visits: Recall 01/11/2021 with Dr. Caryl Comes (last visit 01/12/2020).  Message sent to EP scheduler 4/25 to call patient to schedule overdue EP appointment.   ?  ?Copy of ICM check sent to Dr. Caryl Comes. ? ?3 month ICM trend: 02/19/2022. ? ? ? ?12-14 Month ICM trend:  ? ? ? ?[name], RN ?02/20/2022 ?8:18 AM ? ?

## 2022-02-27 ENCOUNTER — Ambulatory Visit (INDEPENDENT_AMBULATORY_CARE_PROVIDER_SITE_OTHER): Payer: Medicare HMO | Admitting: Internal Medicine

## 2022-02-27 ENCOUNTER — Encounter: Payer: Self-pay | Admitting: Internal Medicine

## 2022-02-27 ENCOUNTER — Other Ambulatory Visit
Admission: RE | Admit: 2022-02-27 | Discharge: 2022-02-27 | Disposition: A | Discharge status: Home / Self Care | Payer: Medicare HMO | Attending: Internal Medicine | Admitting: Internal Medicine

## 2022-02-27 VITALS — BP 114/60 | HR 58 | Ht 63.0 in | Wt 172.0 lb

## 2022-02-27 DIAGNOSIS — I428 Other cardiomyopathies: Secondary | ICD-10-CM

## 2022-02-27 DIAGNOSIS — M791 Myalgia, unspecified site: Secondary | ICD-10-CM | POA: Insufficient documentation

## 2022-02-27 DIAGNOSIS — I5022 Chronic systolic (congestive) heart failure: Secondary | ICD-10-CM | POA: Insufficient documentation

## 2022-02-27 DIAGNOSIS — Z95810 Presence of automatic (implantable) cardiac defibrillator: Secondary | ICD-10-CM

## 2022-02-27 DIAGNOSIS — Z79899 Other long term (current) drug therapy: Secondary | ICD-10-CM | POA: Diagnosis not present

## 2022-02-27 DIAGNOSIS — I447 Left bundle-branch block, unspecified: Secondary | ICD-10-CM

## 2022-02-27 LAB — PACEMAKER DEVICE OBSERVATION

## 2022-02-27 LAB — BASIC METABOLIC PANEL
Anion gap: 4 — ABNORMAL LOW (ref 5–15)
BUN: 15 mg/dL (ref 8–23)
CO2: 29 mmol/L (ref 22–32)
Calcium: 9.1 mg/dL (ref 8.9–10.3)
Chloride: 103 mmol/L (ref 98–111)
Creatinine, Ser: 0.66 mg/dL (ref 0.44–1.00)
GFR, Estimated: >60 mL/min (ref >60)
Glucose, Bld: 113 mg/dL — ABNORMAL HIGH (ref 70–99)
Potassium: 4.8 mmol/L (ref 3.5–5.1)
Sodium: 136 mmol/L (ref 135–145)

## 2022-02-27 LAB — SEDIMENTATION RATE: Sed Rate: 30 mm/hr (ref 0–30)

## 2022-02-27 NOTE — Patient Instructions (Addendum)
Medication Instructions:  ?- Your physician recommends that you continue on your current medications as directed. Please refer to the Current Medication list given to you today. ? ?*If you need a refill on your cardiac medications before your next appointment, please call your pharmacy* ? ? ?Lab Work: ?- Your physician recommends that you have lab work today: BMP/ ESR (sed rate)/ CRP (C-reactive protein) ? ? ?[facility] at [facility] ?1st desk on the right to check in (REGISTRATION)  ?Lab hours: Monday- Friday (7:30 am- 5:30 pm) ? ?If you have labs (blood work) drawn today and your tests are completely normal, you will receive your results only by: ?MyChart Message (if you have MyChart) OR ?A paper copy in the mail ?If you have any lab test that is abnormal or we need to change your treatment, we will call you to review the results. ? ? ?Testing/Procedures: ?- none ordered ? ? ?Follow-Up: ?At [facility], you and your health needs are our priority.  As part of our continuing mission to provide you with exceptional heart care, we have created designated Provider Care Teams.  These Care Teams include your primary Cardiologist (physician) and Advanced Practice Providers (APPs -  Physician Assistants and Nurse Practitioners) who all work together to provide you with the care you need, when you need it. ? ?We recommend signing up for the patient portal called "MyChart".  Sign up information is provided on this After Visit Summary.  MyChart is used to connect with patients for Virtual Visits (Telemedicine).  Patients are able to view lab/test results, encounter notes, upcoming appointments, etc.  Non-urgent messages can be sent to your provider as well.   ?To learn more about what you can do with MyChart, go to [email or web address].   ? ?Your next appointment:   ?1 year(s) ? ?The format for your next appointment:   ?In Person ? ?Provider:   ?Virl Axe, MD  ? ? ?Other Instructions ?N/a ? ?Important  Information About Sugar ? ? ? ? ? ? ?

## 2022-02-27 NOTE — Progress Notes (Signed)
? ? ? ? ?Patient Care Team: ?Perrin Maltese, MD as PCP - General (Internal Medicine) ? ? ?HPI ? ?Margaret Hendricks is a 66 y.o. female ?Seen in follow-up for CRT-D implanted 9/17 for nonischemic cardiomyopathy and congestive heart failure ? ? ?Not seen since 2021; but has been followed by ICM clinic ?  ?The patient denies chest pain, shortness of breath, nocturnal dyspnea, orthopnea or peripheral edema.  There have been no palpitations, lightheadedness or syncope.   ? ?Complains of soreness in her hips that awakens her at night.  Also discomfort in her left greater than right shoulder.  Takes naproxen twice daily ? ?[facility] ? ?DATE TEST EF   ?8/17 Echo  25 %   ?12/17 Echo  56 %   ?2/21 Echo   76% LAE severe   ? ?Date Cr K Hgb  ?7/19 0.87 4.0 11.4  ? 1/21   4.3 11.5  ?     ? ? ? ?Past Medical History:  ?Diagnosis Date  ? Abnormal heart rhythm   ? AICD (automatic cardioverter/defibrillator) present   ? Anemia   ? Cancer [facility])   ? skin  ? Chronic systolic CHF (congestive heart failure), NYHA class 2- 3 ([place]) 07/03/2016  ? COPD (chronic obstructive pulmonary disease) ([place])   ? Diabetes mellitus without complication ([place])   ? History of colonic polyps   ? LBBB (left bundle branch block) 07/03/2016  ? NICM (nonischemic cardiomyopathy) ([place]) 07/03/2016  ? Shortness of breath   ? ? ?Past Surgical History:  ?Procedure Laterality Date  ? APPENDECTOMY    ? CARDIAC CATHETERIZATION Left 03/14/2015  ? Procedure: Left Heart Cath;  Surgeon: Dionisio David, MD;  Location: [place] CV LAB;  Service: Cardiovascular;  Laterality: Left;  ? COLONOSCOPY WITH PROPOFOL N/A 05/12/2018  ? Procedure: COLONOSCOPY WITH PROPOFOL;  Surgeon: Lollie Sails, MD;  Location: [facility] ENDOSCOPY;  Service: Endoscopy;  Laterality: N/A;  ? EP IMPLANTABLE DEVICE N/A 07/16/2016  ? Procedure: BiV ICD Insertion CRT-D;  Surgeon: Deboraha Sprang, MD;  Location: [place] CV LAB;  Service: Cardiovascular;  Laterality: N/A;  ?  ESOPHAGOGASTRODUODENOSCOPY (EGD) WITH PROPOFOL N/A 05/12/2018  ? Procedure: ESOPHAGOGASTRODUODENOSCOPY (EGD) WITH PROPOFOL;  Surgeon: Lollie Sails, MD;  Location: [facility] ENDOSCOPY;  Service: Endoscopy;  Laterality: N/A;  ? ICD IMPLANT    ? INSERT / REPLACE / REMOVE PACEMAKER    ? TUBAL LIGATION Bilateral   ? ? ?Current Outpatient Medications  ?Medication Sig Dispense Refill  ? calcium carbonate (OSCAL) 1500 (600 Ca) MG TABS tablet Take by mouth 2 (two) times daily with a meal.    ? carvedilol (COREG) 12.5 MG tablet TAKE 1 TABLET TWICE DAILY (NEED TO MAKE APPOINTMENT FOR MORE REFILLS) 60 tablet 0  ? cholecalciferol (VITAMIN D) 1000 units tablet Take 1,000 Units by mouth daily.    ? cyclobenzaprine (FLEXERIL) 5 MG tablet Take 5 mg by mouth at bedtime.    ? ferrous sulfate 325 (65 FE) MG tablet Take 325 mg by mouth 2 (two) times daily with a meal.    ? furosemide (LASIX) 20 MG tablet Take 20 mg by mouth daily.    ? Menthol, Topical Analgesic, (BENGAY EX) Apply 1 application topically daily as needed. Back pain    ? metFORMIN (GLUCOPHAGE) 500 MG tablet Take 500 mg by mouth 2 (two) times daily with a meal.    ? Multiple Vitamins-Minerals (ICAPS AREDS FORMULA PO) Take 1 tablet by mouth daily.    ?  naproxen (NAPROSYN) 500 MG tablet Take 1 tablet (500 mg total) by mouth 2 (two) times daily with a meal. 20 tablet 00  ? polyethylene glycol-electrolytes (NULYTELY/GOLYTELY) 420 g solution Take 4,000 mLs by mouth once.    ? sacubitril-valsartan (ENTRESTO) 97-103 MG Take 1 tablet by mouth 2 (two) times daily.    ? spironolactone (ALDACTONE) 25 MG tablet Take 25 mg by mouth daily.    ? sulfamethoxazole-trimethoprim (BACTRIM DS) 800-160 MG tablet Take 1 tablet by mouth 2 (two) times daily. 20 tablet 0  ? ?No current facility-administered medications for this visit.  ? ? ?Allergies  ?Allergen Reactions  ? Tape Other (See Comments)  ?  Adhesive-silicones  ? ? ? ? ?Review of Systems negative except from HPI and PMH ? ?Physical  Exam ?BP 114/60 (BP Location: Left Arm, Patient Position: Sitting, Cuff Size: Normal)   Pulse (!) 58   Ht '5\' 3"'$  (1.6 m)   Wt 172 lb (78 kg)   SpO2 97%   BMI 30.47 kg/m?  ?Well developed and well nourished in no acute distress ?HENT normal ?Neck supple with JVP-flat ?Clear ?Device pocket well healed; without hematoma or erythema.  There is no tethering  ?Regular rate and rhythm, no murmur ?Abd-soft with active BS ?No Clubbing cyanosis  edema ?Skin-warm and dry ?A & Oriented  Grossly normal sensory and motor function ? ?ECG sinus with P synchronous pacing with a negative QRS lead I and rS lead V1 ? ? ? ?Assessment and  Plan ?CRT-D-[employer]  ? ?Nonischemic cardiomyopathy-resolved ? ?Left bundle branch block ? ?Congestive heart failure-chronic-diastolic    ? ?Hypertension with history of iatrogenic hypotension ? ?Myalgias thighs and left greater than right shoulder ?Device function is normal. ?No Programming changes ?See Paceart for details  ? ?Euvolemic.  We will continue her furosemide at 20. ? ?Blood pressure well controlled.  Cardiomyopathy has resolved.  She remains on spironolactone 25 Entresto 97/103 and carvedilol 12.5 twice daily ?  ?With myalgias, we will check a sed rate and CRP to exclude polymyalgia rheumatica; have suggested she take her naproxen just once a day ? ?

## 2022-02-28 LAB — HIGH SENSITIVITY CRP: CRP, High Sensitivity: 3.33 mg/L — ABNORMAL HIGH (ref 0.00–3.00)

## 2022-03-07 NOTE — Progress Notes (Signed)
Remote ICD transmission.   

## 2022-03-30 ENCOUNTER — Encounter: Payer: Self-pay | Admitting: Internal Medicine

## 2022-04-05 NOTE — Progress Notes (Signed)
No ICM remote transmission received for 04/02/2022 and next ICM transmission scheduled for 04/23/2022.    

## 2022-04-10 DIAGNOSIS — J449 Chronic obstructive pulmonary disease, unspecified: Secondary | ICD-10-CM | POA: Diagnosis not present

## 2022-04-10 DIAGNOSIS — D508 Other iron deficiency anemias: Secondary | ICD-10-CM | POA: Diagnosis not present

## 2022-04-10 DIAGNOSIS — I10 Essential (primary) hypertension: Secondary | ICD-10-CM | POA: Diagnosis not present

## 2022-04-10 DIAGNOSIS — G4733 Obstructive sleep apnea (adult) (pediatric): Secondary | ICD-10-CM | POA: Diagnosis not present

## 2022-04-10 DIAGNOSIS — E782 Mixed hyperlipidemia: Secondary | ICD-10-CM | POA: Diagnosis not present

## 2022-04-10 DIAGNOSIS — E119 Type 2 diabetes mellitus without complications: Secondary | ICD-10-CM | POA: Diagnosis not present

## 2022-04-10 DIAGNOSIS — I509 Heart failure, unspecified: Secondary | ICD-10-CM | POA: Diagnosis not present

## 2022-04-18 DIAGNOSIS — H35363 Drusen (degenerative) of macula, bilateral: Secondary | ICD-10-CM | POA: Diagnosis not present

## 2022-04-18 DIAGNOSIS — H18513 Endothelial corneal dystrophy, bilateral: Secondary | ICD-10-CM | POA: Diagnosis not present

## 2022-04-18 DIAGNOSIS — H2513 Age-related nuclear cataract, bilateral: Secondary | ICD-10-CM | POA: Diagnosis not present

## 2022-04-18 DIAGNOSIS — E119 Type 2 diabetes mellitus without complications: Secondary | ICD-10-CM | POA: Diagnosis not present

## 2022-04-23 ENCOUNTER — Ambulatory Visit (INDEPENDENT_AMBULATORY_CARE_PROVIDER_SITE_OTHER): Payer: Medicare HMO

## 2022-04-23 DIAGNOSIS — I5022 Chronic systolic (congestive) heart failure: Secondary | ICD-10-CM

## 2022-04-23 DIAGNOSIS — Z95810 Presence of automatic (implantable) cardiac defibrillator: Secondary | ICD-10-CM | POA: Diagnosis not present

## 2022-04-25 NOTE — Progress Notes (Signed)
EPIC Encounter for ICM Monitoring  Patient Name: Margaret Hendricks is a 66 y.o. female Date: 04/25/2022 Primary Care Physican: Perrin Maltese, MD Primary Cardiologist: Caryl Comes Electrophysiologist: Vergie Living Pacing:  98.3%                 04/25/2022 Weight: 173 lbs (baseline 169-170 lbs)   Time in AT/AF    0.0 hr/day (0.0%)         Spoke with patient and heart failure questions reviewed.  Pt's weight has increased by 3-4 lbs above the baseline of 169 in the last 2 weeks.    Optivol thoracic impedance suggesting possible fluid accumulation starting 6/5.   Prescribed:  Furosemide 20 mg 1 tablet daily Spironolactone 25 mg take 1 tablet daily   Labs: 02/27/2022 Creatinine 0.66, BUN 15, Potassium 4.9, Sodium 136, GFR >60 A complete set of results can be found in Results Review.   Recommendations: Advised to take extra 20 mg Lasix x 2 days and then return to 1 tablet daily   Follow-up plan: ICM clinic phone appointment on 04/27/2022 to recheck fluid levels.   91 day device clinic remote transmission 05/21/2022.     EP/Cardiology Office Visits: Recall 02/27/2023 with Dr. Caryl Comes.     Copy of ICM check sent to Dr. Caryl Comes.   3 month ICM trend: 04/23/2022.    12-14 Month ICM trend:     [name], RN 04/25/2022 10:47 AM

## 2022-04-27 ENCOUNTER — Ambulatory Visit (INDEPENDENT_AMBULATORY_CARE_PROVIDER_SITE_OTHER): Payer: Self-pay

## 2022-04-27 DIAGNOSIS — Z95810 Presence of automatic (implantable) cardiac defibrillator: Secondary | ICD-10-CM

## 2022-04-27 DIAGNOSIS — I5022 Chronic systolic (congestive) heart failure: Secondary | ICD-10-CM

## 2022-04-27 NOTE — Progress Notes (Signed)
EPIC Encounter for ICM Monitoring  Patient Name: Margaret Hendricks is a 66 y.o. female Date: 04/27/2022 Primary Care Physican: Perrin Maltese, MD Primary Cardiologist: Caryl Comes Electrophysiologist: Vergie Living Pacing:  98.3%                 04/25/2022 Weight: 173 lbs (baseline 169-170 lbs) 04/27/2022 Weight: 169 lbs   Time in AT/AF    0.0 hr/day (0.0%)          Spoke with patient and heart failure questions reviewed.  Pt reported weight returned to baseline after taking extra Furosemide.   Optivol thoracic impedance suggesting fluid levels returned to normal after taking extra Furosemide x 2 days.   Prescribed:  Furosemide 20 mg 1 tablet daily Spironolactone 25 mg take 1 tablet daily   Labs: 02/27/2022 Creatinine 0.66, BUN 15, Potassium 4.9, Sodium 136, GFR >60 A complete set of results can be found in Results Review.   Recommendations: No changes and encouraged to call if experiencing any fluid symptoms.   Follow-up plan: ICM clinic phone appointment on 05/28/2022.   91 day device clinic remote transmission 05/21/2022.     EP/Cardiology Office Visits: Recall 02/27/2023 with Dr. Caryl Comes.     Copy of ICM check sent to Dr. Caryl Comes.   3 month ICM trend: 04/27/2022.    12-14 Month ICM trend:     [name], RN 04/27/2022 11:02 AM

## 2022-05-21 ENCOUNTER — Ambulatory Visit (INDEPENDENT_AMBULATORY_CARE_PROVIDER_SITE_OTHER): Payer: Medicare HMO

## 2022-05-21 DIAGNOSIS — I428 Other cardiomyopathies: Secondary | ICD-10-CM

## 2022-05-21 LAB — CUP PACEART REMOTE DEVICE CHECK
Battery Remaining Longevity: 26 mo
Battery Voltage: 2.94 V
Brady Statistic AP VP Percent: 2.68 %
Brady Statistic AP VS Percent: 0.09 %
Brady Statistic AS VP Percent: 95.62 %
Brady Statistic AS VS Percent: 1.61 %
Brady Statistic RA Percent Paced: 2.77 %
Brady Statistic RV Percent Paced: 3.06 %
Date Time Interrogation Session: 20230724022824
HighPow Impedance: 65 Ohm
Implantable Lead Implant Date: 20170918
Implantable Lead Implant Date: 20170918
Implantable Lead Implant Date: 20170918
Implantable Lead Location: 753858
Implantable Lead Location: 753859
Implantable Lead Location: 753860
Implantable Lead Model: 5076
Implantable Pulse Generator Implant Date: 20170918
Lead Channel Impedance Value: 1007 Ohm
Lead Channel Impedance Value: 1064 Ohm
Lead Channel Impedance Value: 249.509
Lead Channel Impedance Value: 253.333
Lead Channel Impedance Value: 260.571
Lead Channel Impedance Value: 280.169
Lead Channel Impedance Value: 294.194
Lead Channel Impedance Value: 342 Ohm
Lead Channel Impedance Value: 399 Ohm
Lead Channel Impedance Value: 456 Ohm
Lead Channel Impedance Value: 456 Ohm
Lead Channel Impedance Value: 551 Ohm
Lead Channel Impedance Value: 570 Ohm
Lead Channel Impedance Value: 608 Ohm
Lead Channel Impedance Value: 931 Ohm
Lead Channel Impedance Value: 950 Ohm
Lead Channel Impedance Value: 950 Ohm
Lead Channel Impedance Value: 988 Ohm
Lead Channel Pacing Threshold Amplitude: 0.5 V
Lead Channel Pacing Threshold Amplitude: 0.625 V
Lead Channel Pacing Threshold Amplitude: 2 V
Lead Channel Pacing Threshold Pulse Width: 0.4 ms
Lead Channel Pacing Threshold Pulse Width: 0.4 ms
Lead Channel Pacing Threshold Pulse Width: 0.8 ms
Lead Channel Sensing Intrinsic Amplitude: 3 mV
Lead Channel Sensing Intrinsic Amplitude: 3 mV
Lead Channel Sensing Intrinsic Amplitude: 8 mV
Lead Channel Sensing Intrinsic Amplitude: 8 mV
Lead Channel Setting Pacing Amplitude: 2 V
Lead Channel Setting Pacing Amplitude: 2.5 V
Lead Channel Setting Pacing Amplitude: 2.75 V
Lead Channel Setting Pacing Pulse Width: 0.4 ms
Lead Channel Setting Pacing Pulse Width: 0.8 ms
Lead Channel Setting Sensing Sensitivity: 0.3 mV

## 2022-05-30 NOTE — Progress Notes (Signed)
No ICM remote transmission received for 05/28/2022 and next ICM transmission scheduled for 06/08/2022.

## 2022-06-08 NOTE — Progress Notes (Signed)
No ICM remote transmission received for 06/08/2022 and next ICM transmission scheduled for 06/25/2022.

## 2022-06-22 NOTE — Progress Notes (Signed)
Remote ICD transmission.   

## 2022-06-25 ENCOUNTER — Ambulatory Visit (INDEPENDENT_AMBULATORY_CARE_PROVIDER_SITE_OTHER): Payer: Medicare HMO

## 2022-06-25 DIAGNOSIS — I5022 Chronic systolic (congestive) heart failure: Secondary | ICD-10-CM | POA: Diagnosis not present

## 2022-06-25 DIAGNOSIS — Z95810 Presence of automatic (implantable) cardiac defibrillator: Secondary | ICD-10-CM | POA: Diagnosis not present

## 2022-06-26 DIAGNOSIS — E782 Mixed hyperlipidemia: Secondary | ICD-10-CM | POA: Diagnosis not present

## 2022-06-26 DIAGNOSIS — I509 Heart failure, unspecified: Secondary | ICD-10-CM | POA: Diagnosis not present

## 2022-06-26 DIAGNOSIS — R110 Nausea: Secondary | ICD-10-CM | POA: Diagnosis not present

## 2022-06-26 DIAGNOSIS — I10 Essential (primary) hypertension: Secondary | ICD-10-CM | POA: Diagnosis not present

## 2022-06-26 DIAGNOSIS — Z20822 Contact with and (suspected) exposure to covid-19: Secondary | ICD-10-CM | POA: Diagnosis not present

## 2022-06-26 DIAGNOSIS — R197 Diarrhea, unspecified: Secondary | ICD-10-CM | POA: Diagnosis not present

## 2022-06-26 DIAGNOSIS — D71 Functional disorders of polymorphonuclear neutrophils: Secondary | ICD-10-CM | POA: Diagnosis not present

## 2022-06-26 DIAGNOSIS — E119 Type 2 diabetes mellitus without complications: Secondary | ICD-10-CM | POA: Diagnosis not present

## 2022-06-26 DIAGNOSIS — R5383 Other fatigue: Secondary | ICD-10-CM | POA: Diagnosis not present

## 2022-06-26 DIAGNOSIS — R0602 Shortness of breath: Secondary | ICD-10-CM | POA: Diagnosis not present

## 2022-06-28 NOTE — Progress Notes (Signed)
EPIC Encounter for ICM Monitoring  Patient Name: Margaret Hendricks is a 66 y.o. female Date: 06/28/2022 Primary Care Physican: Perrin Maltese, MD Primary Cardiologist: Caryl Comes Electrophysiologist: Vergie Living Pacing:  98.4%                 04/25/2022 Weight: 173 lbs (baseline 169-170 lbs) 04/27/2022 Weight: 169 lbs   Time in AT/AF    0.0 hr/day (0.0%)          Transmission reviewed.   Optivol thoracic impedance suggesting normal fluid levels.   Prescribed:  Furosemide 20 mg 1 tablet daily Spironolactone 25 mg take 1 tablet daily   Labs: 02/27/2022 Creatinine 0.66, BUN 15, Potassium 4.9, Sodium 136, GFR >60 A complete set of results can be found in Results Review.   Recommendations: No changes.   Follow-up plan: ICM clinic phone appointment on 07/30/2022.   91 day device clinic remote transmission 08/20/2022.     EP/Cardiology Office Visits: Recall 02/27/2023 with Dr. Caryl Comes.     Copy of ICM check sent to Dr. Caryl Comes.  3 month ICM trend: 06/25/2022.    12-14 Month ICM trend:     [name], RN 06/28/2022 10:53 AM

## 2022-06-29 DIAGNOSIS — I10 Essential (primary) hypertension: Secondary | ICD-10-CM | POA: Diagnosis not present

## 2022-06-29 DIAGNOSIS — B2780 Other infectious mononucleosis without complication: Secondary | ICD-10-CM | POA: Diagnosis not present

## 2022-06-29 DIAGNOSIS — E119 Type 2 diabetes mellitus without complications: Secondary | ICD-10-CM | POA: Diagnosis not present

## 2022-06-29 DIAGNOSIS — E782 Mixed hyperlipidemia: Secondary | ICD-10-CM | POA: Diagnosis not present

## 2022-07-12 DIAGNOSIS — I340 Nonrheumatic mitral (valve) insufficiency: Secondary | ICD-10-CM | POA: Diagnosis not present

## 2022-07-12 DIAGNOSIS — R609 Edema, unspecified: Secondary | ICD-10-CM | POA: Diagnosis not present

## 2022-07-12 DIAGNOSIS — E782 Mixed hyperlipidemia: Secondary | ICD-10-CM | POA: Diagnosis not present

## 2022-07-12 DIAGNOSIS — E669 Obesity, unspecified: Secondary | ICD-10-CM | POA: Diagnosis not present

## 2022-07-12 DIAGNOSIS — I420 Dilated cardiomyopathy: Secondary | ICD-10-CM | POA: Diagnosis not present

## 2022-07-12 DIAGNOSIS — I10 Essential (primary) hypertension: Secondary | ICD-10-CM | POA: Diagnosis not present

## 2022-07-12 DIAGNOSIS — I509 Heart failure, unspecified: Secondary | ICD-10-CM | POA: Diagnosis not present

## 2022-07-30 ENCOUNTER — Ambulatory Visit (INDEPENDENT_AMBULATORY_CARE_PROVIDER_SITE_OTHER): Payer: Medicare HMO

## 2022-07-30 DIAGNOSIS — Z95810 Presence of automatic (implantable) cardiac defibrillator: Secondary | ICD-10-CM

## 2022-07-30 DIAGNOSIS — I5022 Chronic systolic (congestive) heart failure: Secondary | ICD-10-CM | POA: Diagnosis not present

## 2022-07-30 NOTE — Progress Notes (Signed)
EPIC Encounter for ICM Monitoring  Patient Name: Margaret Hendricks is a 66 y.o. female Date: 07/30/2022 Primary Care Physican: Perrin Maltese, MD Primary Cardiologist: Caryl Comes Electrophysiologist: Vergie Living Pacing:  98.4%                 04/25/2022 Weight: 173 lbs (baseline 169-170 lbs) 04/27/2022 Weight: 169 lbs   Time in AT/AF    0.0 hr/day (0.0%)          Spoke with patient and heart failure questions reviewed.  Transmission results reviewed.  Pt had mono within the last few weeks.      Optivol thoracic impedance suggesting possible fluid accumulation starting 9/26 and also 8/30-9/25.   Prescribed:  Furosemide 20 mg 1 tablet daily Spironolactone 25 mg take 1 tablet daily   Labs: 02/27/2022 Creatinine 0.66, BUN 15, Potassium 4.9, Sodium 136, GFR >60 A complete set of results can be found in Results Review.   Recommendations: She will take extra Furosemide tablet x 2 days and then return to 1 tablet daily.   Follow-up plan: ICM clinic phone appointment on 08/03/2022 (manual ) to recheck fluid levels.   91 day device clinic remote transmission 08/20/2022.     EP/Cardiology Office Visits: Recall 02/27/2023 with Dr. Caryl Comes.     Copy of ICM check sent to Dr. Caryl Comes.  3 month ICM trend: 07/30/2022.    12-14 Month ICM trend:     [name], RN 07/30/2022 8:17 AM

## 2022-08-03 ENCOUNTER — Telehealth: Payer: Self-pay

## 2022-08-03 NOTE — Telephone Encounter (Signed)
Spoke with patient and advised manual ICM remote transmission was not received.  She did not have time to send remote transmission due to she is working today.  Advised will reschedule remote transmission to recheck fluid levels on 10/23.  She is feeling fine and is not experiencing any fluid symptoms.

## 2022-08-09 DIAGNOSIS — G5602 Carpal tunnel syndrome, left upper limb: Secondary | ICD-10-CM | POA: Diagnosis not present

## 2022-08-09 DIAGNOSIS — E119 Type 2 diabetes mellitus without complications: Secondary | ICD-10-CM | POA: Diagnosis not present

## 2022-08-09 DIAGNOSIS — I10 Essential (primary) hypertension: Secondary | ICD-10-CM | POA: Diagnosis not present

## 2022-08-09 DIAGNOSIS — E782 Mixed hyperlipidemia: Secondary | ICD-10-CM | POA: Diagnosis not present

## 2022-08-09 DIAGNOSIS — I509 Heart failure, unspecified: Secondary | ICD-10-CM | POA: Diagnosis not present

## 2022-08-09 DIAGNOSIS — G4733 Obstructive sleep apnea (adult) (pediatric): Secondary | ICD-10-CM | POA: Diagnosis not present

## 2022-08-10 ENCOUNTER — Other Ambulatory Visit: Payer: Self-pay | Admitting: Internal Medicine

## 2022-08-10 DIAGNOSIS — N951 Menopausal and female climacteric states: Secondary | ICD-10-CM

## 2022-08-20 ENCOUNTER — Ambulatory Visit (INDEPENDENT_AMBULATORY_CARE_PROVIDER_SITE_OTHER): Payer: Medicare HMO

## 2022-08-20 DIAGNOSIS — I5022 Chronic systolic (congestive) heart failure: Secondary | ICD-10-CM

## 2022-08-20 DIAGNOSIS — Z95810 Presence of automatic (implantable) cardiac defibrillator: Secondary | ICD-10-CM

## 2022-08-21 ENCOUNTER — Telehealth: Payer: Self-pay

## 2022-08-21 LAB — CUP PACEART REMOTE DEVICE CHECK
Battery Remaining Longevity: 26 mo
Battery Voltage: 2.93 V
Brady Statistic AP VP Percent: 0.72 %
Brady Statistic AP VS Percent: 0.05 %
Brady Statistic AS VP Percent: 97.73 %
Brady Statistic AS VS Percent: 1.5 %
Brady Statistic RA Percent Paced: 0.76 %
Brady Statistic RV Percent Paced: 4.56 %
Date Time Interrogation Session: 20231023043723
HighPow Impedance: 79 Ohm
Implantable Lead Connection Status: 753985
Implantable Lead Connection Status: 753985
Implantable Lead Connection Status: 753985
Implantable Lead Implant Date: 20170918
Implantable Lead Implant Date: 20170918
Implantable Lead Implant Date: 20170918
Implantable Lead Location: 753858
Implantable Lead Location: 753859
Implantable Lead Location: 753860
Implantable Lead Model: 5076
Implantable Pulse Generator Implant Date: 20170918
Lead Channel Impedance Value: 1045 Ohm
Lead Channel Impedance Value: 241.412
Lead Channel Impedance Value: 253.333
Lead Channel Impedance Value: 253.333
Lead Channel Impedance Value: 270 Ohm
Lead Channel Impedance Value: 285 Ohm
Lead Channel Impedance Value: 380 Ohm
Lead Channel Impedance Value: 399 Ohm
Lead Channel Impedance Value: 437 Ohm
Lead Channel Impedance Value: 456 Ohm
Lead Channel Impedance Value: 513 Ohm
Lead Channel Impedance Value: 570 Ohm
Lead Channel Impedance Value: 570 Ohm
Lead Channel Impedance Value: 931 Ohm
Lead Channel Impedance Value: 950 Ohm
Lead Channel Impedance Value: 950 Ohm
Lead Channel Impedance Value: 950 Ohm
Lead Channel Impedance Value: 988 Ohm
Lead Channel Pacing Threshold Amplitude: 0.375 V
Lead Channel Pacing Threshold Amplitude: 0.625 V
Lead Channel Pacing Threshold Amplitude: 2.25 V
Lead Channel Pacing Threshold Pulse Width: 0.4 ms
Lead Channel Pacing Threshold Pulse Width: 0.4 ms
Lead Channel Pacing Threshold Pulse Width: 0.8 ms
Lead Channel Sensing Intrinsic Amplitude: 3 mV
Lead Channel Sensing Intrinsic Amplitude: 3 mV
Lead Channel Sensing Intrinsic Amplitude: 9 mV
Lead Channel Sensing Intrinsic Amplitude: 9 mV
Lead Channel Setting Pacing Amplitude: 2 V
Lead Channel Setting Pacing Amplitude: 2.5 V
Lead Channel Setting Pacing Amplitude: 2.75 V
Lead Channel Setting Pacing Pulse Width: 0.4 ms
Lead Channel Setting Pacing Pulse Width: 0.8 ms
Lead Channel Setting Sensing Sensitivity: 0.3 mV
Zone Setting Status: 755011

## 2022-08-21 NOTE — Telephone Encounter (Signed)
Remote ICM transmission received.  Attempted call to patient regarding ICM remote transmission and no answer.  

## 2022-08-21 NOTE — Progress Notes (Signed)
EPIC Encounter for ICM Monitoring  Patient Name: Margaret Hendricks is a 66 y.o. female Date: 08/21/2022 Primary Care Physican: Perrin Maltese, MD Primary Cardiologist: Caryl Comes Electrophysiologist: Vergie Living Pacing:  98.3%                 04/25/2022 Weight: 173 lbs (baseline 169-170 lbs) 04/27/2022 Weight: 169 lbs   Time in AT/AF    0.0 hr/day (0.0%)          Attempted call to patient and unable to reach.  Transmission reviewed.    Optivol thoracic impedance suggesting fluid levels returned to normal after taking extra Furosemide.   Prescribed:  Furosemide 20 mg 1 tablet daily Spironolactone 25 mg take 1 tablet daily   Labs: 02/27/2022 Creatinine 0.66, BUN 15, Potassium 4.9, Sodium 136, GFR >60 A complete set of results can be found in Results Review.   Recommendations:  Unable to reach.     Follow-up plan: ICM clinic phone appointment on 09/10/2022.   91 day device clinic remote transmission 11/19/2022.     EP/Cardiology Office Visits: Recall 02/27/2023 with Dr. Caryl Comes.     Copy of ICM check sent to Dr. Caryl Comes.  3 month ICM trend: 08/20/2022.    12-14 Month ICM trend:     [name], RN 08/21/2022 7:18 AM

## 2022-08-23 DIAGNOSIS — G5603 Carpal tunnel syndrome, bilateral upper limbs: Secondary | ICD-10-CM | POA: Diagnosis not present

## 2022-08-23 DIAGNOSIS — I10 Essential (primary) hypertension: Secondary | ICD-10-CM | POA: Diagnosis not present

## 2022-08-23 DIAGNOSIS — E119 Type 2 diabetes mellitus without complications: Secondary | ICD-10-CM | POA: Diagnosis not present

## 2022-08-23 DIAGNOSIS — G4733 Obstructive sleep apnea (adult) (pediatric): Secondary | ICD-10-CM | POA: Diagnosis not present

## 2022-08-23 DIAGNOSIS — I509 Heart failure, unspecified: Secondary | ICD-10-CM | POA: Diagnosis not present

## 2022-09-10 ENCOUNTER — Ambulatory Visit (INDEPENDENT_AMBULATORY_CARE_PROVIDER_SITE_OTHER): Payer: Medicare HMO

## 2022-09-10 DIAGNOSIS — I5022 Chronic systolic (congestive) heart failure: Secondary | ICD-10-CM

## 2022-09-10 DIAGNOSIS — Z95810 Presence of automatic (implantable) cardiac defibrillator: Secondary | ICD-10-CM | POA: Diagnosis not present

## 2022-09-12 NOTE — Progress Notes (Signed)
Remote ICD transmission.   

## 2022-09-14 NOTE — Progress Notes (Signed)
EPIC Encounter for ICM Monitoring  Patient Name: Viktorya Arguijo is a 66 y.o. female Date: 09/14/2022 Primary Care Physican: Perrin Maltese, MD Primary Cardiologist: Caryl Comes Electrophysiologist: Vergie Living Pacing:  98.4%                 04/25/2022 Weight: 173 lbs (baseline 169-170 lbs) 04/27/2022 Weight: 169 lbs   Time in AT/AF    0.0 hr/day (0.0%)          Transmission reviewed.    Optivol thoracic impedance suggesting normal fluid levels.   Prescribed:  Furosemide 20 mg 1 tablet daily Spironolactone 25 mg take 1 tablet daily   Labs: 02/27/2022 Creatinine 0.66, BUN 15, Potassium 4.9, Sodium 136, GFR >60 A complete set of results can be found in Results Review.   Recommendations:  No changes.   Follow-up plan: ICM clinic phone appointment on 10/15/2022.   91 day device clinic remote transmission 11/19/2022.     EP/Cardiology Office Visits: Recall 02/27/2023 with Dr. Caryl Comes.     Copy of ICM check sent to Dr. Caryl Comes.   3 month ICM trend: 09/10/2022.    12-14 Month ICM trend:     [name], RN 09/14/2022 2:15 PM

## 2022-10-01 DIAGNOSIS — Z0100 Encounter for examination of eyes and vision without abnormal findings: Secondary | ICD-10-CM | POA: Diagnosis not present

## 2022-10-01 DIAGNOSIS — H2513 Age-related nuclear cataract, bilateral: Secondary | ICD-10-CM | POA: Diagnosis not present

## 2022-10-15 ENCOUNTER — Ambulatory Visit (INDEPENDENT_AMBULATORY_CARE_PROVIDER_SITE_OTHER): Payer: Medicare HMO

## 2022-10-15 DIAGNOSIS — I5022 Chronic systolic (congestive) heart failure: Secondary | ICD-10-CM

## 2022-10-15 DIAGNOSIS — Z95810 Presence of automatic (implantable) cardiac defibrillator: Secondary | ICD-10-CM

## 2022-10-17 NOTE — Progress Notes (Signed)
EPIC Encounter for ICM Monitoring  Patient Name: Margaret Hendricks is a 67 y.o. female Date: 10/17/2022 Primary Care Physican: Perrin Maltese, MD Primary Cardiologist: Caryl Comes Electrophysiologist: Vergie Living Pacing:  98.3%                 04/25/2022 Weight: 173 lbs (baseline 169-170 lbs) 04/27/2022 Weight: 169 lbs   Time in AT/AF    0.0 hr/day (0.0%)          Spoke with patient and heart failure questions reviewed.  Transmission results reviewed.  Pt asymptomatic for fluid accumulation.  Reports feeling well at this time and voices no complaints.     Optivol thoracic impedance suggesting normal fluid levels.   Prescribed:  Furosemide 20 mg 1 tablet daily Spironolactone 25 mg take 1 tablet daily   Labs: 02/27/2022 Creatinine 0.66, BUN 15, Potassium 4.9, Sodium 136, GFR >60 A complete set of results can be found in Results Review.   Recommendations:  No changes and encouraged to call if experiencing any fluid symptoms.   Follow-up plan: ICM clinic phone appointment on 11/20/2022.   91 day device clinic remote transmission 11/19/2022.     EP/Cardiology Office Visits: Recall 02/27/2023 with Dr. Caryl Comes.     Copy of ICM check sent to Dr. Caryl Comes.   3 month ICM trend: 10/15/2022.    12-14 Month ICM trend:     [name], RN 10/17/2022 10:34 AM

## 2022-10-18 ENCOUNTER — Ambulatory Visit
Admission: RE | Admit: 2022-10-18 | Discharge: 2022-10-18 | Disposition: A | Discharge status: Home / Self Care | Payer: Medicare HMO | Source: Ambulatory Visit | Attending: Internal Medicine | Admitting: Internal Medicine

## 2022-10-18 DIAGNOSIS — M8589 Other specified disorders of bone density and structure, multiple sites: Secondary | ICD-10-CM | POA: Diagnosis not present

## 2022-10-18 DIAGNOSIS — Z780 Asymptomatic menopausal state: Secondary | ICD-10-CM | POA: Diagnosis not present

## 2022-10-18 DIAGNOSIS — Z13820 Encounter for screening for osteoporosis: Secondary | ICD-10-CM | POA: Diagnosis not present

## 2022-10-18 DIAGNOSIS — M85852 Other specified disorders of bone density and structure, left thigh: Secondary | ICD-10-CM | POA: Diagnosis not present

## 2022-10-18 DIAGNOSIS — N951 Menopausal and female climacteric states: Secondary | ICD-10-CM | POA: Insufficient documentation

## 2022-10-30 DIAGNOSIS — H18513 Endothelial corneal dystrophy, bilateral: Secondary | ICD-10-CM | POA: Diagnosis not present

## 2022-10-30 DIAGNOSIS — H2513 Age-related nuclear cataract, bilateral: Secondary | ICD-10-CM | POA: Diagnosis not present

## 2022-11-19 ENCOUNTER — Ambulatory Visit: Payer: Medicare HMO | Attending: Internal Medicine

## 2022-11-19 DIAGNOSIS — I428 Other cardiomyopathies: Secondary | ICD-10-CM

## 2022-11-19 DIAGNOSIS — I5022 Chronic systolic (congestive) heart failure: Secondary | ICD-10-CM

## 2022-11-20 ENCOUNTER — Ambulatory Visit: Payer: Medicare HMO | Attending: Internal Medicine

## 2022-11-20 DIAGNOSIS — Z95810 Presence of automatic (implantable) cardiac defibrillator: Secondary | ICD-10-CM | POA: Diagnosis not present

## 2022-11-20 DIAGNOSIS — I5022 Chronic systolic (congestive) heart failure: Secondary | ICD-10-CM | POA: Diagnosis not present

## 2022-11-20 DIAGNOSIS — I509 Heart failure, unspecified: Secondary | ICD-10-CM | POA: Diagnosis not present

## 2022-11-20 LAB — CUP PACEART REMOTE DEVICE CHECK
Battery Remaining Longevity: 27 mo
Battery Voltage: 2.93 V
Brady Statistic AP VP Percent: 0.61 %
Brady Statistic AP VS Percent: 0.03 %
Brady Statistic AS VP Percent: 97.88 %
Brady Statistic AS VS Percent: 1.48 %
Brady Statistic RA Percent Paced: 0.63 %
Brady Statistic RV Percent Paced: 3.2 %
Date Time Interrogation Session: 20240122062208
HighPow Impedance: 68 Ohm
Implantable Lead Connection Status: 753985
Implantable Lead Connection Status: 753985
Implantable Lead Connection Status: 753985
Implantable Lead Implant Date: 20170918
Implantable Lead Implant Date: 20170918
Implantable Lead Implant Date: 20170918
Implantable Lead Location: 753858
Implantable Lead Location: 753859
Implantable Lead Location: 753860
Implantable Lead Model: 5076
Implantable Pulse Generator Implant Date: 20170918
Lead Channel Impedance Value: 1045 Ohm
Lead Channel Impedance Value: 241.412
Lead Channel Impedance Value: 249.509
Lead Channel Impedance Value: 253.333
Lead Channel Impedance Value: 265.661
Lead Channel Impedance Value: 280.169
Lead Channel Impedance Value: 380 Ohm
Lead Channel Impedance Value: 399 Ohm
Lead Channel Impedance Value: 399 Ohm
Lead Channel Impedance Value: 456 Ohm
Lead Channel Impedance Value: 513 Ohm
Lead Channel Impedance Value: 551 Ohm
Lead Channel Impedance Value: 570 Ohm
Lead Channel Impedance Value: 893 Ohm
Lead Channel Impedance Value: 893 Ohm
Lead Channel Impedance Value: 931 Ohm
Lead Channel Impedance Value: 950 Ohm
Lead Channel Impedance Value: 950 Ohm
Lead Channel Pacing Threshold Amplitude: 0.5 V
Lead Channel Pacing Threshold Amplitude: 0.625 V
Lead Channel Pacing Threshold Amplitude: 2.5 V
Lead Channel Pacing Threshold Pulse Width: 0.4 ms
Lead Channel Pacing Threshold Pulse Width: 0.4 ms
Lead Channel Pacing Threshold Pulse Width: 0.8 ms
Lead Channel Sensing Intrinsic Amplitude: 2.625 mV
Lead Channel Sensing Intrinsic Amplitude: 2.625 mV
Lead Channel Sensing Intrinsic Amplitude: 8.625 mV
Lead Channel Sensing Intrinsic Amplitude: 8.625 mV
Lead Channel Setting Pacing Amplitude: 2 V
Lead Channel Setting Pacing Amplitude: 2.5 V
Lead Channel Setting Pacing Amplitude: 3.25 V
Lead Channel Setting Pacing Pulse Width: 0.4 ms
Lead Channel Setting Pacing Pulse Width: 0.8 ms
Lead Channel Setting Sensing Sensitivity: 0.3 mV
Zone Setting Status: 755011

## 2022-11-20 NOTE — Progress Notes (Signed)
EPIC Encounter for ICM Monitoring  Patient Name: Margaret Hendricks is a 67 y.o. female Date: 11/20/2022 Primary Care Physican: Perrin Maltese, MD Primary Cardiologist: Caryl Comes Electrophysiologist: Vergie Living Pacing:  98.3%                 04/25/2022 Weight: 173 lbs (baseline 169-170 lbs) 04/27/2022 Weight: 169 lbs 11/10/2022 Weight: 167 lbs 11/20/2022 Weight: 174 lbs   Time in AT/AF    0.0 hr/day (0.0%)          Spoke with patient and heart failure questions reviewed.  Transmission results reviewed.  Pt reports 7 lb weight gain in last 10 days.     Optivol thoracic impedance suggesting possible fluid accumulation starting 11/12/2022.   Prescribed:  Furosemide 20 mg 1 tablet daily Spironolactone 25 mg take 1 tablet daily   Labs: 02/27/2022 Creatinine 0.66, BUN 15, Potassium 4.9, Sodium 136, GFR >60 A complete set of results can be found in Results Review.   Recommendations:  Advised to take extra Furosemide 20 mg x 2-3 days.    Follow-up plan: ICM clinic phone appointment on 11/26/2022 to recheck fluid levels.   91 day device clinic remote transmission 02/18/2023.     EP/Cardiology Office Visits: Recall 02/27/2023 with Dr. Caryl Comes (needs updated DRP with correct phone number).     Copy of ICM check sent to Dr. Caryl Comes.   3 month ICM trend: 11/20/2022.    12-14 Month ICM trend:     [name], RN 11/20/2022 1:22 PM

## 2022-11-22 DIAGNOSIS — E782 Mixed hyperlipidemia: Secondary | ICD-10-CM | POA: Diagnosis not present

## 2022-11-22 DIAGNOSIS — I420 Dilated cardiomyopathy: Secondary | ICD-10-CM | POA: Diagnosis not present

## 2022-11-22 DIAGNOSIS — I10 Essential (primary) hypertension: Secondary | ICD-10-CM | POA: Diagnosis not present

## 2022-11-22 DIAGNOSIS — I509 Heart failure, unspecified: Secondary | ICD-10-CM | POA: Diagnosis not present

## 2022-11-22 DIAGNOSIS — I340 Nonrheumatic mitral (valve) insufficiency: Secondary | ICD-10-CM | POA: Diagnosis not present

## 2022-11-22 DIAGNOSIS — R609 Edema, unspecified: Secondary | ICD-10-CM | POA: Diagnosis not present

## 2022-11-22 DIAGNOSIS — E669 Obesity, unspecified: Secondary | ICD-10-CM | POA: Diagnosis not present

## 2022-11-26 ENCOUNTER — Ambulatory Visit: Payer: Medicare HMO | Attending: Internal Medicine

## 2022-11-26 ENCOUNTER — Other Ambulatory Visit: Payer: Self-pay | Admitting: Internal Medicine

## 2022-11-26 DIAGNOSIS — E782 Mixed hyperlipidemia: Secondary | ICD-10-CM | POA: Diagnosis not present

## 2022-11-26 DIAGNOSIS — Z95810 Presence of automatic (implantable) cardiac defibrillator: Secondary | ICD-10-CM

## 2022-11-26 DIAGNOSIS — I509 Heart failure, unspecified: Secondary | ICD-10-CM | POA: Diagnosis not present

## 2022-11-26 DIAGNOSIS — I10 Essential (primary) hypertension: Secondary | ICD-10-CM | POA: Diagnosis not present

## 2022-11-26 DIAGNOSIS — I5022 Chronic systolic (congestive) heart failure: Secondary | ICD-10-CM

## 2022-11-26 DIAGNOSIS — E119 Type 2 diabetes mellitus without complications: Secondary | ICD-10-CM | POA: Diagnosis not present

## 2022-11-26 DIAGNOSIS — G4733 Obstructive sleep apnea (adult) (pediatric): Secondary | ICD-10-CM | POA: Diagnosis not present

## 2022-11-26 DIAGNOSIS — G5603 Carpal tunnel syndrome, bilateral upper limbs: Secondary | ICD-10-CM | POA: Diagnosis not present

## 2022-11-26 NOTE — Progress Notes (Unsigned)
EPIC Encounter for ICM Monitoring  Patient Name: Margaret Hendricks is a 67 y.o. female Date: 11/26/2022 Primary Care Physican: Perrin Maltese, MD Primary Cardiologist: Caryl Comes Electrophysiologist: Vergie Living Pacing:  98.5%                 04/25/2022 Weight: 173 lbs (baseline 169-170 lbs) 04/27/2022 Weight: 169 lbs 11/10/2022 Weight: 167 lbs 11/20/2022 Weight: 174 lbs 11/27/2022 Weight: 168 lbs   Time in AT/AF    0.0 hr/day (0.0%)        Spoke with patient and heart failure questions reviewed.  Transmission results reviewed.  Pt reports weight returned close to baseline and feeling better.     Optivol thoracic impedance suggesting fluid levels returned close to normal.   Prescribed:  Furosemide 20 mg 1 tablet daily Spironolactone 25 mg take 1 tablet daily   Labs: 02/27/2022 Creatinine 0.66, BUN 15, Potassium 4.9, Sodium 136, GFR >60 A complete set of results can be found in Results Review.   Recommendations:  No changes and encouraged to call if experiencing any fluid symptoms.   Follow-up plan: ICM clinic phone appointment on 12/24/2022.   91 day device clinic remote transmission 02/18/2023.     EP/Cardiology Office Visits: Recall 02/27/2023 with Dr. Caryl Comes (needs updated DRP with correct phone number).     Copy of ICM check sent to Dr. Caryl Comes.   3 month ICM trend: 11/26/2022.    12-14 Month ICM trend:     [name], RN 11/26/2022 3:10 PM

## 2022-11-27 DIAGNOSIS — H2513 Age-related nuclear cataract, bilateral: Secondary | ICD-10-CM | POA: Diagnosis not present

## 2022-11-27 DIAGNOSIS — H18513 Endothelial corneal dystrophy, bilateral: Secondary | ICD-10-CM | POA: Diagnosis not present

## 2022-11-27 LAB — CMP14+EGFR
ALT: 11 IU/L (ref 0–32)
AST: 16 IU/L (ref 0–40)
Albumin/Globulin Ratio: 1.8 (ref 1.2–2.2)
Albumin: 4.4 g/dL (ref 3.9–4.9)
Alkaline Phosphatase: 69 IU/L (ref 44–121)
BUN/Creatinine Ratio: 18 (ref 12–28)
BUN: 12 mg/dL (ref 8–27)
Bilirubin Total: 0.5 mg/dL (ref 0.0–1.2)
CO2: 22 mmol/L (ref 20–29)
Calcium: 9.4 mg/dL (ref 8.7–10.3)
Chloride: 101 mmol/L (ref 96–106)
Creatinine, Ser: 0.66 mg/dL (ref 0.57–1.00)
Globulin, Total: 2.4 g/dL (ref 1.5–4.5)
Glucose: 101 mg/dL — ABNORMAL HIGH (ref 70–99)
Potassium: 4.9 mmol/L (ref 3.5–5.2)
Sodium: 139 mmol/L (ref 134–144)
Total Protein: 6.8 g/dL (ref 6.0–8.5)
eGFR: 98 mL/min/{1.73_m2} (ref >59)

## 2022-11-27 LAB — LIPID PANEL W/O CHOL/HDL RATIO
Cholesterol, Total: 134 mg/dL (ref 100–199)
HDL: 60 mg/dL (ref >39)
LDL Chol Calc (NIH): 56 mg/dL (ref 0–99)
Triglycerides: 97 mg/dL (ref 0–149)
VLDL Cholesterol Cal: 18 mg/dL (ref 5–40)

## 2022-11-27 LAB — HGB A1C W/O EAG: Hgb A1c MFr Bld: 6.6 % — ABNORMAL HIGH (ref 4.8–5.6)

## 2022-11-28 ENCOUNTER — Other Ambulatory Visit: Payer: Self-pay | Admitting: Internal Medicine

## 2022-11-28 DIAGNOSIS — N951 Menopausal and female climacteric states: Secondary | ICD-10-CM

## 2022-11-28 DIAGNOSIS — Z1231 Encounter for screening mammogram for malignant neoplasm of breast: Secondary | ICD-10-CM

## 2022-11-30 DIAGNOSIS — E119 Type 2 diabetes mellitus without complications: Secondary | ICD-10-CM | POA: Diagnosis not present

## 2022-11-30 DIAGNOSIS — I110 Hypertensive heart disease with heart failure: Secondary | ICD-10-CM | POA: Diagnosis not present

## 2022-11-30 DIAGNOSIS — G4730 Sleep apnea, unspecified: Secondary | ICD-10-CM | POA: Diagnosis not present

## 2022-11-30 DIAGNOSIS — I5032 Chronic diastolic (congestive) heart failure: Secondary | ICD-10-CM | POA: Diagnosis not present

## 2022-11-30 DIAGNOSIS — K219 Gastro-esophageal reflux disease without esophagitis: Secondary | ICD-10-CM | POA: Diagnosis not present

## 2022-11-30 DIAGNOSIS — J449 Chronic obstructive pulmonary disease, unspecified: Secondary | ICD-10-CM | POA: Diagnosis not present

## 2022-11-30 DIAGNOSIS — H18512 Endothelial corneal dystrophy, left eye: Secondary | ICD-10-CM | POA: Diagnosis not present

## 2022-11-30 DIAGNOSIS — H18519 Endothelial corneal dystrophy, unspecified eye: Secondary | ICD-10-CM | POA: Diagnosis not present

## 2022-11-30 DIAGNOSIS — Z9989 Dependence on other enabling machines and devices: Secondary | ICD-10-CM | POA: Diagnosis not present

## 2022-11-30 DIAGNOSIS — H2512 Age-related nuclear cataract, left eye: Secondary | ICD-10-CM | POA: Diagnosis not present

## 2022-12-04 DIAGNOSIS — R202 Paresthesia of skin: Secondary | ICD-10-CM | POA: Diagnosis not present

## 2022-12-04 DIAGNOSIS — M542 Cervicalgia: Secondary | ICD-10-CM | POA: Diagnosis not present

## 2022-12-04 DIAGNOSIS — R200 Anesthesia of skin: Secondary | ICD-10-CM | POA: Diagnosis not present

## 2022-12-04 DIAGNOSIS — R29898 Other symptoms and signs involving the musculoskeletal system: Secondary | ICD-10-CM | POA: Diagnosis not present

## 2022-12-06 ENCOUNTER — Other Ambulatory Visit: Payer: Self-pay | Admitting: Internal Medicine

## 2022-12-06 DIAGNOSIS — E119 Type 2 diabetes mellitus without complications: Secondary | ICD-10-CM

## 2022-12-24 ENCOUNTER — Ambulatory Visit: Payer: Medicare HMO | Attending: Internal Medicine

## 2022-12-24 DIAGNOSIS — Z95810 Presence of automatic (implantable) cardiac defibrillator: Secondary | ICD-10-CM | POA: Diagnosis not present

## 2022-12-24 DIAGNOSIS — I5022 Chronic systolic (congestive) heart failure: Secondary | ICD-10-CM

## 2022-12-28 NOTE — Progress Notes (Signed)
EPIC Encounter for ICM Monitoring  Patient Name: Margaret Hendricks is a 67 y.o. female Date: 12/28/2022 Primary Care Physican: Perrin Maltese, MD Primary Cardiologist: Caryl Comes Electrophysiologist: Vergie Living Pacing:  98.1%                 11/20/2022 Weight: 174 lbs 11/27/2022 Weight: 168 lbs   Time in AT/AF    0.0 hr/day (0.0%)        Spoke with patient and heart failure questions reviewed.  Transmission results reviewed.  Pt she reports feeling well and denies any fluid symptoms.  She had eye surgery during time of decreased impedance.    Optivol thoracic impedance normal but was suggesting possible fluid accumulation from 2/7-2/16.   Prescribed:  Furosemide 20 mg 1 tablet daily Spironolactone 25 mg take 1 tablet daily   Labs: 11/26/2022 Creatinine 0.66, BUN 12, Potassium 4.9, Sodium 139, GFR 98 02/27/2022 Creatinine 0.66, BUN 15, Potassium 4.9, Sodium 136, GFR >60 A complete set of results can be found in Results Review.   Recommendations:  No changes and encouraged to call if experiencing any fluid symptoms.   Follow-up plan: ICM clinic phone appointment on 01/28/2023.   91 day device clinic remote transmission 02/18/2023.     EP/Cardiology Office Visits: Recall 02/27/2023 with Dr. Caryl Comes (needs updated DRP with correct phone number).     Copy of ICM check sent to Dr. Caryl Comes.   3 month ICM trend: 12/24/2022.    12-14 Month ICM trend:     [name], RN 12/28/2022 10:35 AM

## 2023-01-01 DIAGNOSIS — R200 Anesthesia of skin: Secondary | ICD-10-CM | POA: Diagnosis not present

## 2023-01-04 NOTE — Progress Notes (Signed)
Remote ICD transmission.   

## 2023-01-15 DIAGNOSIS — R29898 Other symptoms and signs involving the musculoskeletal system: Secondary | ICD-10-CM | POA: Diagnosis not present

## 2023-01-15 DIAGNOSIS — M542 Cervicalgia: Secondary | ICD-10-CM | POA: Diagnosis not present

## 2023-01-15 DIAGNOSIS — M65341 Trigger finger, right ring finger: Secondary | ICD-10-CM | POA: Diagnosis not present

## 2023-01-15 DIAGNOSIS — R202 Paresthesia of skin: Secondary | ICD-10-CM | POA: Diagnosis not present

## 2023-01-15 DIAGNOSIS — G5603 Carpal tunnel syndrome, bilateral upper limbs: Secondary | ICD-10-CM | POA: Diagnosis not present

## 2023-01-15 DIAGNOSIS — R200 Anesthesia of skin: Secondary | ICD-10-CM | POA: Diagnosis not present

## 2023-01-28 ENCOUNTER — Ambulatory Visit: Payer: Medicare HMO | Attending: Internal Medicine

## 2023-01-28 DIAGNOSIS — Z95810 Presence of automatic (implantable) cardiac defibrillator: Secondary | ICD-10-CM

## 2023-01-28 DIAGNOSIS — I5022 Chronic systolic (congestive) heart failure: Secondary | ICD-10-CM

## 2023-01-30 NOTE — Progress Notes (Signed)
EPIC Encounter for ICM Monitoring  Patient Name: Margaret Hendricks is a 67 y.o. female Date: 01/30/2023 Primary Care Physican: Perrin Maltese, MD Primary Cardiologist: Caryl Comes Electrophysiologist: Vergie Living Pacing:  98.1%                 11/20/2022 Weight: 174 lbs 11/27/2022 Weight: 168 lbs   Time in AT/AF    0.0 hr/day (0.0%)         Transmission reviewed.    Optivol thoracic impedance normal fluid levels.   Prescribed:  Furosemide 20 mg 1 tablet daily Spironolactone 25 mg take 1 tablet daily   Labs: 11/26/2022 Creatinine 0.66, BUN 12, Potassium 4.9, Sodium 139, GFR 98 02/27/2022 Creatinine 0.66, BUN 15, Potassium 4.9, Sodium 136, GFR >60 A complete set of results can be found in Results Review.   Recommendations:  No changes.   Follow-up plan: ICM clinic phone appointment on 03/04/2023.   91 day device clinic remote transmission 02/18/2023.     EP/Cardiology Office Visits: Recall 02/27/2023 with Dr. Caryl Comes (needs updated DRP with correct phone number).     Copy of ICM check sent to Dr. Caryl Comes.   3 month ICM trend: 01/28/2023.    12-14 Month ICM trend:     [name], RN 01/30/2023 2:06 PM

## 2023-01-31 ENCOUNTER — Other Ambulatory Visit: Payer: Self-pay | Admitting: Internal Medicine

## 2023-02-11 DIAGNOSIS — G5603 Carpal tunnel syndrome, bilateral upper limbs: Secondary | ICD-10-CM | POA: Diagnosis not present

## 2023-02-11 DIAGNOSIS — M65331 Trigger finger, right middle finger: Secondary | ICD-10-CM | POA: Diagnosis not present

## 2023-02-11 DIAGNOSIS — M65341 Trigger finger, right ring finger: Secondary | ICD-10-CM | POA: Diagnosis not present

## 2023-02-18 ENCOUNTER — Ambulatory Visit (INDEPENDENT_AMBULATORY_CARE_PROVIDER_SITE_OTHER): Payer: Medicare HMO

## 2023-02-18 DIAGNOSIS — I5022 Chronic systolic (congestive) heart failure: Secondary | ICD-10-CM

## 2023-02-18 DIAGNOSIS — I428 Other cardiomyopathies: Secondary | ICD-10-CM

## 2023-02-18 DIAGNOSIS — M542 Cervicalgia: Secondary | ICD-10-CM | POA: Diagnosis not present

## 2023-02-19 LAB — CUP PACEART REMOTE DEVICE CHECK
Battery Remaining Longevity: 24 mo
Battery Voltage: 2.92 V
Brady Statistic AP VP Percent: 1.02 %
Brady Statistic AP VS Percent: 0.05 %
Brady Statistic AS VP Percent: 97.45 %
Brady Statistic AS VS Percent: 1.48 %
Brady Statistic RA Percent Paced: 1.07 %
Brady Statistic RV Percent Paced: 1.88 %
Date Time Interrogation Session: 20240422053324
HighPow Impedance: 75 Ohm
Implantable Lead Connection Status: 753985
Implantable Lead Connection Status: 753985
Implantable Lead Connection Status: 753985
Implantable Lead Implant Date: 20170918
Implantable Lead Implant Date: 20170918
Implantable Lead Implant Date: 20170918
Implantable Lead Location: 753858
Implantable Lead Location: 753859
Implantable Lead Location: 753860
Implantable Lead Model: 5076
Implantable Pulse Generator Implant Date: 20170918
Lead Channel Impedance Value: 1064 Ohm
Lead Channel Impedance Value: 247 Ohm
Lead Channel Impedance Value: 260.473
Lead Channel Impedance Value: 272.552
Lead Channel Impedance Value: 272.552
Lead Channel Impedance Value: 289.049
Lead Channel Impedance Value: 380 Ohm
Lead Channel Impedance Value: 399 Ohm
Lead Channel Impedance Value: 437 Ohm
Lead Channel Impedance Value: 494 Ohm
Lead Channel Impedance Value: 494 Ohm
Lead Channel Impedance Value: 551 Ohm
Lead Channel Impedance Value: 608 Ohm
Lead Channel Impedance Value: 874 Ohm
Lead Channel Impedance Value: 893 Ohm
Lead Channel Impedance Value: 931 Ohm
Lead Channel Impedance Value: 988 Ohm
Lead Channel Impedance Value: 988 Ohm
Lead Channel Pacing Threshold Amplitude: 0.5 V
Lead Channel Pacing Threshold Amplitude: 0.75 V
Lead Channel Pacing Threshold Amplitude: 2.25 V
Lead Channel Pacing Threshold Pulse Width: 0.4 ms
Lead Channel Pacing Threshold Pulse Width: 0.4 ms
Lead Channel Pacing Threshold Pulse Width: 0.8 ms
Lead Channel Sensing Intrinsic Amplitude: 3 mV
Lead Channel Sensing Intrinsic Amplitude: 3 mV
Lead Channel Sensing Intrinsic Amplitude: 8.75 mV
Lead Channel Sensing Intrinsic Amplitude: 8.75 mV
Lead Channel Setting Pacing Amplitude: 2 V
Lead Channel Setting Pacing Amplitude: 2.5 V
Lead Channel Setting Pacing Amplitude: 3 V
Lead Channel Setting Pacing Pulse Width: 0.4 ms
Lead Channel Setting Pacing Pulse Width: 0.8 ms
Lead Channel Setting Sensing Sensitivity: 0.3 mV
Zone Setting Status: 755011

## 2023-02-25 ENCOUNTER — Encounter: Payer: Self-pay | Admitting: Internal Medicine

## 2023-02-25 ENCOUNTER — Ambulatory Visit (INDEPENDENT_AMBULATORY_CARE_PROVIDER_SITE_OTHER): Payer: Medicare HMO | Admitting: Internal Medicine

## 2023-02-25 VITALS — BP 128/74 | HR 83 | Ht 63.0 in | Wt 170.0 lb

## 2023-02-25 DIAGNOSIS — Z13820 Encounter for screening for osteoporosis: Secondary | ICD-10-CM | POA: Diagnosis not present

## 2023-02-25 DIAGNOSIS — E1165 Type 2 diabetes mellitus with hyperglycemia: Secondary | ICD-10-CM

## 2023-02-25 DIAGNOSIS — E782 Mixed hyperlipidemia: Secondary | ICD-10-CM

## 2023-02-25 DIAGNOSIS — Z1231 Encounter for screening mammogram for malignant neoplasm of breast: Secondary | ICD-10-CM

## 2023-02-25 DIAGNOSIS — M542 Cervicalgia: Secondary | ICD-10-CM

## 2023-02-25 DIAGNOSIS — I10 Essential (primary) hypertension: Secondary | ICD-10-CM | POA: Diagnosis not present

## 2023-02-25 DIAGNOSIS — D508 Other iron deficiency anemias: Secondary | ICD-10-CM | POA: Diagnosis not present

## 2023-02-25 LAB — POCT CBG (FASTING - GLUCOSE)-MANUAL ENTRY: Glucose Fasting, POC: 117 mg/dL — AB (ref 70–99)

## 2023-02-25 MED ORDER — DICLOFENAC SODIUM 1 % EX GEL
2.0000 g | Freq: Two times a day (BID) | CUTANEOUS | 1 refills | Status: DC
Start: 2023-02-25 — End: 2023-09-12

## 2023-02-25 MED ORDER — BACLOFEN 10 MG PO TABS
10.0000 mg | ORAL_TABLET | Freq: Every day | ORAL | 1 refills | Status: DC
Start: 2023-02-25 — End: 2023-09-12

## 2023-02-25 NOTE — Progress Notes (Signed)
Established Patient Office Visit  Subjective:  Patient ID: Margaret Hendricks, female    DOB: 31-May-1956  Age: 67 y.o. MRN: [id number]  Chief Complaint  Patient presents with   Follow-up    4 month follow up    Patient comes in for her follow-up today.  She is currently being taken care of of her orthopedic for her right middle trigger finger and carpal tunnel steroid injections.  She was evaluated by the neurologist and then referred to orthopedic for these treatments.  She still not feeling much better yet.  She also underwent cataract surgery of the left eye with some complication. Today patient complains of left-sided neck pain and muscle strain.  She is supposed to start physical therapy for that.  Will add baclofen and Voltaren gel.   No other concerns at this time.   Past Medical History:  Diagnosis Date   Abnormal heart rhythm    AICD (automatic cardioverter/defibrillator) present    Anemia    Cancer (HCC)    skin   Chronic systolic CHF (congestive heart failure), NYHA class 2- 3 (HCC) 07/03/2016   COPD (chronic obstructive pulmonary disease) (HCC)    Diabetes mellitus without complication (HCC)    History of colonic polyps    LBBB (left bundle branch block) 07/03/2016   NICM (nonischemic cardiomyopathy) (HCC) 07/03/2016   Shortness of breath     Past Surgical History:  Procedure Laterality Date   APPENDECTOMY     CARDIAC CATHETERIZATION Left 03/14/2015   Procedure: Left Heart Cath;  Surgeon: Laurier Nancy, MD;  Location: [facility] INVASIVE CV LAB;  Service: Cardiovascular;  Laterality: Left;   COLONOSCOPY WITH PROPOFOL N/A 05/12/2018   Procedure: COLONOSCOPY WITH PROPOFOL;  Surgeon: Christena Deem, MD;  Location: [facility] ENDOSCOPY;  Service: Endoscopy;  Laterality: N/A;   EP IMPLANTABLE DEVICE N/A 07/16/2016   Procedure: BiV ICD Insertion CRT-D;  Surgeon: Duke Salvia, MD;  Location: [facility] INVASIVE CV LAB;  Service: Cardiovascular;  Laterality: N/A;    ESOPHAGOGASTRODUODENOSCOPY (EGD) WITH PROPOFOL N/A 05/12/2018   Procedure: ESOPHAGOGASTRODUODENOSCOPY (EGD) WITH PROPOFOL;  Surgeon: Christena Deem, MD;  Location: [facility] ENDOSCOPY;  Service: Endoscopy;  Laterality: N/A;   ICD IMPLANT     INSERT / REPLACE / REMOVE PACEMAKER     TUBAL LIGATION Bilateral     Social History   Socioeconomic History   Marital status: Single    Spouse name: Not on file   Number of children: Not on file   Years of education: Not on file   Highest education level: Not on file  Occupational History   Not on file  Tobacco Use   Smoking status: Every Day    Packs/day: 0.10    Years: 30.00    Additional pack years: 0.00    Total pack years: 3.00    Types: Cigarettes   Smokeless tobacco: Never   Tobacco comments:    Mya admits she smokes 1/2 cigarette in am on the way to work and 1/2 cigarette on the way home from work.   Vaping Use   Vaping Use: Never used  Substance and Sexual Activity   Alcohol use: No    Alcohol/week: 0.0 standard drinks of alcohol   Drug use: Yes    Types: Marijuana   Sexual activity: Not on file  Other Topics Concern   Not on file  Social History Narrative   Not on file   Social Determinants of Health   Financial Resource Strain: Not on  file  Food Insecurity: Not on file  Transportation Needs: Not on file  Physical Activity: Not on file  Stress: Not on file  Social Connections: Not on file  Intimate Partner Violence: Not on file    Family History  Problem Relation Age of Onset   Hypertension Other    Heart attack Other    Breast cancer Neg Hx     Allergies  Allergen Reactions   Tape Other (See Comments)    Adhesive-silicones    Review of Systems  Constitutional:  Negative for chills, fever, malaise/fatigue and weight loss.  HENT: Negative.    Eyes:  Positive for blurred vision. Negative for double vision, photophobia, pain, discharge and redness.  Respiratory:  Negative for cough, hemoptysis, sputum  production, shortness of breath and wheezing.   Cardiovascular:  Negative for palpitations, orthopnea, claudication and leg swelling.  Gastrointestinal:  Negative for abdominal pain, blood in stool, constipation, diarrhea, heartburn, nausea and vomiting.  Genitourinary:  Negative for dysuria, flank pain, frequency, hematuria and urgency.  Musculoskeletal:  Positive for back pain and neck pain. Negative for falls, joint pain and myalgias.  Neurological:  Positive for focal weakness. Negative for dizziness, tingling, tremors, sensory change, speech change and headaches.  Psychiatric/Behavioral:  Negative for depression. The patient is not nervous/anxious.        Objective:   BP 128/74   Pulse 83   Ht 5\' 3"  (1.6 m)   Wt 170 lb (77.1 kg)   SpO2 95%   BMI 30.11 kg/m   Vitals:   02/25/23 1017  BP: 128/74  Pulse: 83  Height: 5\' 3"  (1.6 m)  Weight: 170 lb (77.1 kg)  SpO2: 95%  BMI (Calculated): 30.12    Physical Exam Vitals and nursing note reviewed.  Constitutional:      Appearance: Normal appearance. She is obese.  Neck:     Vascular: No carotid bruit.  Cardiovascular:     Rate and Rhythm: Normal rate and regular rhythm.     Pulses: Normal pulses.     Heart sounds: Normal heart sounds.  Pulmonary:     Effort: Pulmonary effort is normal.     Breath sounds: Normal breath sounds. No rhonchi or rales.  Chest:     Chest wall: No tenderness.  Abdominal:     General: Bowel sounds are normal. There is no distension.     Palpations: Abdomen is soft. There is no mass.     Tenderness: There is no guarding.  Musculoskeletal:        General: Tenderness (over left neck muscle) present. Normal range of motion.     Cervical back: Neck supple. Tenderness present.     Right lower leg: No edema.     Left lower leg: No edema.  Lymphadenopathy:     Cervical: No cervical adenopathy.  Skin:    General: Skin is warm.  Neurological:     General: No focal deficit present.     Mental  Status: She is alert and oriented to person, place, and time.  Psychiatric:        Mood and Affect: Mood normal.        Behavior: Behavior normal.      Results for orders placed or performed in visit on 02/25/23  POCT CBG (Fasting - Glucose)  Result Value Ref Range   Glucose Fasting, POC 117 (A) 70 - 99 mg/dL        Assessment & Plan:  Patient needs to be scheduled for mammogram  and bone density.  To continue her medications.  Fasting blood work. Problem List Items Addressed This Visit   None Visit Diagnoses     Type 2 diabetes mellitus with hyperglycemia, without long-term current use of insulin (HCC)    -  Primary   Relevant Orders   POCT CBG (Fasting - Glucose) (Completed)   Hemoglobin A1c   Screening for osteoporosis       Relevant Orders   DG Bone Density   Encounter for screening mammogram for malignant neoplasm of breast       Relevant Orders   MM 3D SCREENING MAMMOGRAM BILATERAL BREAST   Other iron deficiency anemia       Relevant Orders   CBC With Differential   Mixed hyperlipidemia       Relevant Orders   Lipid Panel w/o Chol/HDL Ratio   Essential hypertension, benign       Relevant Orders   CMP14+EGFR   Neck pain       Relevant Medications   baclofen (LIORESAL) 10 MG tablet   diclofenac Sodium (VOLTAREN) 1 % GEL       Return in about 3 months (around 05/27/2023).   Total time spent: 30 minutes  [name], MD  02/25/2023

## 2023-02-26 LAB — CBC WITH DIFFERENTIAL
Basophils Absolute: 0 10*3/uL (ref 0.0–0.2)
Basos: 0 %
EOS (ABSOLUTE): 0.1 10*3/uL (ref 0.0–0.4)
Eos: 1 %
Hematocrit: 33 % — ABNORMAL LOW (ref 34.0–46.6)
Hemoglobin: 10.9 g/dL — ABNORMAL LOW (ref 11.1–15.9)
Immature Grans (Abs): 0 10*3/uL (ref 0.0–0.1)
Immature Granulocytes: 0 %
Lymphocytes Absolute: 3.2 10*3/uL — ABNORMAL HIGH (ref 0.7–3.1)
Lymphs: 38 %
MCH: 30.4 pg (ref 26.6–33.0)
MCHC: 33 g/dL (ref 31.5–35.7)
MCV: 92 fL (ref 79–97)
Monocytes Absolute: 0.4 10*3/uL (ref 0.1–0.9)
Monocytes: 5 %
Neutrophils Absolute: 4.8 10*3/uL (ref 1.4–7.0)
Neutrophils: 56 %
RBC: 3.58 x10E6/uL — ABNORMAL LOW (ref 3.77–5.28)
RDW: 12.9 % (ref 11.7–15.4)
WBC: 8.5 10*3/uL (ref 3.4–10.8)

## 2023-02-26 LAB — CMP14+EGFR
ALT: 56 IU/L — ABNORMAL HIGH (ref 0–32)
AST: 42 IU/L — ABNORMAL HIGH (ref 0–40)
Albumin/Globulin Ratio: 1.7 (ref 1.2–2.2)
Albumin: 4.3 g/dL (ref 3.9–4.9)
Alkaline Phosphatase: 95 IU/L (ref 44–121)
BUN/Creatinine Ratio: 14 (ref 12–28)
BUN: 12 mg/dL (ref 8–27)
Bilirubin Total: 0.4 mg/dL (ref 0.0–1.2)
CO2: 23 mmol/L (ref 20–29)
Calcium: 9.2 mg/dL (ref 8.7–10.3)
Chloride: 101 mmol/L (ref 96–106)
Creatinine, Ser: 0.84 mg/dL (ref 0.57–1.00)
Globulin, Total: 2.5 g/dL (ref 1.5–4.5)
Glucose: 104 mg/dL — ABNORMAL HIGH (ref 70–99)
Potassium: 4.8 mmol/L (ref 3.5–5.2)
Sodium: 138 mmol/L (ref 134–144)
Total Protein: 6.8 g/dL (ref 6.0–8.5)
eGFR: 78 mL/min/{1.73_m2} (ref >59)

## 2023-02-26 LAB — LIPID PANEL W/O CHOL/HDL RATIO
Cholesterol, Total: 114 mg/dL (ref 100–199)
HDL: 57 mg/dL (ref >39)
LDL Chol Calc (NIH): 39 mg/dL (ref 0–99)
Triglycerides: 94 mg/dL (ref 0–149)
VLDL Cholesterol Cal: 18 mg/dL (ref 5–40)

## 2023-02-26 LAB — HEMOGLOBIN A1C
Est. average glucose Bld gHb Est-mCnc: 134 mg/dL
Hgb A1c MFr Bld: 6.3 % — ABNORMAL HIGH (ref 4.8–5.6)

## 2023-02-28 DIAGNOSIS — M542 Cervicalgia: Secondary | ICD-10-CM | POA: Diagnosis not present

## 2023-03-05 NOTE — Progress Notes (Signed)
Cardiology Office Note Date:  03/07/2023  Patient ID:  Margaret Hendricks, Margaret Hendricks [birth year], MRN [id number] PCP:  [name], MD  Cardiologist:  None Electrophysiologist: Sherryl Manges, MD   Chief Complaint: CRT-D follow-up  History of Present Illness: Margaret Hendricks is a 67 y.o. female with PMH notable for NICM, HFimpEF s/p CRT-D, HTN; seen today for Sherryl Manges, MD for routine electrophysiology followup.  Last saw Dr. Graciela Husbands 02/2022 after not being seen since 2021. She is followed regularly by ICM nurse.   She is overall doing well overall. 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 She has no SOB, dyspnea while working.   She did have one episode at work recently where she became more sweaty than normal. In retrospection, she did not eat breakfast that day and was much busier than normal at work.   she denies chest pain, palpitations, dyspnea, PND, orthopnea, nausea, vomiting, dizziness, syncope, edema, weight gain, or early satiety.     Device Information: MDT CRT-D, imp 06/2016; dx NICM, HFrEF, LBBB [employer] LV lead  Past Medical History:  Diagnosis Date   Abnormal heart rhythm    AICD (automatic cardioverter/defibrillator) present    Anemia    Cancer (HCC)    skin   Chronic systolic CHF (congestive heart failure), NYHA class 2- 3 (HCC) 07/03/2016   COPD (chronic obstructive pulmonary disease) (HCC)    Diabetes mellitus without complication (HCC)    History of colonic polyps    LBBB (left bundle branch block) 07/03/2016   NICM (nonischemic cardiomyopathy) (HCC) 07/03/2016   Shortness of breath     Past Surgical History:  Procedure Laterality Date   APPENDECTOMY     CARDIAC CATHETERIZATION Left 03/14/2015   Procedure: Left Heart Cath;  Surgeon: Laurier Nancy, MD;  Location: [facility] INVASIVE CV LAB;  Service: Cardiovascular;  Laterality: Left;   COLONOSCOPY WITH PROPOFOL N/A 05/12/2018   Procedure: COLONOSCOPY WITH  PROPOFOL;  Surgeon: Christena Deem, MD;  Location: [facility] ENDOSCOPY;  Service: Endoscopy;  Laterality: N/A;   EP IMPLANTABLE DEVICE N/A 07/16/2016   Procedure: BiV ICD Insertion CRT-D;  Surgeon: Duke Salvia, MD;  Location: [facility] INVASIVE CV LAB;  Service: Cardiovascular;  Laterality: N/A;   ESOPHAGOGASTRODUODENOSCOPY (EGD) WITH PROPOFOL N/A 05/12/2018   Procedure: ESOPHAGOGASTRODUODENOSCOPY (EGD) WITH PROPOFOL;  Surgeon: Christena Deem, MD;  Location: [facility] ENDOSCOPY;  Service: Endoscopy;  Laterality: N/A;   ICD IMPLANT     INSERT / REPLACE / REMOVE PACEMAKER     TUBAL LIGATION Bilateral     Current Outpatient Medications  Medication Instructions   amitriptyline (ELAVIL) 10 mg, Oral, Daily at bedtime   baclofen (LIORESAL) 10 mg, Oral, Daily   calcium carbonate (OSCAL) 1500 (600 Ca) MG TABS tablet Oral, 2 times daily with meals   carvedilol (COREG) 12.5 MG tablet TAKE 1 TABLET TWICE DAILY (NEED TO MAKE APPOINTMENT FOR MORE REFILLS)   celecoxib (CELEBREX) 200 MG capsule TAKE 1 CAPSULE ONE TIME DAILY   cholecalciferol (VITAMIN D) 1,000 Units, Oral, Daily   diclofenac Sodium (VOLTAREN) 2 g, Topical, 2 times daily   ferrous sulfate 325 mg, Oral, 2 times daily with meals   furosemide (LASIX) 20 mg, Oral, Daily   Menthol, Topical Analgesic, (BENGAY EX) 1 application , Apply externally, Daily PRN, Back pain   metFORMIN (GLUCOPHAGE) 500 mg, Oral, Daily   Multiple Vitamins-Minerals (ICAPS AREDS FORMULA PO) 1 tablet, Oral, Daily   pantoprazole (PROTONIX) 40 mg, Oral,  2 times daily   sacubitril-valsartan (ENTRESTO) 97-103 MG 1 tablet, Oral, 2 times daily   spironolactone (ALDACTONE) 25 mg, Oral, Daily    Social History:  The patient  reports that she has been smoking cigarettes. She has a 3.00 pack-year smoking history. She has never used smokeless tobacco. She reports current drug use. Drug: Marijuana. She reports that she does not drink alcohol.   Family History:  The patient's family history  includes Heart attack in an other family member; Hypertension in an other family member.  ROS:  Please see the history of present illness. All other systems are reviewed and otherwise negative.   PHYSICAL EXAM:  VS:  BP 110/60 (BP Location: Left Arm, Patient Position: Sitting, Cuff Size: Normal)   Pulse 79   Ht 5' 2.5" (1.588 m)   Wt 171 lb (77.6 kg)   SpO2 98%   BMI 30.78 kg/m  BMI: Body mass index is 30.78 kg/m.  GEN- The patient is well appearing, alert and oriented x 3 today.   Lungs- Clear to ausculation bilaterally, normal work of breathing.  Heart- Regular rate and rhythm, no murmurs, rubs or gallops Extremities- No peripheral edema, warm, dry Skin-   device pocket well-healed, no tethering   Device interrogation done today and reviewed by myself:  Battery 2 years Lead thresholds, impedence, sensing stable  LV lead with chronically elevated threshold BiV Pacing 98% No episodes No changes made today  EKG is not ordered.   Recent Labs: 02/25/2023: ALT 56; BUN 12; Creatinine, Ser 0.84; Hemoglobin 10.9; Potassium 4.8; Sodium 138  02/25/2023: Cholesterol, Total 114; HDL 57; LDL Chol Calc (NIH) 39; Triglycerides 94   Estimated Creatinine Clearance: 66 mL/min (by C-G formula based on SCr of 0.84 mg/dL).   Wt Readings from Last 3 Encounters:  03/07/23 171 lb (77.6 kg)  02/25/23 170 lb (77.1 kg)  02/27/22 172 lb (78 kg)     Additional studies reviewed include: Previous EP, cardiology notes.   TTE, 05/22/2016 (scanned result)    ASSESSMENT AND PLAN:  #) HFimpEF Euvolemic on exam, good exercise tolerance NYHA I-II  GDMT: coreg, entresto 91-103, spiro Diuretic: 20mg  lasix daily    #) LBBB, NICM s/p MDT CRT-D Device functioning well, see paceart for details High VP percentage OptiVol low Thoracic impedence low   Current medicines are reviewed at length with the patient today.   The patient does not have concerns regarding her medicines.  The following  changes were made today:  none  Labs/ tests ordered today include:  No orders of the defined types were placed in this encounter.    Disposition: Follow up with Dr. Graciela Husbands or EP APP in 6 months   Signed, Sherie Don, NP  03/07/23  2:19 PM  Electrophysiology [facility]

## 2023-03-07 ENCOUNTER — Ambulatory Visit: Payer: Medicare HMO | Attending: Cardiology | Admitting: Cardiology

## 2023-03-07 VITALS — BP 110/60 | HR 79 | Ht 62.5 in | Wt 171.0 lb

## 2023-03-07 DIAGNOSIS — I428 Other cardiomyopathies: Secondary | ICD-10-CM

## 2023-03-07 DIAGNOSIS — I5032 Chronic diastolic (congestive) heart failure: Secondary | ICD-10-CM

## 2023-03-07 DIAGNOSIS — I447 Left bundle-branch block, unspecified: Secondary | ICD-10-CM | POA: Diagnosis not present

## 2023-03-07 DIAGNOSIS — Z95810 Presence of automatic (implantable) cardiac defibrillator: Secondary | ICD-10-CM

## 2023-03-07 DIAGNOSIS — M542 Cervicalgia: Secondary | ICD-10-CM | POA: Diagnosis not present

## 2023-03-07 LAB — CUP PACEART INCLINIC DEVICE CHECK
Date Time Interrogation Session: 20240509142424
Implantable Lead Connection Status: 753985
Implantable Lead Connection Status: 753985
Implantable Lead Connection Status: 753985
Implantable Lead Implant Date: 20170918
Implantable Lead Implant Date: 20170918
Implantable Lead Implant Date: 20170918
Implantable Lead Location: 753858
Implantable Lead Location: 753859
Implantable Lead Location: 753860
Implantable Lead Model: 5076
Implantable Pulse Generator Implant Date: 20170918

## 2023-03-07 NOTE — Patient Instructions (Signed)
Medication Instructions:  Your physician recommends that you continue on your current medications as directed. Please refer to the Current Medication list given to you today.  *If you need a refill on your cardiac medications before your next appointment, please call your pharmacy*   Lab Work: No labs ordered  If you have labs (blood work) drawn today and your tests are completely normal, you will receive your results only by: MyChart Message (if you have MyChart) OR A paper copy in the mail If you have any lab test that is abnormal or we need to change your treatment, we will call you to review the results.   Testing/Procedures: No testing ordered  Follow-Up: At [facility], you and your health needs are our priority.  As part of our continuing mission to provide you with exceptional heart care, we have created designated Provider Care Teams.  These Care Teams include your primary Cardiologist (physician) and Advanced Practice Providers (APPs -  Physician Assistants and Nurse Practitioners) who all work together to provide you with the care you need, when you need it.  We recommend signing up for the patient portal called "MyChart".  Sign up information is provided on this After Visit Summary.  MyChart is used to connect with patients for Virtual Visits (Telemedicine).  Patients are able to view lab/test results, encounter notes, upcoming appointments, etc.  Non-urgent messages can be sent to your provider as well.   To learn more about what you can do with MyChart, go to [email or web address].    Your next appointment:   6 month(s)  Provider:   Steven Klein, MD or Suzann Riddle, NP 

## 2023-03-08 NOTE — Progress Notes (Signed)
No ICM remote transmission received for 03/04/2023 and next ICM transmission scheduled for 03/26/2023.

## 2023-03-19 DIAGNOSIS — H18513 Endothelial corneal dystrophy, bilateral: Secondary | ICD-10-CM | POA: Diagnosis not present

## 2023-03-19 DIAGNOSIS — H2513 Age-related nuclear cataract, bilateral: Secondary | ICD-10-CM | POA: Diagnosis not present

## 2023-03-21 DIAGNOSIS — M542 Cervicalgia: Secondary | ICD-10-CM | POA: Diagnosis not present

## 2023-03-25 ENCOUNTER — Ambulatory Visit: Payer: Medicare HMO | Admitting: Cardiovascular Disease

## 2023-03-25 ENCOUNTER — Encounter: Payer: Self-pay | Admitting: Cardiovascular Disease

## 2023-03-25 VITALS — BP 112/80 | HR 63 | Ht 62.5 in | Wt 172.6 lb

## 2023-03-25 DIAGNOSIS — I428 Other cardiomyopathies: Secondary | ICD-10-CM | POA: Diagnosis not present

## 2023-03-25 DIAGNOSIS — I10 Essential (primary) hypertension: Secondary | ICD-10-CM | POA: Diagnosis not present

## 2023-03-25 DIAGNOSIS — I447 Left bundle-branch block, unspecified: Secondary | ICD-10-CM | POA: Diagnosis not present

## 2023-03-25 DIAGNOSIS — I5022 Chronic systolic (congestive) heart failure: Secondary | ICD-10-CM

## 2023-03-25 NOTE — Progress Notes (Signed)
Cardiology Office Note   Date:  03/25/2023   ID:  Margaret Hendricks, DOB [birth year], MRN [id number]  PCP:  [name], MD  Cardiologist:  Adrian Blackwater, MD      History of Present Illness: Margaret Hendricks is a 67 y.o. female who presents for  Chief Complaint  Patient presents with   Follow-up    4 month follow up    Doing well      Past Medical History:  Diagnosis Date   Abnormal heart rhythm    AICD (automatic cardioverter/defibrillator) present    Anemia    Cancer (HCC)    skin   Chronic systolic CHF (congestive heart failure), NYHA class 2- 3 (HCC) 07/03/2016   COPD (chronic obstructive pulmonary disease) (HCC)    Diabetes mellitus without complication (HCC)    History of colonic polyps    LBBB (left bundle branch block) 07/03/2016   NICM (nonischemic cardiomyopathy) (HCC) 07/03/2016   Shortness of breath      Past Surgical History:  Procedure Laterality Date   APPENDECTOMY     CARDIAC CATHETERIZATION Left 03/14/2015   Procedure: Left Heart Cath;  Surgeon: Laurier Nancy, MD;  Location: [facility] INVASIVE CV LAB;  Service: Cardiovascular;  Laterality: Left;   COLONOSCOPY WITH PROPOFOL N/A 05/12/2018   Procedure: COLONOSCOPY WITH PROPOFOL;  Surgeon: Christena Deem, MD;  Location: [facility] ENDOSCOPY;  Service: Endoscopy;  Laterality: N/A;   EP IMPLANTABLE DEVICE N/A 07/16/2016   Procedure: BiV ICD Insertion CRT-D;  Surgeon: Duke Salvia, MD;  Location: [facility] INVASIVE CV LAB;  Service: Cardiovascular;  Laterality: N/A;   ESOPHAGOGASTRODUODENOSCOPY (EGD) WITH PROPOFOL N/A 05/12/2018   Procedure: ESOPHAGOGASTRODUODENOSCOPY (EGD) WITH PROPOFOL;  Surgeon: Christena Deem, MD;  Location: [facility] ENDOSCOPY;  Service: Endoscopy;  Laterality: N/A;   ICD IMPLANT     INSERT / REPLACE / REMOVE PACEMAKER     TUBAL LIGATION Bilateral      Current Outpatient Medications  Medication Sig Dispense Refill   amitriptyline (ELAVIL) 10 MG tablet Take 10 mg by mouth at  bedtime.     baclofen (LIORESAL) 10 MG tablet Take 1 tablet (10 mg total) by mouth daily. 30 tablet 1   calcium carbonate (OSCAL) 1500 (600 Ca) MG TABS tablet Take by mouth 2 (two) times daily with a meal.     carvedilol (COREG) 12.5 MG tablet TAKE 1 TABLET TWICE DAILY (NEED TO MAKE APPOINTMENT FOR MORE REFILLS) 60 tablet 0   celecoxib (CELEBREX) 200 MG capsule TAKE 1 CAPSULE ONE TIME DAILY 90 capsule 3   cholecalciferol (VITAMIN D) 1000 units tablet Take 1,000 Units by mouth daily.     diclofenac Sodium (VOLTAREN) 1 % GEL Apply 2 g topically 2 (two) times daily. 350 g 1   ferrous sulfate 325 (65 FE) MG tablet Take 325 mg by mouth 2 (two) times daily with a meal.     furosemide (LASIX) 20 MG tablet Take 20 mg by mouth daily.     Menthol, Topical Analgesic, (BENGAY EX) Apply 1 application topically daily as needed. Back pain     metFORMIN (GLUCOPHAGE) 500 MG tablet TAKE 1 TABLET EVERY DAY 90 tablet 3   Multiple Vitamins-Minerals (ICAPS AREDS FORMULA PO) Take 1 tablet by mouth daily.     pantoprazole (PROTONIX) 40 MG tablet Take 40 mg by mouth 2 (two) times daily.     sacubitril-valsartan (ENTRESTO) 97-103 MG Take 1 tablet by mouth 2 (two) times daily.     spironolactone (  ALDACTONE) 25 MG tablet Take 25 mg by mouth daily.     No current facility-administered medications for this visit.    Allergies:   Tape    Social History:   reports that she has been smoking cigarettes. She has a 3.00 pack-year smoking history. She has never used smokeless tobacco. She reports current drug use. Drug: Marijuana. She reports that she does not drink alcohol.   Family History:  family history includes Heart attack in an other family member; Hypertension in an other family member.    ROS:     Review of Systems  Constitutional: Negative.   HENT: Negative.    Eyes: Negative.   Respiratory: Negative.    Gastrointestinal: Negative.   Genitourinary: Negative.   Musculoskeletal: Negative.   Skin: Negative.    Neurological: Negative.   Endo/Heme/Allergies: Negative.   Psychiatric/Behavioral: Negative.    All other systems reviewed and are negative.     All other systems are reviewed and negative.    PHYSICAL EXAM: VS:  BP 112/80   Pulse 63   Ht 5' 2.5" (1.588 m)   Wt 172 lb 9.6 oz (78.3 kg)   SpO2 96%   BMI 31.07 kg/m  , BMI Body mass index is 31.07 kg/m. Last weight:  Wt Readings from Last 3 Encounters:  03/25/23 172 lb 9.6 oz (78.3 kg)  03/07/23 171 lb (77.6 kg)  02/25/23 170 lb (77.1 kg)     Physical Exam Constitutional:      Appearance: Normal appearance.  Cardiovascular:     Rate and Rhythm: Normal rate and regular rhythm.     Heart sounds: Normal heart sounds.  Pulmonary:     Effort: Pulmonary effort is normal.     Breath sounds: Normal breath sounds.  Musculoskeletal:     Right lower leg: No edema.     Left lower leg: No edema.  Neurological:     Mental Status: She is alert.       EKG:   Recent Labs: 02/25/2023: ALT 56; BUN 12; Creatinine, Ser 0.84; Hemoglobin 10.9; Potassium 4.8; Sodium 138    Lipid Panel    Component Value Date/Time   CHOL 114 02/25/2023 1313   TRIG 94 02/25/2023 1313   HDL 57 02/25/2023 1313   CHOLHDL 2.7 03/14/2015 0357   VLDL 23 03/14/2015 0357   LDLCALC 39 02/25/2023 1313      REASON FOR VISIT  Visit for: Echocardiogram/I50.9  Sex:   Female  wt= 173   lbs.  BP=128/74  Height=63    inches.        TESTS  Imaging: Echocardiogram:  An echocardiogram in (2-d) mode was performed and in Doppler mode with color flow velocity mapping was performed. The aortic valve cusps are abnormal 1.3  cm, flow velocity 1.23   m/s, and systolic calculated mean flow gradient 4   mmHg. Mitral valve diastolic peak flow velocity E .75    m/s and E/A ratio 0.9. Aortic root diameter 2.6 cm. The LVOT internal diameter 2.7 cm and flow velocity was abnormal .722   m/s. LV systolic dimension 2.13  cm, diastolic 3.87  cm, posterior wall  thickness 1.17   cm, fractional shortening 45 %, and EF 77 %. IVS thickness 1.51  cm. LA dimension 3.9 cm. Mitral Valve has Mild Regurgitation. Tricuspid Valve has Mild Regurgitation.     ASSESSMENT  Technically adequate study.  Normal chamber sizes.  Mild left ventricular hypertrophy with GRADE 3 ( restrictive physiology) diastolic dysfunction.  Normal right ventricular systolic function.  Normal right ventricular diastolic function.  Normal left ventricular wall motion.  Normal right ventricular wall motion.  Mild tricuspid regurgitation.  Borderline pulmonary hypertension.  Mild mitral regurgitation.  No pericardial effusion.  Mildly dilated Left atrium  Severe LVH.     THERAPY   Referring physician: Laurier Nancy  Sonographer: Adrian Blackwater.      Adrian Blackwater MD  Electronically signed by: Adrian Blackwater     Date: 11/20/2022 13:33 REASON FOR VISIT  Referred by Dr.Pedram Goodchild Welton Flakes.        TESTS  Imaging: Computed Tomographic Angiography:  Cardiac multidetector CT was performed paying particular attention to the coronary arteries for the diagnosis of: Syncope and collapse. Diagnostic Drugs:  Administered iohexol (Omnipaque) through an antecubital vein and images from the examination were analyzed for the presence and extent of coronary artery disease, using 3D image processing software. 100 mL of non-ionic contrast (Omnipaque) was used.        TEST CONCLUSIONS  Quality of study: Suboptimal/Poor  1-Calcium score: 119.8  2-Left dominant system.  3-Normal coronaries. Technically difficult study due to pacemaker wire artifactis.      Adrian Blackwater MD  Electronically signed by: Adrian Blackwater     Date: 05/30/2020 15:13 Other studies Reviewed: Additional studies/ records that were reviewed today include:  Review of the above records demonstrates:      07/03/2016   10:52 AM  PAD Screen  Previous PAD dx? No  Previous surgical procedure? No  Pain with walking? Yes   Subsides with rest? Yes  Feet/toe relief with dangling? No  Painful, non-healing ulcers? No  Extremities discolored? No      ASSESSMENT AND PLAN:    ICD-10-CM   1. Chronic systolic CHF (congestive heart failure), NYHA class 2- 3 (HCC)  I50.22    LVEF normal 77% on echo done 1/24, but has AICD . Doing well on coreg and entresto.    2. LBBB (left bundle branch block)  I44.7     3. NICM (nonischemic cardiomyopathy) (HCC)  I42.8     4. Primary hypertension  I10        Problem List Items Addressed This Visit       Cardiovascular and Mediastinum   LBBB (left bundle branch block)   NICM (nonischemic cardiomyopathy) (HCC)   Chronic systolic CHF (congestive heart failure), NYHA class 2- 3 (HCC) - Primary   Other Visit Diagnoses     Primary hypertension              Disposition:   Return in about 3 months (around 06/25/2023).    Total time spent: 30 minutes  Signed,  Adrian Blackwater, MD  03/25/2023 9:34 AM    [facility]

## 2023-03-26 ENCOUNTER — Ambulatory Visit: Payer: Medicare HMO | Attending: Internal Medicine

## 2023-03-26 DIAGNOSIS — Z95810 Presence of automatic (implantable) cardiac defibrillator: Secondary | ICD-10-CM | POA: Diagnosis not present

## 2023-03-26 DIAGNOSIS — I5022 Chronic systolic (congestive) heart failure: Secondary | ICD-10-CM

## 2023-03-26 NOTE — Progress Notes (Signed)
Remote ICD transmission.   

## 2023-03-29 NOTE — Progress Notes (Signed)
EPIC Encounter for ICM Monitoring  Patient Name: Margaret Hendricks is a 67 y.o. female Date: 03/29/2023 Primary Care Physican: [name], MD Primary Cardiologist: Graciela Husbands Electrophysiologist: Joycelyn Schmid Pacing:  98.2%                 11/20/2022 Weight: 174 lbs 11/27/2022 Weight: 168 lbs   Time in AT/AF    0.0 hr/day (0.0%)         Spoke with patient and heart failure questions reviewed.  Transmission results reviewed.  Pt asymptomatic for fluid accumulation.  Reports feeling well at this time and voices no complaints.     Optivol thoracic impedance suggesting intermittent days with possible fluid accumulation within the last month.   Prescribed:  Furosemide 20 mg 1 tablet daily Spironolactone 25 mg take 1 tablet daily   Labs: 11/26/2022 Creatinine 0.66, BUN 12, Potassium 4.9, Sodium 139, GFR 98 02/27/2022 Creatinine 0.66, BUN 15, Potassium 4.9, Sodium 136, GFR >60 A complete set of results can be found in Results Review.   Recommendations:  No changes and encouraged to call if experiencing any fluid symptoms.   Follow-up plan: ICM clinic phone appointment on 04/29/2023.   91 day device clinic remote transmission 05/20/2023.     EP/Cardiology Office Visits:   08/30/2023 with Sherie Don, NP (6 month).       Copy of ICM check sent to Dr. Graciela Husbands.    3 month ICM trend: 03/26/2023.    12-14 Month ICM trend:     Karie Soda, RN 03/29/2023 7:37 AM

## 2023-04-15 ENCOUNTER — Ambulatory Visit
Admission: RE | Admit: 2023-04-15 | Discharge: 2023-04-15 | Disposition: A | Discharge status: Home / Self Care | Payer: Medicare HMO | Source: Ambulatory Visit | Attending: Internal Medicine | Admitting: Internal Medicine

## 2023-04-15 DIAGNOSIS — Z1231 Encounter for screening mammogram for malignant neoplasm of breast: Secondary | ICD-10-CM | POA: Insufficient documentation

## 2023-04-18 ENCOUNTER — Other Ambulatory Visit: Payer: Self-pay | Admitting: Internal Medicine

## 2023-05-03 NOTE — Progress Notes (Signed)
No ICM remote transmission received for 04/29/2023 and next ICM transmission scheduled for 05/13/2023.   

## 2023-05-20 ENCOUNTER — Ambulatory Visit: Payer: Medicare HMO

## 2023-05-20 DIAGNOSIS — M65341 Trigger finger, right ring finger: Secondary | ICD-10-CM | POA: Diagnosis not present

## 2023-05-20 DIAGNOSIS — R200 Anesthesia of skin: Secondary | ICD-10-CM | POA: Diagnosis not present

## 2023-05-20 DIAGNOSIS — G5603 Carpal tunnel syndrome, bilateral upper limbs: Secondary | ICD-10-CM | POA: Diagnosis not present

## 2023-05-20 DIAGNOSIS — R29898 Other symptoms and signs involving the musculoskeletal system: Secondary | ICD-10-CM | POA: Diagnosis not present

## 2023-05-20 DIAGNOSIS — R202 Paresthesia of skin: Secondary | ICD-10-CM | POA: Diagnosis not present

## 2023-05-21 ENCOUNTER — Ambulatory Visit: Payer: Medicare HMO | Attending: Internal Medicine

## 2023-05-21 DIAGNOSIS — Z95810 Presence of automatic (implantable) cardiac defibrillator: Secondary | ICD-10-CM | POA: Diagnosis not present

## 2023-05-21 DIAGNOSIS — I5022 Chronic systolic (congestive) heart failure: Secondary | ICD-10-CM

## 2023-05-22 NOTE — Progress Notes (Signed)
EPIC Encounter for ICM Monitoring  Patient Name: Margaret Hendricks is a 67 y.o. female Date: 05/22/2023 Primary Care Physican: [name], MD Primary Cardiologist: Graciela Husbands Electrophysiologist: Joycelyn Schmid Pacing:  98.4%                 11/20/2022 Weight: 174 lbs 11/27/2022 Weight: 168 lbs   Time in AT/AF    0.0 hr/day (0.0%)         Transmission reviewed.     Optivol thoracic impedance suggesting intermittent days with possible fluid accumulation within the last month.   Prescribed:  Furosemide 20 mg 1 tablet daily Spironolactone 25 mg take 1 tablet daily   Labs: 11/26/2022 Creatinine 0.66, BUN 12, Potassium 4.9, Sodium 139, GFR 98 02/27/2022 Creatinine 0.66, BUN 15, Potassium 4.9, Sodium 136, GFR >60 A complete set of results can be found in Results Review.   Recommendations:  No changes.   Follow-up plan: ICM clinic phone appointment on 06/24/2023.   91 day device clinic remote transmission 08/19/2023.     EP/Cardiology Office Visits:   08/30/2023 with Sherie Don, NP (6 month).       Copy of ICM check sent to Dr. Graciela Husbands.    3 month ICM trend: 05/20/2023.    12-14 Month ICM trend:     Karie Soda, RN 05/22/2023 2:14 PM

## 2023-05-28 ENCOUNTER — Ambulatory Visit: Payer: Medicare HMO | Admitting: Internal Medicine

## 2023-05-28 ENCOUNTER — Other Ambulatory Visit: Payer: Medicare HMO

## 2023-06-04 ENCOUNTER — Other Ambulatory Visit: Payer: Medicare HMO

## 2023-06-04 ENCOUNTER — Ambulatory Visit: Payer: Medicare HMO | Admitting: Internal Medicine

## 2023-06-11 ENCOUNTER — Encounter: Payer: Self-pay | Admitting: Internal Medicine

## 2023-06-11 ENCOUNTER — Ambulatory Visit (INDEPENDENT_AMBULATORY_CARE_PROVIDER_SITE_OTHER): Payer: Medicare HMO

## 2023-06-11 ENCOUNTER — Ambulatory Visit: Payer: Medicare HMO | Admitting: Internal Medicine

## 2023-06-11 VITALS — BP 134/72 | HR 66 | Ht 63.0 in | Wt 175.0 lb

## 2023-06-11 DIAGNOSIS — K219 Gastro-esophageal reflux disease without esophagitis: Secondary | ICD-10-CM | POA: Insufficient documentation

## 2023-06-11 DIAGNOSIS — M8589 Other specified disorders of bone density and structure, multiple sites: Secondary | ICD-10-CM | POA: Diagnosis not present

## 2023-06-11 DIAGNOSIS — I10 Essential (primary) hypertension: Secondary | ICD-10-CM | POA: Diagnosis not present

## 2023-06-11 DIAGNOSIS — E1165 Type 2 diabetes mellitus with hyperglycemia: Secondary | ICD-10-CM | POA: Diagnosis not present

## 2023-06-11 DIAGNOSIS — Z13820 Encounter for screening for osteoporosis: Secondary | ICD-10-CM

## 2023-06-11 DIAGNOSIS — E782 Mixed hyperlipidemia: Secondary | ICD-10-CM | POA: Insufficient documentation

## 2023-06-11 MED ORDER — PANTOPRAZOLE SODIUM 40 MG PO TBEC: 40.0000 mg | DELAYED_RELEASE_TABLET | Freq: Every day | ORAL | 3 refills | Status: DC

## 2023-06-11 NOTE — Progress Notes (Signed)
Established Patient Office Visit  Subjective:  Patient ID: Margaret Hendricks, female    DOB: Jul 27, 1956  Age: 67 y.o. MRN: [id number]  Chief Complaint  Patient presents with   Follow-up    3 month follow up    Patient comes in for her follow-up today.  She had her DEXA scan done earlier today which shows an improvement in her osteopenia.  She is advised to continue taking her Caltrate D and weightbearing exercises. She is still having trouble with her left eye and has to get another surgery done before she gets her right eye cataract surgery.  Otherwise she feels well and has no new complaints.    No other concerns at this time.   Past Medical History:  Diagnosis Date   Abnormal heart rhythm    AICD (automatic cardioverter/defibrillator) present    Anemia    Cancer (HCC)    skin   Chronic systolic CHF (congestive heart failure), NYHA class 2- 3 (HCC) 07/03/2016   COPD (chronic obstructive pulmonary disease) (HCC)    Diabetes mellitus without complication (HCC)    History of colonic polyps    LBBB (left bundle branch block) 07/03/2016   NICM (nonischemic cardiomyopathy) (HCC) 07/03/2016   Shortness of breath     Past Surgical History:  Procedure Laterality Date   APPENDECTOMY     CARDIAC CATHETERIZATION Left 03/14/2015   Procedure: Left Heart Cath;  Surgeon: Laurier Nancy, MD;  Location: [facility] INVASIVE CV LAB;  Service: Cardiovascular;  Laterality: Left;   COLONOSCOPY WITH PROPOFOL N/A 05/12/2018   Procedure: COLONOSCOPY WITH PROPOFOL;  Surgeon: Christena Deem, MD;  Location: [facility] ENDOSCOPY;  Service: Endoscopy;  Laterality: N/A;   EP IMPLANTABLE DEVICE N/A 07/16/2016   Procedure: BiV ICD Insertion CRT-D;  Surgeon: Duke Salvia, MD;  Location: [facility] INVASIVE CV LAB;  Service: Cardiovascular;  Laterality: N/A;   ESOPHAGOGASTRODUODENOSCOPY (EGD) WITH PROPOFOL N/A 05/12/2018   Procedure: ESOPHAGOGASTRODUODENOSCOPY (EGD) WITH PROPOFOL;  Surgeon: Christena Deem, MD;   Location: [facility] ENDOSCOPY;  Service: Endoscopy;  Laterality: N/A;   ICD IMPLANT     INSERT / REPLACE / REMOVE PACEMAKER     TUBAL LIGATION Bilateral     Social History   Socioeconomic History   Marital status: Single    Spouse name: Not on file   Number of children: Not on file   Years of education: Not on file   Highest education level: Not on file  Occupational History   Not on file  Tobacco Use   Smoking status: Every Day    Current packs/day: 0.10    Average packs/day: 0.1 packs/day for 30.0 years (3.0 ttl pk-yrs)    Types: Cigarettes   Smokeless tobacco: Never   Tobacco comments:    Margaret Hendricks admits she smokes 1/2 cigarette in am on the way to work and 1/2 cigarette on the way home from work.   Vaping Use   Vaping status: Never Used  Substance and Sexual Activity   Alcohol use: No    Alcohol/week: 0.0 standard drinks of alcohol   Drug use: Yes    Types: Marijuana   Sexual activity: Not on file  Other Topics Concern   Not on file  Social History Narrative   Not on file   Social Determinants of Health   Financial Resource Strain: Not on file  Food Insecurity: Not on file  Transportation Needs: Not on file  Physical Activity: Not on file  Stress: Not on file  Social  Connections: Not on file  Intimate Partner Violence: Not on file    Family History  Problem Relation Age of Onset   Hypertension Other    Heart attack Other    Breast cancer Neg Hx     Allergies  Allergen Reactions   Tape Other (See Comments)    Adhesive-silicones    Review of Systems  Constitutional: Negative.  Negative for chills, fever, malaise/fatigue and weight loss.  HENT: Negative.  Negative for ear pain, hearing loss, sinus pain and sore throat.   Eyes:  Positive for blurred vision.  Respiratory: Negative.  Negative for cough and shortness of breath.   Cardiovascular: Negative.  Negative for chest pain, palpitations and leg swelling.  Gastrointestinal: Negative.  Negative for  abdominal pain, constipation, diarrhea, heartburn, nausea and vomiting.  Genitourinary: Negative.  Negative for dysuria and flank pain.  Musculoskeletal: Negative.  Negative for joint pain and myalgias.  Skin: Negative.   Neurological: Negative.  Negative for dizziness and headaches.  Endo/Heme/Allergies: Negative.   Psychiatric/Behavioral: Negative.  Negative for depression and suicidal ideas. The patient is not nervous/anxious.        Objective:   BP 134/72   Pulse 66   Ht 5\' 3"  (1.6 m)   Wt 175 lb (79.4 kg)   SpO2 96%   BMI 31.00 kg/m   Vitals:   06/11/23 1350  BP: 134/72  Pulse: 66  Height: 5\' 3"  (1.6 m)  Weight: 175 lb (79.4 kg)  SpO2: 96%  BMI (Calculated): 31.01    Physical Exam Vitals and nursing note reviewed.  Constitutional:      Appearance: Normal appearance.  HENT:     Head: Normocephalic and atraumatic.     Nose: Nose normal.     Mouth/Throat:     Mouth: Mucous membranes are moist.     Pharynx: Oropharynx is clear.  Eyes:     Conjunctiva/sclera: Conjunctivae normal.     Pupils: Pupils are equal, round, and reactive to light.  Cardiovascular:     Rate and Rhythm: Normal rate and regular rhythm.     Pulses: Normal pulses.     Heart sounds: Normal heart sounds. No murmur heard. Pulmonary:     Effort: Pulmonary effort is normal.     Breath sounds: Normal breath sounds. No wheezing.  Abdominal:     General: Bowel sounds are normal.     Palpations: Abdomen is soft.     Tenderness: There is no abdominal tenderness. There is no right CVA tenderness or left CVA tenderness.  Musculoskeletal:        General: Normal range of motion.     Cervical back: Normal range of motion.     Right lower leg: No edema.     Left lower leg: No edema.  Skin:    General: Skin is warm and dry.  Neurological:     General: No focal deficit present.     Mental Status: She is alert and oriented to person, place, and time.  Psychiatric:        Mood and Affect: Mood normal.         Behavior: Behavior normal.      No results found for any visits on 06/11/23.  No results found for this or any previous visit (from the past 2160 hour(s)).    Assessment & Plan:  Patient will continue all her medications as such.  Get labs today. Problem List Items Addressed This Visit     Gastroesophageal reflux disease without  esophagitis   Relevant Medications   pantoprazole (PROTONIX) 40 MG tablet   Other Relevant Orders   CBC with Diff   Type 2 diabetes mellitus with hyperglycemia, without long-term current use of insulin (HCC)   Relevant Orders   Hemoglobin A1c   Mixed hyperlipidemia   Relevant Orders   Lipid Panel w/o Chol/HDL Ratio   Essential hypertension, benign - Primary   Relevant Orders   CMP14+EGFR    Return in about 3 months (around 09/11/2023).   Total time spent: 30 minutes  [name], MD  06/11/2023   This document may have been prepared by [employer] Voice Recognition software and as such may include unintentional dictation errors.

## 2023-06-18 ENCOUNTER — Other Ambulatory Visit: Payer: Medicare HMO

## 2023-06-18 DIAGNOSIS — E1165 Type 2 diabetes mellitus with hyperglycemia: Secondary | ICD-10-CM | POA: Diagnosis not present

## 2023-06-18 DIAGNOSIS — I10 Essential (primary) hypertension: Secondary | ICD-10-CM | POA: Diagnosis not present

## 2023-06-18 DIAGNOSIS — K219 Gastro-esophageal reflux disease without esophagitis: Secondary | ICD-10-CM

## 2023-06-18 DIAGNOSIS — E782 Mixed hyperlipidemia: Secondary | ICD-10-CM | POA: Diagnosis not present

## 2023-06-19 LAB — CMP14+EGFR
ALT: 15 IU/L (ref 0–32)
AST: 18 IU/L (ref 0–40)
Albumin: 4.3 g/dL (ref 3.9–4.9)
Alkaline Phosphatase: 65 IU/L (ref 44–121)
BUN/Creatinine Ratio: 24 (ref 12–28)
BUN: 18 mg/dL (ref 8–27)
Bilirubin Total: 0.2 mg/dL (ref 0.0–1.2)
CO2: 27 mmol/L (ref 20–29)
Calcium: 9.3 mg/dL (ref 8.7–10.3)
Chloride: 102 mmol/L (ref 96–106)
Creatinine, Ser: 0.76 mg/dL (ref 0.57–1.00)
Globulin, Total: 2.5 g/dL (ref 1.5–4.5)
Glucose: 111 mg/dL — ABNORMAL HIGH (ref 70–99)
Potassium: 4.4 mmol/L (ref 3.5–5.2)
Sodium: 141 mmol/L (ref 134–144)
Total Protein: 6.8 g/dL (ref 6.0–8.5)
eGFR: 87 mL/min/{1.73_m2} (ref >59)

## 2023-06-19 LAB — LIPID PANEL W/O CHOL/HDL RATIO
Cholesterol, Total: 112 mg/dL (ref 100–199)
HDL: 49 mg/dL (ref >39)
LDL Chol Calc (NIH): 38 mg/dL (ref 0–99)
Triglycerides: 149 mg/dL (ref 0–149)
VLDL Cholesterol Cal: 25 mg/dL (ref 5–40)

## 2023-06-19 LAB — CBC WITH DIFFERENTIAL/PLATELET
Basophils Absolute: 0 10*3/uL (ref 0.0–0.2)
Basos: 1 %
EOS (ABSOLUTE): 0.1 10*3/uL (ref 0.0–0.4)
Eos: 2 %
Hematocrit: 34.6 % (ref 34.0–46.6)
Hemoglobin: 11.1 g/dL (ref 11.1–15.9)
Immature Grans (Abs): 0 10*3/uL (ref 0.0–0.1)
Immature Granulocytes: 0 %
Lymphocytes Absolute: 2.6 10*3/uL (ref 0.7–3.1)
Lymphs: 43 %
MCH: 30.6 pg (ref 26.6–33.0)
MCHC: 32.1 g/dL (ref 31.5–35.7)
MCV: 95 fL (ref 79–97)
Monocytes Absolute: 0.3 10*3/uL (ref 0.1–0.9)
Monocytes: 6 %
Neutrophils Absolute: 2.9 10*3/uL (ref 1.4–7.0)
Neutrophils: 48 %
Platelets: 149 10*3/uL — ABNORMAL LOW (ref 150–450)
RBC: 3.63 x10E6/uL — ABNORMAL LOW (ref 3.77–5.28)
RDW: 13.8 % (ref 11.7–15.4)
WBC: 5.9 10*3/uL (ref 3.4–10.8)

## 2023-06-19 LAB — HEMOGLOBIN A1C
Est. average glucose Bld gHb Est-mCnc: 137 mg/dL
Hgb A1c MFr Bld: 6.4 % — ABNORMAL HIGH (ref 4.8–5.6)

## 2023-06-20 ENCOUNTER — Other Ambulatory Visit: Payer: Self-pay | Admitting: Internal Medicine

## 2023-06-20 DIAGNOSIS — D696 Thrombocytopenia, unspecified: Secondary | ICD-10-CM

## 2023-06-20 NOTE — Progress Notes (Signed)
Spoke with pt who was educated about platelets and verbalized understanding.

## 2023-06-24 ENCOUNTER — Ambulatory Visit: Payer: Medicare HMO | Attending: Internal Medicine

## 2023-06-24 DIAGNOSIS — I5022 Chronic systolic (congestive) heart failure: Secondary | ICD-10-CM

## 2023-06-24 DIAGNOSIS — Z95810 Presence of automatic (implantable) cardiac defibrillator: Secondary | ICD-10-CM

## 2023-06-25 ENCOUNTER — Ambulatory Visit: Payer: Medicare HMO | Admitting: Cardiovascular Disease

## 2023-06-27 ENCOUNTER — Encounter: Payer: Self-pay | Admitting: Cardiovascular Disease

## 2023-06-27 ENCOUNTER — Other Ambulatory Visit: Payer: Medicare HMO

## 2023-06-27 ENCOUNTER — Other Ambulatory Visit: Payer: Self-pay

## 2023-06-27 ENCOUNTER — Ambulatory Visit: Payer: Medicare HMO | Admitting: Cardiovascular Disease

## 2023-06-27 VITALS — BP 124/62 | HR 75 | Ht 63.0 in | Wt 172.8 lb

## 2023-06-27 DIAGNOSIS — I10 Essential (primary) hypertension: Secondary | ICD-10-CM

## 2023-06-27 DIAGNOSIS — E782 Mixed hyperlipidemia: Secondary | ICD-10-CM

## 2023-06-27 DIAGNOSIS — D696 Thrombocytopenia, unspecified: Secondary | ICD-10-CM

## 2023-06-27 DIAGNOSIS — I5022 Chronic systolic (congestive) heart failure: Secondary | ICD-10-CM

## 2023-06-27 DIAGNOSIS — K219 Gastro-esophageal reflux disease without esophagitis: Secondary | ICD-10-CM

## 2023-06-27 DIAGNOSIS — I428 Other cardiomyopathies: Secondary | ICD-10-CM | POA: Diagnosis not present

## 2023-06-27 DIAGNOSIS — D649 Anemia, unspecified: Secondary | ICD-10-CM | POA: Diagnosis not present

## 2023-06-27 DIAGNOSIS — I447 Left bundle-branch block, unspecified: Secondary | ICD-10-CM

## 2023-06-27 MED ORDER — PANTOPRAZOLE SODIUM 40 MG PO TBEC
40.0000 mg | DELAYED_RELEASE_TABLET | Freq: Every day | ORAL | 3 refills | Status: DC
Start: 2023-06-27 — End: 2024-06-08

## 2023-06-27 NOTE — Progress Notes (Signed)
Cardiology Office Note   Date:  06/27/2023   ID:  Margaret Hendricks, Margaret Hendricks 03/14/1956, MRN [id number]  PCP:  [name], MD  Cardiologist:  Adrian Blackwater, MD      History of Present Illness: Margaret Hendricks is a 67 y.o. female who presents for  Chief Complaint  Patient presents with   Follow-up    Feeling good.      Past Medical History:  Diagnosis Date   Abnormal heart rhythm    AICD (automatic cardioverter/defibrillator) present    Anemia    Cancer (HCC)    skin   Chronic systolic CHF (congestive heart failure), NYHA class 2- 3 (HCC) 07/03/2016   COPD (chronic obstructive pulmonary disease) (HCC)    Diabetes mellitus without complication (HCC)    History of colonic polyps    LBBB (left bundle branch block) 07/03/2016   NICM (nonischemic cardiomyopathy) (HCC) 07/03/2016   Shortness of breath      Past Surgical History:  Procedure Laterality Date   APPENDECTOMY     CARDIAC CATHETERIZATION Left 03/14/2015   Procedure: Left Heart Cath;  Surgeon: Laurier Nancy, MD;  Location: [facility] INVASIVE CV LAB;  Service: Cardiovascular;  Laterality: Left;   COLONOSCOPY WITH PROPOFOL N/A 05/12/2018   Procedure: COLONOSCOPY WITH PROPOFOL;  Surgeon: Christena Deem, MD;  Location: [facility] ENDOSCOPY;  Service: Endoscopy;  Laterality: N/A;   EP IMPLANTABLE DEVICE N/A 07/16/2016   Procedure: BiV ICD Insertion CRT-D;  Surgeon: Duke Salvia, MD;  Location: [facility] INVASIVE CV LAB;  Service: Cardiovascular;  Laterality: N/A;   ESOPHAGOGASTRODUODENOSCOPY (EGD) WITH PROPOFOL N/A 05/12/2018   Procedure: ESOPHAGOGASTRODUODENOSCOPY (EGD) WITH PROPOFOL;  Surgeon: Christena Deem, MD;  Location: [facility] ENDOSCOPY;  Service: Endoscopy;  Laterality: N/A;   ICD IMPLANT     INSERT / REPLACE / REMOVE PACEMAKER     TUBAL LIGATION Bilateral      Current Outpatient Medications  Medication Sig Dispense Refill   spironolactone (ALDACTONE) 25 MG tablet Take 25 mg by mouth daily.      amitriptyline (ELAVIL) 10 MG tablet Take 10 mg by mouth at bedtime.     atorvastatin (LIPITOR) 80 MG tablet TAKE 1 TABLET EVERY DAY 90 tablet 3   baclofen (LIORESAL) 10 MG tablet Take 1 tablet (10 mg total) by mouth daily. (Patient not taking: Reported on 06/11/2023) 30 tablet 1   calcium carbonate (OSCAL) 1500 (600 Ca) MG TABS tablet Take by mouth 2 (two) times daily with a meal.     carvedilol (COREG) 12.5 MG tablet TAKE 1 TABLET TWICE DAILY (NEED TO MAKE APPOINTMENT FOR MORE REFILLS) 60 tablet 0   celecoxib (CELEBREX) 200 MG capsule TAKE 1 CAPSULE ONE TIME DAILY 90 capsule 3   cholecalciferol (VITAMIN D) 1000 units tablet Take 1,000 Units by mouth daily.     Coenzyme Q10 (COQ-10) 30 MG CAPS Take by mouth.     diclofenac Sodium (VOLTAREN) 1 % GEL Apply 2 g topically 2 (two) times daily. (Patient not taking: Reported on 06/11/2023) 350 g 1   ferrous sulfate 325 (65 FE) MG tablet Take 325 mg by mouth 2 (two) times daily with a meal.     furosemide (LASIX) 20 MG tablet Take 20 mg by mouth daily.     gabapentin (NEURONTIN) 100 MG capsule Take 200 mg by mouth 2 (two) times daily.     Menthol, Topical Analgesic, (BENGAY EX) Apply 1 application topically daily as needed. Back pain (Patient not taking: Reported on  06/11/2023)     metFORMIN (GLUCOPHAGE) 500 MG tablet TAKE 1 TABLET EVERY DAY 90 tablet 3   Multiple Vitamins-Minerals (ICAPS AREDS FORMULA PO) Take 1 tablet by mouth daily. (Patient not taking: Reported on 06/11/2023)     pantoprazole (PROTONIX) 40 MG tablet Take 1 tablet (40 mg total) by mouth daily. 90 tablet 3   sacubitril-valsartan (ENTRESTO) 97-103 MG Take 1 tablet by mouth 2 (two) times daily.     sertraline (ZOLOFT) 50 MG tablet Take 50 mg by mouth daily.     No current facility-administered medications for this visit.    Allergies:   Tape    Social History:   reports that she has been smoking cigarettes. She has a 3 pack-year smoking history. She has never used smokeless tobacco. She  reports current drug use. Drug: Marijuana. She reports that she does not drink alcohol.   Family History:  family history includes Heart attack in an other family member; Hypertension in an other family member.    ROS:     Review of Systems  Constitutional: Negative.   HENT: Negative.    Eyes: Negative.   Respiratory: Negative.    Gastrointestinal: Negative.   Genitourinary: Negative.   Musculoskeletal: Negative.   Skin: Negative.   Neurological: Negative.   Endo/Heme/Allergies: Negative.   Psychiatric/Behavioral: Negative.    All other systems reviewed and are negative.     All other systems are reviewed and negative.    PHYSICAL EXAM: VS:  BP 124/62   Pulse 75   Ht 5\' 3"  (1.6 m)   Wt 172 lb 12.8 oz (78.4 kg)   SpO2 96%   BMI 30.61 kg/m  , BMI Body mass index is 30.61 kg/m. Last weight:  Wt Readings from Last 3 Encounters:  06/27/23 172 lb 12.8 oz (78.4 kg)  06/11/23 175 lb (79.4 kg)  03/25/23 172 lb 9.6 oz (78.3 kg)     Physical Exam Constitutional:      Appearance: Normal appearance.  Cardiovascular:     Rate and Rhythm: Normal rate and regular rhythm.     Heart sounds: Normal heart sounds.  Pulmonary:     Effort: Pulmonary effort is normal.     Breath sounds: Normal breath sounds.  Musculoskeletal:     Right lower leg: No edema.     Left lower leg: No edema.  Neurological:     Mental Status: She is alert.       EKG:   Recent Labs: 06/18/2023: ALT 15; BUN 18; Creatinine, Ser 0.76; Hemoglobin 11.1; Platelets 149; Potassium 4.4; Sodium 141    Lipid Panel    Component Value Date/Time   CHOL 112 06/18/2023 0917   TRIG 149 06/18/2023 0917   HDL 49 06/18/2023 0917   CHOLHDL 2.7 03/14/2015 0357   VLDL 23 03/14/2015 0357   LDLCALC 38 06/18/2023 0917      Other studies Reviewed: Additional studies/ records that were reviewed today include:  Review of the above records demonstrates:      07/03/2016   10:52 AM  PAD Screen  Previous PAD dx? No   Previous surgical procedure? No  Pain with walking? Yes  Subsides with rest? Yes  Feet/toe relief with dangling? No  Painful, non-healing ulcers? No  Extremities discolored? No      ASSESSMENT AND PLAN:    ICD-10-CM   1. Chronic systolic CHF (congestive heart failure), NYHA class 2- 3 (HCC)  I50.22    stable    2. Essential hypertension, benign  I10     3. LBBB (left bundle branch block)  I44.7     4. NICM (nonischemic cardiomyopathy) (HCC)  I42.8     5. Mixed hyperlipidemia  E78.2     6. Normocytic anemia  D64.9        Problem List Items Addressed This Visit       Cardiovascular and Mediastinum   LBBB (left bundle branch block)   NICM (nonischemic cardiomyopathy) (HCC)   Chronic systolic CHF (congestive heart failure), NYHA class 2- 3 (HCC) - Primary   Essential hypertension, benign     Other   Mixed hyperlipidemia   Other Visit Diagnoses     Normocytic anemia              Disposition:   Return in about 3 months (around 09/27/2023).    Total time spent: 35 minutes  Signed,  Adrian Blackwater, MD  06/27/2023 11:00 AM    [facility]

## 2023-06-28 ENCOUNTER — Other Ambulatory Visit: Payer: Self-pay | Admitting: Internal Medicine

## 2023-06-28 DIAGNOSIS — D696 Thrombocytopenia, unspecified: Secondary | ICD-10-CM

## 2023-06-28 LAB — CBC WITH DIFFERENTIAL/PLATELET
Basophils Absolute: 0 10*3/uL (ref 0.0–0.2)
Basos: 1 %
EOS (ABSOLUTE): 0.1 10*3/uL (ref 0.0–0.4)
Eos: 2 %
Hematocrit: 31.9 % — ABNORMAL LOW (ref 34.0–46.6)
Hemoglobin: 10.4 g/dL — ABNORMAL LOW (ref 11.1–15.9)
Immature Grans (Abs): 0 10*3/uL (ref 0.0–0.1)
Immature Granulocytes: 0 %
Lymphocytes Absolute: 3.3 10*3/uL — ABNORMAL HIGH (ref 0.7–3.1)
Lymphs: 57 %
MCH: 30.5 pg (ref 26.6–33.0)
MCHC: 32.6 g/dL (ref 31.5–35.7)
MCV: 94 fL (ref 79–97)
Monocytes Absolute: 0.3 10*3/uL (ref 0.1–0.9)
Monocytes: 5 %
Neutrophils Absolute: 2.1 10*3/uL (ref 1.4–7.0)
Neutrophils: 35 %
Platelets: 147 10*3/uL — ABNORMAL LOW (ref 150–450)
RBC: 3.41 x10E6/uL — ABNORMAL LOW (ref 3.77–5.28)
RDW: 13.9 % (ref 11.7–15.4)
WBC: 5.9 10*3/uL (ref 3.4–10.8)

## 2023-07-03 NOTE — Progress Notes (Signed)
Patient notified

## 2023-07-04 ENCOUNTER — Other Ambulatory Visit: Payer: Self-pay | Admitting: Internal Medicine

## 2023-07-04 NOTE — Progress Notes (Signed)
EPIC Encounter for ICM Monitoring  Patient Name: Margaret Hendricks is a 67 y.o. female Date: 07/04/2023 Primary Care Physican: [name], MD Primary Cardiologist: Graciela Husbands Electrophysiologist: Joycelyn Schmid Pacing:  98.3%                 11/20/2022 Weight: 174 lbs 11/27/2022 Weight: 168 lbs   Time in AT/AF    0.0 hr/day (0.0%)         Transmission reviewed.     Optivol thoracic impedance suggesting intermittent days with possible fluid accumulation within the last month.   Prescribed:  Furosemide 20 mg 1 tablet daily Spironolactone 25 mg take 1 tablet daily   Labs: 11/26/2022 Creatinine 0.66, BUN 12, Potassium 4.9, Sodium 139, GFR 98 02/27/2022 Creatinine 0.66, BUN 15, Potassium 4.9, Sodium 136, GFR >60 A complete set of results can be found in Results Review.   Recommendations:  No changes.   Follow-up plan: ICM clinic phone appointment on 08/05/2023.   91 day device clinic remote transmission 08/19/2023.     EP/Cardiology Office Visits:   08/30/2023 with Sherie Don, NP (6 month).       Copy of ICM check sent to Dr. Graciela Husbands.    3 month ICM trend: 06/24/2023.    12-14 Month ICM trend:     Karie Soda, RN 07/04/2023 5:07 PM

## 2023-07-29 ENCOUNTER — Other Ambulatory Visit: Payer: Medicare HMO

## 2023-07-29 DIAGNOSIS — D696 Thrombocytopenia, unspecified: Secondary | ICD-10-CM | POA: Diagnosis not present

## 2023-07-30 LAB — CBC WITH DIFFERENTIAL/PLATELET
Basophils Absolute: 0 10*3/uL (ref 0.0–0.2)
Basos: 1 %
EOS (ABSOLUTE): 0.1 10*3/uL (ref 0.0–0.4)
Eos: 2 %
Hematocrit: 35.1 % (ref 34.0–46.6)
Hemoglobin: 11.2 g/dL (ref 11.1–15.9)
Immature Grans (Abs): 0 10*3/uL (ref 0.0–0.1)
Immature Granulocytes: 0 %
Lymphocytes Absolute: 3.4 10*3/uL — ABNORMAL HIGH (ref 0.7–3.1)
Lymphs: 48 %
MCH: 31 pg (ref 26.6–33.0)
MCHC: 31.9 g/dL (ref 31.5–35.7)
MCV: 97 fL (ref 79–97)
Monocytes Absolute: 0.3 10*3/uL (ref 0.1–0.9)
Monocytes: 4 %
Neutrophils Absolute: 3.1 10*3/uL (ref 1.4–7.0)
Neutrophils: 45 %
Platelets: 171 10*3/uL (ref 150–450)
RBC: 3.61 x10E6/uL — ABNORMAL LOW (ref 3.77–5.28)
RDW: 13.4 % (ref 11.7–15.4)
WBC: 6.9 10*3/uL (ref 3.4–10.8)

## 2023-08-05 ENCOUNTER — Ambulatory Visit: Payer: Medicare HMO | Attending: Internal Medicine

## 2023-08-05 DIAGNOSIS — I5022 Chronic systolic (congestive) heart failure: Secondary | ICD-10-CM

## 2023-08-05 DIAGNOSIS — Z95810 Presence of automatic (implantable) cardiac defibrillator: Secondary | ICD-10-CM

## 2023-08-13 NOTE — Progress Notes (Signed)
EPIC Encounter for ICM Monitoring  Patient Name: Margaret Hendricks is a 67 y.o. female Date: 08/13/2023 Primary Care Physican: [name], MD Primary Cardiologist: Graciela Husbands Electrophysiologist: Joycelyn Schmid Pacing:  98.3%                 11/20/2022 Weight: 174 lbs 11/27/2022 Weight: 168 lbs 06/27/2023 Office Weight: 172 lbs   Time in AT/AF    0.0 hr/day (0.0%)         Transmission reviewed.     Optivol thoracic impedance suggesting intermittent days with possible fluid accumulation within the last month.   Prescribed:  Furosemide 20 mg 1 tablet daily Spironolactone 25 mg take 1 tablet daily   Labs: 11/26/2022 Creatinine 0.66, BUN 12, Potassium 4.9, Sodium 139, GFR 98 02/27/2022 Creatinine 0.66, BUN 15, Potassium 4.9, Sodium 136, GFR >60 A complete set of results can be found in Results Review.   Recommendations:  No changes.   Follow-up plan: ICM clinic phone appointment on  09/09/2023.   91 day device clinic remote transmission 08/19/2023.     EP/Cardiology Office Visits:   08/30/2023 with Sherie Don, NP (6 month).       Copy of ICM check sent to Dr. Graciela Husbands.    3 month ICM trend: 08/05/2023.    12-14 Month ICM trend:     Karie Soda, RN 08/13/2023 2:10 PM

## 2023-08-19 ENCOUNTER — Ambulatory Visit: Payer: Medicare HMO

## 2023-08-19 DIAGNOSIS — I5022 Chronic systolic (congestive) heart failure: Secondary | ICD-10-CM

## 2023-08-19 DIAGNOSIS — I428 Other cardiomyopathies: Secondary | ICD-10-CM

## 2023-08-20 DIAGNOSIS — R200 Anesthesia of skin: Secondary | ICD-10-CM | POA: Diagnosis not present

## 2023-08-20 DIAGNOSIS — R29898 Other symptoms and signs involving the musculoskeletal system: Secondary | ICD-10-CM | POA: Diagnosis not present

## 2023-08-20 DIAGNOSIS — G5603 Carpal tunnel syndrome, bilateral upper limbs: Secondary | ICD-10-CM | POA: Diagnosis not present

## 2023-08-20 DIAGNOSIS — M65341 Trigger finger, right ring finger: Secondary | ICD-10-CM | POA: Diagnosis not present

## 2023-08-20 DIAGNOSIS — M542 Cervicalgia: Secondary | ICD-10-CM | POA: Diagnosis not present

## 2023-08-20 DIAGNOSIS — R202 Paresthesia of skin: Secondary | ICD-10-CM | POA: Diagnosis not present

## 2023-08-20 LAB — CUP PACEART REMOTE DEVICE CHECK
Battery Remaining Longevity: 20 mo
Battery Voltage: 2.91 V
Brady Statistic AP VP Percent: 1.17 %
Brady Statistic AP VS Percent: 0.05 %
Brady Statistic AS VP Percent: 97.3 %
Brady Statistic AS VS Percent: 1.49 %
Brady Statistic RA Percent Paced: 1.21 %
Brady Statistic RV Percent Paced: 2.89 %
Date Time Interrogation Session: 20241021022823
HighPow Impedance: 71 Ohm
Implantable Lead Connection Status: 753985
Implantable Lead Connection Status: 753985
Implantable Lead Connection Status: 753985
Implantable Lead Implant Date: 20170918
Implantable Lead Implant Date: 20170918
Implantable Lead Implant Date: 20170918
Implantable Lead Location: 753858
Implantable Lead Location: 753859
Implantable Lead Location: 753860
Implantable Lead Model: 5076
Implantable Pulse Generator Implant Date: 20170918
Lead Channel Impedance Value: 1045 Ohm
Lead Channel Impedance Value: 1064 Ohm
Lead Channel Impedance Value: 1102 Ohm
Lead Channel Impedance Value: 265.661
Lead Channel Impedance Value: 278.237
Lead Channel Impedance Value: 285.934
Lead Channel Impedance Value: 289.049
Lead Channel Impedance Value: 313.212
Lead Channel Impedance Value: 399 Ohm
Lead Channel Impedance Value: 437 Ohm
Lead Channel Impedance Value: 494 Ohm
Lead Channel Impedance Value: 513 Ohm
Lead Channel Impedance Value: 551 Ohm
Lead Channel Impedance Value: 608 Ohm
Lead Channel Impedance Value: 646 Ohm
Lead Channel Impedance Value: 988 Ohm
Lead Channel Impedance Value: 988 Ohm
Lead Channel Impedance Value: 988 Ohm
Lead Channel Pacing Threshold Amplitude: 0.5 V
Lead Channel Pacing Threshold Amplitude: 0.625 V
Lead Channel Pacing Threshold Amplitude: 2.75 V
Lead Channel Pacing Threshold Pulse Width: 0.4 ms
Lead Channel Pacing Threshold Pulse Width: 0.4 ms
Lead Channel Pacing Threshold Pulse Width: 0.8 ms
Lead Channel Sensing Intrinsic Amplitude: 2.875 mV
Lead Channel Sensing Intrinsic Amplitude: 2.875 mV
Lead Channel Sensing Intrinsic Amplitude: 8.25 mV
Lead Channel Sensing Intrinsic Amplitude: 8.25 mV
Lead Channel Setting Pacing Amplitude: 2 V
Lead Channel Setting Pacing Amplitude: 2.5 V
Lead Channel Setting Pacing Amplitude: 3.25 V
Lead Channel Setting Pacing Pulse Width: 0.4 ms
Lead Channel Setting Pacing Pulse Width: 0.8 ms
Lead Channel Setting Sensing Sensitivity: 0.3 mV
Zone Setting Status: 755011

## 2023-08-28 NOTE — Progress Notes (Deleted)
Cardiology Office Note Date:  08/28/2023  Patient ID:  Margaret Hendricks, Margaret Hendricks 12-Apr-1956, MRN [id number] PCP:  [name], MD  Cardiologist:  None Electrophysiologist: Sherryl Manges, MD   Chief Complaint: 6mon CRT-D follow-up  History of Present Illness: Margaret Hendricks is a 67 y.o. female with PMH notable for NICM, HFimpEF s/p CRT-D, HTN; seen today for Sherryl Manges, MD for routine electrophysiology followup.  Last saw Dr. Graciela Husbands 02/2022 after not being seen since 2021. She is followed regularly by ICM nurse.  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  Saw Dr. Welton Flakes 05/2023, no changes made.   On follow-up today,  *** fluid status, exercise limitations  - consider updated echo  she denies chest pain, palpitations, dyspnea, PND, orthopnea, nausea, vomiting, dizziness, syncope, edema, weight gain, or early satiety.     Device Information: MDT CRT-D, imp 06/2016; dx NICM, HFrEF, LBBB [employer] LV lead  Past Medical History:  Diagnosis Date   Abnormal heart rhythm    AICD (automatic cardioverter/defibrillator) present    Anemia    Cancer (HCC)    skin   Chronic systolic CHF (congestive heart failure), NYHA class 2- 3 (HCC) 07/03/2016   COPD (chronic obstructive pulmonary disease) (HCC)    Diabetes mellitus without complication (HCC)    History of colonic polyps    LBBB (left bundle branch block) 07/03/2016   NICM (nonischemic cardiomyopathy) (HCC) 07/03/2016   Shortness of breath     Past Surgical History:  Procedure Laterality Date   APPENDECTOMY     CARDIAC CATHETERIZATION Left 03/14/2015   Procedure: Left Heart Cath;  Surgeon: Laurier Nancy, MD;  Location: [facility] INVASIVE CV LAB;  Service: Cardiovascular;  Laterality: Left;   COLONOSCOPY WITH PROPOFOL N/A 05/12/2018   Procedure: COLONOSCOPY WITH PROPOFOL;  Surgeon: Christena Deem, MD;  Location: [facility] ENDOSCOPY;  Service: Endoscopy;   Laterality: N/A;   EP IMPLANTABLE DEVICE N/A 07/16/2016   Procedure: BiV ICD Insertion CRT-D;  Surgeon: Duke Salvia, MD;  Location: [facility] INVASIVE CV LAB;  Service: Cardiovascular;  Laterality: N/A;   ESOPHAGOGASTRODUODENOSCOPY (EGD) WITH PROPOFOL N/A 05/12/2018   Procedure: ESOPHAGOGASTRODUODENOSCOPY (EGD) WITH PROPOFOL;  Surgeon: Christena Deem, MD;  Location: [facility] ENDOSCOPY;  Service: Endoscopy;  Laterality: N/A;   ICD IMPLANT     INSERT / REPLACE / REMOVE PACEMAKER     TUBAL LIGATION Bilateral     Current Outpatient Medications  Medication Instructions   amitriptyline (ELAVIL) 10 mg, Oral, Daily at bedtime   atorvastatin (LIPITOR) 80 mg, Oral, Daily   baclofen (LIORESAL) 10 mg, Oral, Daily   calcium carbonate (OSCAL) 1500 (600 Ca) MG TABS tablet Oral, 2 times daily with meals   carvedilol (COREG) 12.5 MG tablet TAKE 1 TABLET TWICE DAILY (NEED TO MAKE APPOINTMENT FOR MORE REFILLS)   celecoxib (CELEBREX) 200 MG capsule TAKE 1 CAPSULE ONE TIME DAILY   cholecalciferol (VITAMIN D) 1,000 Units, Oral, Daily   Coenzyme Q10 (COQ-10) 30 MG CAPS Oral   diclofenac Sodium (VOLTAREN) 2 g, Topical, 2 times daily   ferrous sulfate 325 mg, Oral, 2 times daily with meals   furosemide (LASIX) 20 MG tablet TAKE 1 TABLET EVERY DAY (NEED MD APPOINTMENT)   gabapentin (NEURONTIN) 200 mg, Oral, 2 times daily   Menthol, Topical Analgesic, (BENGAY EX) 1 application , Daily PRN   metFORMIN (GLUCOPHAGE) 500 mg, Oral, Daily   Multiple Vitamins-Minerals (ICAPS AREDS  FORMULA PO) 1 tablet, Daily   pantoprazole (PROTONIX) 40 mg, Oral, Daily   sacubitril-valsartan (ENTRESTO) 97-103 MG 1 tablet, Oral, 2 times daily   sertraline (ZOLOFT) 50 MG tablet TAKE 1 TABLET EVERY DAY (NEED MD APPOINTMENT)   spironolactone (ALDACTONE) 25 mg, Oral, Daily    Social History:  The patient  reports that she has been smoking cigarettes. She has a 3 pack-year smoking history. She has never used smokeless tobacco. She reports  current drug use. Drug: Marijuana. She reports that she does not drink alcohol.   Family History:  The patient's family history includes Heart attack in an other family member; Hypertension in an other family member.  ROS:  Please see the history of present illness. All other systems are reviewed and otherwise negative.   PHYSICAL EXAM:  VS:  There were no vitals taken for this visit. BMI: There is no height or weight on file to calculate BMI.  GEN- The patient is well appearing, alert and oriented x 3 today.   Lungs- Clear to ausculation bilaterally, normal work of breathing.  Heart- Regular rate and rhythm, no murmurs, rubs or gallops Extremities- No peripheral edema, warm, dry Skin-   device pocket well-healed, no tethering   Device interrogation done today and reviewed by myself:  Battery 2 years Lead thresholds, impedence, sensing stable  LV lead with chronically elevated threshold BiV Pacing 98% No episodes No changes made today  EKG is ordered. Personal review of EKG from today shows:  ***       Recent Labs: 06/18/2023: ALT 15; BUN 18; Creatinine, Ser 0.76; Potassium 4.4; Sodium 141 07/29/2023: Hemoglobin 11.2; Platelets 171  06/18/2023: Cholesterol, Total 112; HDL 49; LDL Chol Calc (NIH) 38; Triglycerides 149   CrCl cannot be calculated (Patient's most recent lab result is older than the maximum 21 days allowed.).   Wt Readings from Last 3 Encounters:  06/27/23 172 lb 12.8 oz (78.4 kg)  06/11/23 175 lb (79.4 kg)  03/25/23 172 lb 9.6 oz (78.3 kg)     Additional studies reviewed include: Previous EP, cardiology notes.   TTE, 05/22/2016 (scanned result)    ASSESSMENT AND PLAN:  #) HFimpEF Euvolemic on exam, good exercise tolerance NYHA I-II  GDMT: coreg, entresto 91-103, spiro Diuretic: 20mg  lasix daily    #) LBBB, NICM s/p MDT CRT-D Device functioning well, see paceart for details High VP percentage OptiVol low Thoracic impedence low   Current  medicines are reviewed at length with the patient today.   The patient does not have concerns regarding her medicines.  The following changes were made today:  none  Labs/ tests ordered today include:  No orders of the defined types were placed in this encounter.    Disposition: Follow up with Dr. Graciela Husbands or EP APP in 6 months   Signed, Sherie Don, NP  08/28/23  6:44 PM  Electrophysiology [facility]

## 2023-08-29 DIAGNOSIS — M65341 Trigger finger, right ring finger: Secondary | ICD-10-CM | POA: Diagnosis not present

## 2023-08-29 DIAGNOSIS — M65332 Trigger finger, left middle finger: Secondary | ICD-10-CM | POA: Diagnosis not present

## 2023-08-29 DIAGNOSIS — M65331 Trigger finger, right middle finger: Secondary | ICD-10-CM | POA: Diagnosis not present

## 2023-08-30 ENCOUNTER — Encounter: Payer: Self-pay | Admitting: Cardiology

## 2023-08-30 ENCOUNTER — Ambulatory Visit: Payer: Medicare HMO | Attending: Cardiology | Admitting: Cardiology

## 2023-08-30 DIAGNOSIS — Z95810 Presence of automatic (implantable) cardiac defibrillator: Secondary | ICD-10-CM

## 2023-08-30 DIAGNOSIS — I5032 Chronic diastolic (congestive) heart failure: Secondary | ICD-10-CM

## 2023-08-30 DIAGNOSIS — I428 Other cardiomyopathies: Secondary | ICD-10-CM

## 2023-09-04 NOTE — Progress Notes (Signed)
Remote ICD transmission.   

## 2023-09-09 ENCOUNTER — Ambulatory Visit: Payer: Medicare HMO | Attending: Internal Medicine

## 2023-09-09 DIAGNOSIS — I5022 Chronic systolic (congestive) heart failure: Secondary | ICD-10-CM | POA: Diagnosis not present

## 2023-09-09 DIAGNOSIS — Z95810 Presence of automatic (implantable) cardiac defibrillator: Secondary | ICD-10-CM | POA: Diagnosis not present

## 2023-09-10 ENCOUNTER — Telehealth: Payer: Self-pay

## 2023-09-10 NOTE — Progress Notes (Signed)
EPIC Encounter for ICM Monitoring  Patient Name: Margaret Hendricks is a 67 y.o. female Date: 09/10/2023 Primary Care Physican: [name], MD Primary Cardiologist: Graciela Husbands Electrophysiologist: Joycelyn Schmid Pacing:  98.3%                 11/20/2022 Weight: 174 lbs 11/27/2022 Weight: 168 lbs 06/27/2023 Office Weight: 172 lbs   Time in AT/AF    0.0 hr/day (0.0%)         Attempted call to patient and unable to reach.  Transmission reviewed.    Optivol thoracic impedance suggesting possible fluid accumulation starting 11/9 but trending close to baseline.   Prescribed:  Furosemide 20 mg 1 tablet daily Spironolactone 25 mg take 1 tablet daily   Labs: 11/26/2022 Creatinine 0.66, BUN 12, Potassium 4.9, Sodium 139, GFR 98 02/27/2022 Creatinine 0.66, BUN 15, Potassium 4.9, Sodium 136, GFR >60 A complete set of results can be found in Results Review.   Recommendations:  Unable to reach.     Follow-up plan: ICM clinic phone appointment on 10/21/2023.   91 day device clinic remote transmission 08/19/2023.     EP/Cardiology Office Visits:   No show for 08/30/2023 with Sherie Don, NP (6 month).       Copy of ICM check sent to Dr. Graciela Husbands.    3 month ICM trend: 09/09/2023.    12-14 Month ICM trend:     Karie Soda, RN 09/10/2023 4:25 PM

## 2023-09-10 NOTE — Telephone Encounter (Signed)
Remote ICM transmission received.  Attempted call to patient regarding ICM remote transmission and no answer.  

## 2023-09-12 ENCOUNTER — Encounter: Payer: Self-pay | Admitting: Internal Medicine

## 2023-09-12 ENCOUNTER — Ambulatory Visit: Payer: Medicare HMO | Admitting: Internal Medicine

## 2023-09-12 VITALS — BP 130/78 | HR 71 | Ht 62.0 in | Wt 170.8 lb

## 2023-09-12 DIAGNOSIS — M542 Cervicalgia: Secondary | ICD-10-CM

## 2023-09-12 DIAGNOSIS — K219 Gastro-esophageal reflux disease without esophagitis: Secondary | ICD-10-CM

## 2023-09-12 DIAGNOSIS — E782 Mixed hyperlipidemia: Secondary | ICD-10-CM | POA: Diagnosis not present

## 2023-09-12 DIAGNOSIS — D696 Thrombocytopenia, unspecified: Secondary | ICD-10-CM | POA: Diagnosis not present

## 2023-09-12 DIAGNOSIS — E1159 Type 2 diabetes mellitus with other circulatory complications: Secondary | ICD-10-CM | POA: Diagnosis not present

## 2023-09-12 DIAGNOSIS — G44209 Tension-type headache, unspecified, not intractable: Secondary | ICD-10-CM

## 2023-09-12 DIAGNOSIS — E1165 Type 2 diabetes mellitus with hyperglycemia: Secondary | ICD-10-CM

## 2023-09-12 DIAGNOSIS — I5022 Chronic systolic (congestive) heart failure: Secondary | ICD-10-CM | POA: Diagnosis not present

## 2023-09-12 DIAGNOSIS — I152 Hypertension secondary to endocrine disorders: Secondary | ICD-10-CM

## 2023-09-12 LAB — GLUCOSE, POCT (MANUAL RESULT ENTRY): POC Glucose: 100 mg/dL — AB (ref 70–99)

## 2023-09-12 MED ORDER — BACLOFEN 10 MG PO TABS
10.0000 mg | ORAL_TABLET | Freq: Every day | ORAL | 1 refills | Status: AC
Start: 2023-09-12 — End: 2024-09-11

## 2023-09-12 NOTE — Progress Notes (Signed)
Established Patient Office Visit  Subjective:  Patient ID: Margaret Hendricks, female    DOB: 1955/11/15  Age: 67 y.o. MRN: [id number]  Chief Complaint  Patient presents with   Follow-up    3 month    Patient is here for her follow-up today.  She is generally feeling well but recently started right-sided headaches from the base of the neck and go up her scalp, feels some soreness in her right scalp as well.  She also noticed some watering in her right eye.  Admits to being under stress these days.  She does not have previous history of migraines or cluster headaches.  She was having problems with her left eye after surgery but those symptoms are resolving as well.  She does not complain of any eyestrain or blurry vision.  There is no nausea or vomiting, or sensory disturbance associated with these headaches.    No other concerns at this time.   Past Medical History:  Diagnosis Date   Abnormal heart rhythm    AICD (automatic cardioverter/defibrillator) present    Anemia    Cancer (HCC)    skin   Chronic systolic CHF (congestive heart failure), NYHA class 2- 3 (HCC) 07/03/2016   COPD (chronic obstructive pulmonary disease) (HCC)    Diabetes mellitus without complication (HCC)    History of colonic polyps    LBBB (left bundle branch block) 07/03/2016   NICM (nonischemic cardiomyopathy) (HCC) 07/03/2016   Shortness of breath     Past Surgical History:  Procedure Laterality Date   APPENDECTOMY     CARDIAC CATHETERIZATION Left 03/14/2015   Procedure: Left Heart Cath;  Surgeon: Laurier Nancy, MD;  Location: [facility] INVASIVE CV LAB;  Service: Cardiovascular;  Laterality: Left;   COLONOSCOPY WITH PROPOFOL N/A 05/12/2018   Procedure: COLONOSCOPY WITH PROPOFOL;  Surgeon: Christena Deem, MD;  Location: [facility] ENDOSCOPY;  Service: Endoscopy;  Laterality: N/A;   EP IMPLANTABLE DEVICE N/A 07/16/2016   Procedure: BiV ICD Insertion CRT-D;  Surgeon: Duke Salvia, MD;  Location: [facility] INVASIVE CV  LAB;  Service: Cardiovascular;  Laterality: N/A;   ESOPHAGOGASTRODUODENOSCOPY (EGD) WITH PROPOFOL N/A 05/12/2018   Procedure: ESOPHAGOGASTRODUODENOSCOPY (EGD) WITH PROPOFOL;  Surgeon: Christena Deem, MD;  Location: [facility] ENDOSCOPY;  Service: Endoscopy;  Laterality: N/A;   ICD IMPLANT     INSERT / REPLACE / REMOVE PACEMAKER     TUBAL LIGATION Bilateral     Social History   Socioeconomic History   Marital status: Single    Spouse name: Not on file   Number of children: Not on file   Years of education: Not on file   Highest education level: Not on file  Occupational History   Not on file  Tobacco Use   Smoking status: Every Day    Current packs/day: 0.10    Average packs/day: 0.1 packs/day for 30.0 years (3.0 ttl pk-yrs)    Types: Cigarettes   Smokeless tobacco: Never   Tobacco comments:    Demekia admits she smokes 1/2 cigarette in am on the way to work and 1/2 cigarette on the way home from work.   Vaping Use   Vaping status: Never Used  Substance and Sexual Activity   Alcohol use: No    Alcohol/week: 0.0 standard drinks of alcohol   Drug use: Yes    Types: Marijuana   Sexual activity: Not on file  Other Topics Concern   Not on file  Social History Narrative   Not on file  Social Determinants of Health   Financial Resource Strain: Not on file  Food Insecurity: Not on file  Transportation Needs: Not on file  Physical Activity: Not on file  Stress: Not on file  Social Connections: Not on file  Intimate Partner Violence: Not on file    Family History  Problem Relation Age of Onset   Hypertension Other    Heart attack Other    Breast cancer Neg Hx     Allergies  Allergen Reactions   Tape Other (See Comments)    Adhesive-silicones    Outpatient Medications Prior to Visit  Medication Sig   amitriptyline (ELAVIL) 10 MG tablet Take 10 mg by mouth at bedtime.   atorvastatin (LIPITOR) 80 MG tablet TAKE 1 TABLET EVERY DAY   calcium carbonate (OSCAL) 1500  (600 Ca) MG TABS tablet Take by mouth 2 (two) times daily with a meal.   carvedilol (COREG) 12.5 MG tablet TAKE 1 TABLET TWICE DAILY (NEED TO MAKE APPOINTMENT FOR MORE REFILLS)   celecoxib (CELEBREX) 200 MG capsule TAKE 1 CAPSULE ONE TIME DAILY   cholecalciferol (VITAMIN D) 1000 units tablet Take 1,000 Units by mouth daily.   Coenzyme Q10 (COQ-10) 30 MG CAPS Take by mouth.   ferrous sulfate 325 (65 FE) MG tablet Take 325 mg by mouth 2 (two) times daily with a meal.   furosemide (LASIX) 20 MG tablet TAKE 1 TABLET EVERY DAY (NEED MD APPOINTMENT)   metFORMIN (GLUCOPHAGE) 500 MG tablet TAKE 1 TABLET EVERY DAY   pantoprazole (PROTONIX) 40 MG tablet Take 1 tablet (40 mg total) by mouth daily.   pregabalin (LYRICA) 25 MG capsule Take 25 mg by mouth 2 (two) times daily. 2 in the am and 2 pm   sacubitril-valsartan (ENTRESTO) 97-103 MG Take 1 tablet by mouth 2 (two) times daily.   sertraline (ZOLOFT) 50 MG tablet TAKE 1 TABLET EVERY DAY (NEED MD APPOINTMENT)   spironolactone (ALDACTONE) 25 MG tablet Take 25 mg by mouth daily.   Multiple Vitamins-Minerals (ICAPS AREDS FORMULA PO) Take 1 tablet by mouth daily. (Patient not taking: Reported on 06/11/2023)   [DISCONTINUED] baclofen (LIORESAL) 10 MG tablet Take 1 tablet (10 mg total) by mouth daily. (Patient not taking: Reported on 06/11/2023)   [DISCONTINUED] diclofenac Sodium (VOLTAREN) 1 % GEL Apply 2 g topically 2 (two) times daily. (Patient not taking: Reported on 06/11/2023)   [DISCONTINUED] gabapentin (NEURONTIN) 100 MG capsule Take 200 mg by mouth 2 (two) times daily. (Patient not taking: Reported on 09/12/2023)   [DISCONTINUED] Menthol, Topical Analgesic, (BENGAY EX) Apply 1 application topically daily as needed. Back pain (Patient not taking: Reported on 06/11/2023)   No facility-administered medications prior to visit.    Review of Systems  Constitutional: Negative.  Negative for chills and fever.  HENT: Negative.  Negative for congestion, hearing  loss, sinus pain and sore throat.   Eyes: Negative.  Negative for blurred vision, double vision and photophobia.  Respiratory: Negative.  Negative for cough and shortness of breath.   Cardiovascular: Negative.  Negative for chest pain, palpitations and leg swelling.  Gastrointestinal: Negative.  Negative for abdominal pain, constipation, diarrhea, heartburn, nausea and vomiting.  Genitourinary: Negative.  Negative for dysuria and flank pain.  Musculoskeletal: Negative.  Negative for joint pain and myalgias.  Skin: Negative.   Neurological:  Positive for headaches. Negative for dizziness, tingling, tremors, sensory change and speech change.  Endo/Heme/Allergies: Negative.   Psychiatric/Behavioral: Negative.  Negative for depression, memory loss and suicidal ideas. The patient is  not nervous/anxious and does not have insomnia.        Objective:   BP 130/78   Pulse 71   Ht 5\' 2"  (1.575 m)   Wt 170 lb 12.8 oz (77.5 kg)   SpO2 95%   BMI 31.24 kg/m   Vitals:   09/12/23 1055  BP: 130/78  Pulse: 71  Height: 5\' 2"  (1.575 m)  Weight: 170 lb 12.8 oz (77.5 kg)  SpO2: 95%  BMI (Calculated): 31.23    Physical Exam Vitals and nursing note reviewed.  Constitutional:      Appearance: Normal appearance.  HENT:     Head: Normocephalic and atraumatic.     Nose: Nose normal.     Mouth/Throat:     Mouth: Mucous membranes are moist.     Pharynx: Oropharynx is clear.  Eyes:     Conjunctiva/sclera: Conjunctivae normal.     Pupils: Pupils are equal, round, and reactive to light.  Cardiovascular:     Rate and Rhythm: Normal rate and regular rhythm.     Pulses: Normal pulses.     Heart sounds: Normal heart sounds. No murmur heard. Pulmonary:     Effort: Pulmonary effort is normal.     Breath sounds: Normal breath sounds. No wheezing.  Abdominal:     General: Bowel sounds are normal.     Palpations: Abdomen is soft.     Tenderness: There is no abdominal tenderness. There is no right CVA  tenderness or left CVA tenderness.  Musculoskeletal:        General: Normal range of motion.     Cervical back: Normal range of motion.     Right lower leg: No edema.     Left lower leg: No edema.  Skin:    General: Skin is warm and dry.  Neurological:     General: No focal deficit present.     Mental Status: She is alert and oriented to person, place, and time.  Psychiatric:        Mood and Affect: Mood normal.        Behavior: Behavior normal.      Results for orders placed or performed in visit on 09/12/23  POCT Glucose (CBG)  Result Value Ref Range   POC Glucose 100 (A) 70 - 99 mg/dl    Recent Results (from the past 2160 hour(s))  Hemoglobin A1c     Status: Abnormal   Collection Time: 06/18/23  9:17 AM  Result Value Ref Range   Hgb A1c MFr Bld 6.4 (H) 4.8 - 5.6 %    Comment:          Prediabetes: 5.7 - 6.4          Diabetes: >6.4          Glycemic control for adults with diabetes: <7.0    Est. average glucose Bld gHb Est-mCnc 137 mg/dL  Lipid Panel w/o Chol/HDL Ratio     Status: None   Collection Time: 06/18/23  9:17 AM  Result Value Ref Range   Cholesterol, Total 112 100 - 199 mg/dL   Triglycerides 366 0 - 149 mg/dL   HDL 49 >44 mg/dL   VLDL Cholesterol Cal 25 5 - 40 mg/dL   LDL Chol Calc (NIH) 38 0 - 99 mg/dL  [id number]     Status: Abnormal   Collection Time: 06/18/23  9:17 AM  Result Value Ref Range   Glucose 111 (H) 70 - 99 mg/dL   BUN 18 8 - 27  mg/dL   Creatinine, Ser 2.44 0.57 - 1.00 mg/dL   eGFR 87 >01 UU/VOZ/3.66   BUN/Creatinine Ratio 24 12 - 28   Sodium 141 134 - 144 mmol/L   Potassium 4.4 3.5 - 5.2 mmol/L   Chloride 102 96 - 106 mmol/L   CO2 27 20 - 29 mmol/L   Calcium 9.3 8.7 - 10.3 mg/dL   Total Protein 6.8 6.0 - 8.5 g/dL   Albumin 4.3 3.9 - 4.9 g/dL   Globulin, Total 2.5 1.5 - 4.5 g/dL   Bilirubin Total 0.2 0.0 - 1.2 mg/dL   Alkaline Phosphatase 65 44 - 121 IU/L   AST 18 0 - 40 IU/L   ALT 15 0 - 32 IU/L  CBC with Diff     Status:  Abnormal   Collection Time: 06/18/23  9:17 AM  Result Value Ref Range   WBC 5.9 3.4 - 10.8 x10E3/uL   RBC 3.63 (L) 3.77 - 5.28 x10E6/uL   Hemoglobin 11.1 11.1 - 15.9 g/dL   Hematocrit 44.0 34.7 - 46.6 %   MCV 95 79 - 97 fL   MCH 30.6 26.6 - 33.0 pg   MCHC 32.1 31.5 - 35.7 g/dL   RDW 42.5 95.6 - 38.7 %   Platelets 149 (L) 150 - 450 x10E3/uL   Neutrophils 48 Not Estab. %   Lymphs 43 Not Estab. %   Monocytes 6 Not Estab. %   Eos 2 Not Estab. %   Basos 1 Not Estab. %   Neutrophils Absolute 2.9 1.4 - 7.0 x10E3/uL   Lymphocytes Absolute 2.6 0.7 - 3.1 x10E3/uL   Monocytes Absolute 0.3 0.1 - 0.9 x10E3/uL   EOS (ABSOLUTE) 0.1 0.0 - 0.4 x10E3/uL   Basophils Absolute 0.0 0.0 - 0.2 x10E3/uL   Immature Granulocytes 0 Not Estab. %   Immature Grans (Abs) 0.0 0.0 - 0.1 x10E3/uL  CBC with Diff     Status: Abnormal   Collection Time: 06/27/23 11:13 AM  Result Value Ref Range   WBC 5.9 3.4 - 10.8 x10E3/uL   RBC 3.41 (L) 3.77 - 5.28 x10E6/uL   Hemoglobin 10.4 (L) 11.1 - 15.9 g/dL   Hematocrit 56.4 (L) 33.2 - 46.6 %   MCV 94 79 - 97 fL   MCH 30.5 26.6 - 33.0 pg   MCHC 32.6 31.5 - 35.7 g/dL   RDW 95.1 88.4 - 16.6 %   Platelets 147 (L) 150 - 450 x10E3/uL   Neutrophils 35 Not Estab. %   Lymphs 57 Not Estab. %   Monocytes 5 Not Estab. %   Eos 2 Not Estab. %   Basos 1 Not Estab. %   Neutrophils Absolute 2.1 1.4 - 7.0 x10E3/uL   Lymphocytes Absolute 3.3 (H) 0.7 - 3.1 x10E3/uL   Monocytes Absolute 0.3 0.1 - 0.9 x10E3/uL   EOS (ABSOLUTE) 0.1 0.0 - 0.4 x10E3/uL   Basophils Absolute 0.0 0.0 - 0.2 x10E3/uL   Immature Granulocytes 0 Not Estab. %   Immature Grans (Abs) 0.0 0.0 - 0.1 x10E3/uL  CBC with Diff     Status: Abnormal   Collection Time: 07/29/23  9:23 AM  Result Value Ref Range   WBC 6.9 3.4 - 10.8 x10E3/uL   RBC 3.61 (L) 3.77 - 5.28 x10E6/uL   Hemoglobin 11.2 11.1 - 15.9 g/dL   Hematocrit 06.3 01.6 - 46.6 %   MCV 97 79 - 97 fL   MCH 31.0 26.6 - 33.0 pg   MCHC 31.9 31.5 - 35.7 g/dL  RDW 13.4 11.7 - 15.4 %   Platelets 171 150 - 450 x10E3/uL   Neutrophils 45 Not Estab. %   Lymphs 48 Not Estab. %   Monocytes 4 Not Estab. %   Eos 2 Not Estab. %   Basos 1 Not Estab. %   Neutrophils Absolute 3.1 1.4 - 7.0 x10E3/uL   Lymphocytes Absolute 3.4 (H) 0.7 - 3.1 x10E3/uL   Monocytes Absolute 0.3 0.1 - 0.9 x10E3/uL   EOS (ABSOLUTE) 0.1 0.0 - 0.4 x10E3/uL   Basophils Absolute 0.0 0.0 - 0.2 x10E3/uL   Immature Granulocytes 0 Not Estab. %   Immature Grans (Abs) 0.0 0.0 - 0.1 x10E3/uL  CUP PACEART REMOTE DEVICE CHECK     Status: None   Collection Time: 08/19/23  2:28 AM  Result Value Ref Range   Date Time Interrogation Session [id number]    Pulse Generator Manufacturer MERM    Pulse Gen Model [id number] Claria MRI Quad CRT-D    Pulse Gen Serial Number [id number]    Clinic Name [facility]    Implantable Pulse Generator Type Cardiac Resynch Therapy Defibulator    Implantable Pulse Generator Implant Date [id number]    Implantable Lead Manufacturer MERM    Implantable Lead Model 5076 CapSureFix Novus MRI SureScan    Implantable Lead Serial Number [id number]    Implantable Lead Implant Date 53664403    Implantable Lead Location Detail 1 APPENDAGE    Implantable Lead Location [id number]    Implantable Lead Connection Status [id number]    Implantable Lead Manufacturer [facility]    Implantable Lead Model 815-279-8023 Sprint Quattro Secure S MRI SureScan    Implantable Lead Serial Number [id number]    Implantable Lead Implant Date 95638756    Implantable Lead Location Detail 1 APEX    Implantable Lead Location [id number]    Implantable Lead Connection Status [id number]    Implantable Lead Manufacturer [facility]    Implantable Lead Model 1458Q Quartet    Implantable Lead Serial Number [id number]    Implantable Lead Implant Date [id number]    Implantable Lead Location Detail 1 UNKNOWN    Implantable Lead Location [id number]    Implantable Lead Connection Status [id number]    Lead Channel Setting Sensing Sensitivity 0.3  mV   Lead Channel Setting Pacing Amplitude 2 V   Lead Channel Setting Pacing Pulse Width 0.4 ms   Lead Channel Setting Pacing Amplitude 2.5 V   Lead Channel Setting Pacing Pulse Width 0.8 ms   Lead Channel Setting Pacing Amplitude 3.25 V   Lead Channel Setting Pacing Capture Mode Adaptive Capture    Zone Setting Status Active    Zone Setting Status Inactive    Zone Setting Status Inactive    Zone Setting Status Inactive    Zone Setting Status [phone]    Lead Channel Impedance Value 437 ohm   Lead Channel Sensing Intrinsic Amplitude 2.875 mV   Lead Channel Sensing Intrinsic Amplitude 2.875 mV   Lead Channel Pacing Threshold Amplitude 0.5 V   Lead Channel Pacing Threshold Pulse Width 0.4 ms   Lead Channel Impedance Value 494 ohm   Lead Channel Impedance Value 399 ohm   Lead Channel Sensing Intrinsic Amplitude 8.25 mV   Lead Channel Sensing Intrinsic Amplitude 8.25 mV   Lead Channel Pacing Threshold Amplitude 0.625 V   Lead Channel Pacing Threshold Pulse Width 0.4 ms   HighPow Impedance 71 ohm   Lead Channel Impedance Value 1,045 ohm   Lead Channel Impedance Value 1,102 ohm   Lead Channel Impedance  Value 1,064 ohm   Lead Channel Impedance Value 988 ohm   Lead Channel Impedance Value 988 ohm   Lead Channel Impedance Value 988 ohm   Lead Channel Impedance Value 608 ohm   Lead Channel Impedance Value 551 ohm   Lead Channel Impedance Value 646 ohm   Lead Channel Impedance Value 513 ohm   Lead Channel Impedance Value 289.049    Lead Channel Impedance Value 313.212    Lead Channel Impedance Value 278.237    Lead Channel Impedance Value 265.661    Lead Channel Impedance Value 285.934    Lead Channel Pacing Threshold Amplitude 2.75 V   Lead Channel Pacing Threshold Pulse Width 0.8 ms   Battery Status OK    Battery Remaining Longevity 20 mo   Battery Voltage 2.91 V   Brady Statistic RA Percent Paced 1.21 %   Brady Statistic RV Percent Paced 2.89 %   Brady Statistic AP VP Percent 1.17  %   Brady Statistic AS VP Percent 97.3 %   Brady Statistic AP VS Percent 0.05 %   Brady Statistic AS VS Percent 1.49 %  POCT Glucose (CBG)     Status: Abnormal   Collection Time: 09/12/23 11:03 AM  Result Value Ref Range   POC Glucose 100 (A) 70 - 99 mg/dl      Assessment & Plan:  Patient advised to take her baclofen at bedtime along with her Celebrex.  To come in sooner if the headaches get worse. Continue all her medications. Problem List Items Addressed This Visit     Chronic systolic CHF (congestive heart failure), NYHA class 2- 3 (HCC)   Gastroesophageal reflux disease without esophagitis   Type 2 diabetes mellitus with hyperglycemia, without long-term current use of insulin (HCC)   Relevant Orders   POCT Glucose (CBG) (Completed)   Mixed hyperlipidemia   Hypertension associated with diabetes (HCC) - Primary   Other Visit Diagnoses     Neck pain       Relevant Medications   baclofen (LIORESAL) 10 MG tablet   Thrombocytopenia (HCC)       Tension headache       Relevant Medications   pregabalin (LYRICA) 25 MG capsule   baclofen (LIORESAL) 10 MG tablet       Return in about 2 weeks (around 09/26/2023).   Total time spent: 30 minutes  [name], MD  09/12/2023   This document may have been prepared by [facility] Voice Recognition software and as such may include unintentional dictation errors.

## 2023-09-15 ENCOUNTER — Other Ambulatory Visit: Payer: Self-pay | Admitting: Internal Medicine

## 2023-09-19 DIAGNOSIS — H18513 Endothelial corneal dystrophy, bilateral: Secondary | ICD-10-CM | POA: Diagnosis not present

## 2023-09-19 DIAGNOSIS — T868409 Corneal transplant rejection, unspecified eye: Secondary | ICD-10-CM | POA: Diagnosis not present

## 2023-09-24 DIAGNOSIS — T868409 Corneal transplant rejection, unspecified eye: Secondary | ICD-10-CM | POA: Diagnosis not present

## 2023-09-25 ENCOUNTER — Other Ambulatory Visit: Payer: Self-pay | Admitting: Internal Medicine

## 2023-09-25 DIAGNOSIS — E119 Type 2 diabetes mellitus without complications: Secondary | ICD-10-CM

## 2023-09-30 ENCOUNTER — Encounter: Payer: Self-pay | Admitting: Internal Medicine

## 2023-09-30 ENCOUNTER — Ambulatory Visit (INDEPENDENT_AMBULATORY_CARE_PROVIDER_SITE_OTHER): Payer: Medicare HMO | Admitting: Internal Medicine

## 2023-09-30 VITALS — BP 102/60 | HR 61 | Ht 62.0 in | Wt 174.8 lb

## 2023-09-30 DIAGNOSIS — E1159 Type 2 diabetes mellitus with other circulatory complications: Secondary | ICD-10-CM

## 2023-09-30 DIAGNOSIS — E1165 Type 2 diabetes mellitus with hyperglycemia: Secondary | ICD-10-CM

## 2023-09-30 DIAGNOSIS — E1169 Type 2 diabetes mellitus with other specified complication: Secondary | ICD-10-CM | POA: Insufficient documentation

## 2023-09-30 DIAGNOSIS — E782 Mixed hyperlipidemia: Secondary | ICD-10-CM

## 2023-09-30 DIAGNOSIS — I152 Hypertension secondary to endocrine disorders: Secondary | ICD-10-CM | POA: Diagnosis not present

## 2023-09-30 DIAGNOSIS — Z23 Encounter for immunization: Secondary | ICD-10-CM | POA: Diagnosis not present

## 2023-09-30 DIAGNOSIS — Z0000 Encounter for general adult medical examination without abnormal findings: Secondary | ICD-10-CM

## 2023-09-30 LAB — GLUCOSE, POCT (MANUAL RESULT ENTRY): POC Glucose: 118 mg/dL — AB (ref 70–99)

## 2023-09-30 NOTE — Progress Notes (Signed)
Established Patient Office Visit  Subjective:  Patient ID: Margaret Hendricks, female    DOB: [birth year]  Age: 67 y.o. MRN: [id number]  Chief Complaint  Patient presents with   Annual Exam    AWV    Patient comes in for a follow-up and annual wellness visit.  Her right sided headaches have resolved completely since she started taking Celebrex and baclofen as needed.  She is up-to-date on her mammogram, DEXA scan, colonoscopy. She gets regular eye exams. Urine microalbumin was done today. Her PHQ-9/GAD score is 4/9-she is currently on Zoloft and thinks it is working well, does not want to increase the dose or change the medicine. Her CFS score is 0.    No other concerns at this time.   Past Medical History:  Diagnosis Date   Abnormal heart rhythm    AICD (automatic cardioverter/defibrillator) present    Anemia    Cancer (HCC)    skin   Chronic systolic CHF (congestive heart failure), NYHA class 2- 3 (HCC) 07/03/2016   COPD (chronic obstructive pulmonary disease) (HCC)    Diabetes mellitus without complication (HCC)    History of colonic polyps    LBBB (left bundle branch block) 07/03/2016   NICM (nonischemic cardiomyopathy) (HCC) 07/03/2016   Shortness of breath     Past Surgical History:  Procedure Laterality Date   APPENDECTOMY     CARDIAC CATHETERIZATION Left 03/14/2015   Procedure: Left Heart Cath;  Surgeon: Laurier Nancy, MD;  Location: [facility] INVASIVE CV LAB;  Service: Cardiovascular;  Laterality: Left;   COLONOSCOPY WITH PROPOFOL N/A 05/12/2018   Procedure: COLONOSCOPY WITH PROPOFOL;  Surgeon: Christena Deem, MD;  Location: [facility] ENDOSCOPY;  Service: Endoscopy;  Laterality: N/A;   EP IMPLANTABLE DEVICE N/A 07/16/2016   Procedure: BiV ICD Insertion CRT-D;  Surgeon: Duke Salvia, MD;  Location: [facility] INVASIVE CV LAB;  Service: Cardiovascular;  Laterality: N/A;   ESOPHAGOGASTRODUODENOSCOPY (EGD) WITH PROPOFOL N/A 05/12/2018   Procedure: ESOPHAGOGASTRODUODENOSCOPY (EGD)  WITH PROPOFOL;  Surgeon: Christena Deem, MD;  Location: [facility] ENDOSCOPY;  Service: Endoscopy;  Laterality: N/A;   ICD IMPLANT     INSERT / REPLACE / REMOVE PACEMAKER     TUBAL LIGATION Bilateral     Social History   Socioeconomic History   Marital status: Single    Spouse name: Not on file   Number of children: Not on file   Years of education: Not on file   Highest education level: Not on file  Occupational History   Not on file  Tobacco Use   Smoking status: Former    Current packs/day: 0.00    Average packs/day: 0.1 packs/day for 30.0 years (3.0 ttl pk-yrs)    Types: Cigarettes    Quit date: 2021    Years since quitting: 3.9   Smokeless tobacco: Never   Tobacco comments:    [name] admits she smokes 1/2 cigarette in am on the way to work and 1/2 cigarette on the way home from work.   Vaping Use   Vaping status: Never Used  Substance and Sexual Activity   Alcohol use: No    Alcohol/week: 0.0 standard drinks of alcohol   Drug use: Never    Types: Marijuana   Sexual activity: Not on file  Other Topics Concern   Not on file  Social History Narrative   Not on file   Social Determinants of Health   Financial Resource Strain: Low Risk  (09/30/2023)   Overall Financial Resource Strain (  CARDIA)    Difficulty of Paying Living Expenses: Not hard at all  Food Insecurity: No Food Insecurity (09/30/2023)   Hunger Vital Sign    Worried About Running Out of Food in the Last Year: Never true    Ran Out of Food in the Last Year: Never true  Transportation Needs: No Transportation Needs (09/30/2023)   PRAPARE - Administrator, Civil Service (Medical): No    Lack of Transportation (Non-Medical): No  Physical Activity: Not on file  Stress: No Stress Concern Present (09/30/2023)   [employer] of Occupational Health - Occupational Stress Questionnaire    Feeling of Stress : Only a little  Social Connections: Not on file  Intimate Partner Violence: Not on file     Family History  Problem Relation Age of Onset   Hypertension Other    Heart attack Other    Breast cancer Neg Hx     Allergies  Allergen Reactions   Tape Other (See Comments)    Adhesive-silicones    Outpatient Medications Prior to Visit  Medication Sig   amitriptyline (ELAVIL) 10 MG tablet TAKE 1 TABLET AT BEDTIME   atorvastatin (LIPITOR) 80 MG tablet TAKE 1 TABLET EVERY DAY   baclofen (LIORESAL) 10 MG tablet Take 1 tablet (10 mg total) by mouth daily.   calcium carbonate (OSCAL) 1500 (600 Ca) MG TABS tablet Take by mouth 2 (two) times daily with a meal.   carvedilol (COREG) 12.5 MG tablet TAKE 1 TABLET TWICE DAILY (NEED TO MAKE APPOINTMENT FOR MORE REFILLS)   celecoxib (CELEBREX) 200 MG capsule TAKE 1 CAPSULE ONE TIME DAILY   cholecalciferol (VITAMIN D) 1000 units tablet Take 1,000 Units by mouth daily.   Coenzyme Q10 (COQ-10) 30 MG CAPS Take by mouth.   ferrous sulfate 325 (65 FE) MG tablet Take 325 mg by mouth 2 (two) times daily with a meal.   furosemide (LASIX) 20 MG tablet TAKE 1 TABLET EVERY DAY (NEED MD APPOINTMENT)   metFORMIN (GLUCOPHAGE) 500 MG tablet TAKE 1 TABLET EVERY DAY   pantoprazole (PROTONIX) 40 MG tablet Take 1 tablet (40 mg total) by mouth daily.   prednisoLONE acetate (PRED FORTE) 1 % ophthalmic suspension Place 1 drop into the left eye every 2 (two) hours.   pregabalin (LYRICA) 25 MG capsule Take 25 mg by mouth 2 (two) times daily. 2 in the am and 2 pm   sacubitril-valsartan (ENTRESTO) 97-103 MG Take 1 tablet by mouth 2 (two) times daily.   sertraline (ZOLOFT) 50 MG tablet TAKE 1 TABLET EVERY DAY (NEED MD APPOINTMENT)   spironolactone (ALDACTONE) 25 MG tablet Take 25 mg by mouth daily.   Multiple Vitamins-Minerals (ICAPS AREDS FORMULA PO) Take 1 tablet by mouth daily. (Patient not taking: Reported on 06/11/2023)   No facility-administered medications prior to visit.    Review of Systems  Constitutional: Negative.  Negative for chills, diaphoresis,  fever, malaise/fatigue and weight loss.  HENT: Negative.  Negative for sinus pain, sore throat and tinnitus.   Eyes:  Positive for blurred vision and redness. Negative for double vision, photophobia and pain.  Respiratory: Negative.  Negative for cough, shortness of breath, wheezing and stridor.   Cardiovascular: Negative.  Negative for chest pain, palpitations and leg swelling.  Gastrointestinal: Negative.  Negative for abdominal pain, constipation, diarrhea, heartburn, nausea and vomiting.  Genitourinary: Negative.  Negative for dysuria and flank pain.  Musculoskeletal: Negative.  Negative for joint pain and myalgias.  Skin: Negative.   Neurological: Negative.  Negative for dizziness, tingling, tremors, sensory change, speech change, focal weakness and headaches.  Endo/Heme/Allergies: Negative.   Psychiatric/Behavioral: Negative.  Negative for depression and suicidal ideas. The patient is not nervous/anxious.        Objective:   BP 102/60   Pulse 61   Wt 174 lb 12.8 oz (79.3 kg)   SpO2 96%   BMI 31.97 kg/m   Vitals:   09/30/23 1346  BP: 102/60  Pulse: 61  Weight: 174 lb 12.8 oz (79.3 kg)  SpO2: 96%  BMI (Calculated): 31.96    Physical Exam Vitals and nursing note reviewed.  Constitutional:      General: She is not in acute distress.    Appearance: Normal appearance.  HENT:     Head: Normocephalic and atraumatic.     Right Ear: Ear canal normal.     Left Ear: Ear canal normal.     Nose: Nose normal.     Mouth/Throat:     Mouth: Mucous membranes are moist.     Pharynx: Oropharynx is clear.  Eyes:     Conjunctiva/sclera: Conjunctivae normal.     Pupils: Pupils are equal, round, and reactive to light.  Cardiovascular:     Rate and Rhythm: Normal rate and regular rhythm.     Pulses: Normal pulses.     Heart sounds: Normal heart sounds. No murmur heard. Pulmonary:     Effort: Pulmonary effort is normal.     Breath sounds: Normal breath sounds. No wheezing.   Abdominal:     General: Bowel sounds are normal.     Palpations: Abdomen is soft.     Tenderness: There is no abdominal tenderness. There is no right CVA tenderness or left CVA tenderness.  Musculoskeletal:        General: Normal range of motion.     Cervical back: Normal range of motion and neck supple. No rigidity.     Right lower leg: No edema.     Left lower leg: No edema.  Lymphadenopathy:     Cervical: No cervical adenopathy.  Skin:    General: Skin is warm and dry.     Findings: No bruising, lesion or rash.  Neurological:     General: No focal deficit present.     Mental Status: She is alert and oriented to person, place, and time.  Psychiatric:        Mood and Affect: Mood normal.        Behavior: Behavior normal.      Results for orders placed or performed in visit on 09/30/23  POCT Glucose (CBG)  Result Value Ref Range   POC Glucose 118 (A) 70 - 99 mg/dl    Recent Results (from the past 2160 hour(s))  CBC with Diff     Status: Abnormal   Collection Time: 07/29/23  9:23 AM  Result Value Ref Range   WBC 6.9 3.4 - 10.8 x10E3/uL   RBC 3.61 (L) 3.77 - 5.28 x10E6/uL   Hemoglobin 11.2 11.1 - 15.9 g/dL   Hematocrit 96.2 95.2 - 46.6 %   MCV 97 79 - 97 fL   MCH 31.0 26.6 - 33.0 pg   MCHC 31.9 31.5 - 35.7 g/dL   RDW 84.1 32.4 - 40.1 %   Platelets 171 150 - 450 x10E3/uL   Neutrophils 45 Not Estab. %   Lymphs 48 Not Estab. %   Monocytes 4 Not Estab. %   Eos 2 Not Estab. %   Basos 1 Not Estab. %  Neutrophils Absolute 3.1 1.4 - 7.0 x10E3/uL   Lymphocytes Absolute 3.4 (H) 0.7 - 3.1 x10E3/uL   Monocytes Absolute 0.3 0.1 - 0.9 x10E3/uL   EOS (ABSOLUTE) 0.1 0.0 - 0.4 x10E3/uL   Basophils Absolute 0.0 0.0 - 0.2 x10E3/uL   Immature Granulocytes 0 Not Estab. %   Immature Grans (Abs) 0.0 0.0 - 0.1 x10E3/uL  CUP PACEART REMOTE DEVICE CHECK     Status: None   Collection Time: 08/19/23  2:28 AM  Result Value Ref Range   Date Time Interrogation Session [id number]     Pulse Generator Manufacturer MERM    Pulse Gen Model [id number] Claria MRI Quad CRT-D    Pulse Gen Serial Number [id number]    Clinic Name [facility]    Implantable Pulse Generator Type Cardiac Resynch Therapy Defibulator    Implantable Pulse Generator Implant Date [id number]    Implantable Lead Manufacturer MERM    Implantable Lead Model 5076 CapSureFix Novus MRI SureScan    Implantable Lead Serial Number [id number]    Implantable Lead Implant Date 41324401    Implantable Lead Location Detail 1 APPENDAGE    Implantable Lead Location [id number]    Implantable Lead Connection Status [id number]    Implantable Lead Manufacturer [facility]    Implantable Lead Model 530 334 2699 Sprint Quattro Secure S MRI SureScan    Implantable Lead Serial Number [id number]    Implantable Lead Implant Date 36644034    Implantable Lead Location Detail 1 APEX    Implantable Lead Location [id number]    Implantable Lead Connection Status [id number]    Implantable Lead Manufacturer [facility]    Implantable Lead Model 1458Q Quartet    Implantable Lead Serial Number [id number]    Implantable Lead Implant Date [id number]    Implantable Lead Location Detail 1 UNKNOWN    Implantable Lead Location [id number]    Implantable Lead Connection Status [id number]    Lead Channel Setting Sensing Sensitivity 0.3 mV   Lead Channel Setting Pacing Amplitude 2 V   Lead Channel Setting Pacing Pulse Width 0.4 ms   Lead Channel Setting Pacing Amplitude 2.5 V   Lead Channel Setting Pacing Pulse Width 0.8 ms   Lead Channel Setting Pacing Amplitude 3.25 V   Lead Channel Setting Pacing Capture Mode Adaptive Capture    Zone Setting Status Active    Zone Setting Status Inactive    Zone Setting Status Inactive    Zone Setting Status Inactive    Zone Setting Status [phone]    Lead Channel Impedance Value 437 ohm   Lead Channel Sensing Intrinsic Amplitude 2.875 mV   Lead Channel Sensing Intrinsic Amplitude 2.875 mV   Lead Channel Pacing Threshold Amplitude 0.5 V   Lead  Channel Pacing Threshold Pulse Width 0.4 ms   Lead Channel Impedance Value 494 ohm   Lead Channel Impedance Value 399 ohm   Lead Channel Sensing Intrinsic Amplitude 8.25 mV   Lead Channel Sensing Intrinsic Amplitude 8.25 mV   Lead Channel Pacing Threshold Amplitude 0.625 V   Lead Channel Pacing Threshold Pulse Width 0.4 ms   HighPow Impedance 71 ohm   Lead Channel Impedance Value 1,045 ohm   Lead Channel Impedance Value 1,102 ohm   Lead Channel Impedance Value 1,064 ohm   Lead Channel Impedance Value 988 ohm   Lead Channel Impedance Value 988 ohm   Lead Channel Impedance Value 988 ohm   Lead Channel Impedance Value 608 ohm   Lead Channel Impedance Value 551 ohm   Lead Channel Impedance Value 646  ohm   Lead Channel Impedance Value 513 ohm   Lead Channel Impedance Value 289.049    Lead Channel Impedance Value 313.212    Lead Channel Impedance Value 278.237    Lead Channel Impedance Value 265.661    Lead Channel Impedance Value 285.934    Lead Channel Pacing Threshold Amplitude 2.75 V   Lead Channel Pacing Threshold Pulse Width 0.8 ms   Battery Status OK    Battery Remaining Longevity 20 mo   Battery Voltage 2.91 V   Brady Statistic RA Percent Paced 1.21 %   Brady Statistic RV Percent Paced 2.89 %   Brady Statistic AP VP Percent 1.17 %   Brady Statistic AS VP Percent 97.3 %   Brady Statistic AP VS Percent 0.05 %   Brady Statistic AS VS Percent 1.49 %  POCT Glucose (CBG)     Status: Abnormal   Collection Time: 09/12/23 11:03 AM  Result Value Ref Range   POC Glucose 100 (A) 70 - 99 mg/dl  POCT Glucose (CBG)     Status: Abnormal   Collection Time: 09/30/23  1:52 PM  Result Value Ref Range   POC Glucose 118 (A) 70 - 99 mg/dl      Assessment & Plan:  Continue medications. Flu vaccine today. Check urine microalbumin. Patient will return fasting for lab work. Problem List Items Addressed This Visit     Type 2 diabetes mellitus with hyperglycemia, without long-term current  use of insulin (HCC)   Relevant Orders   POCT Glucose (CBG) (Completed)   Hemoglobin A1c   Microalbumin / Creatinine Urine Ratio   Hypertension associated with diabetes (HCC)   Relevant Orders   CMP14+EGFR   Combined hyperlipidemia associated with type 2 diabetes mellitus (HCC)   Relevant Orders   Lipid Panel w/o Chol/HDL Ratio   Other Visit Diagnoses     Annual wellness visit    -  Primary   Need for immunization against influenza       Relevant Orders   Influenza, MDCK, trivalent, PF(Flucelvax egg-free) (Completed)       Return in about 3 months (around 12/29/2023).   Total time spent: 30 minutes  [name], MD  09/30/2023   This document may have been prepared by [facility] Voice Recognition software and as such may include unintentional dictation errors.

## 2023-10-02 LAB — MICROALBUMIN / CREATININE URINE RATIO
Creatinine, Urine: 31.8 mg/dL
Microalb/Creat Ratio: <9 mg/g{creat} (ref 0–29)
Microalbumin, Urine: <3 ug/mL

## 2023-10-03 ENCOUNTER — Ambulatory Visit: Payer: Medicare HMO | Admitting: Internal Medicine

## 2023-10-03 ENCOUNTER — Other Ambulatory Visit (INDEPENDENT_AMBULATORY_CARE_PROVIDER_SITE_OTHER): Payer: Medicare HMO

## 2023-10-03 DIAGNOSIS — E1165 Type 2 diabetes mellitus with hyperglycemia: Secondary | ICD-10-CM | POA: Diagnosis not present

## 2023-10-03 DIAGNOSIS — E782 Mixed hyperlipidemia: Secondary | ICD-10-CM | POA: Diagnosis not present

## 2023-10-03 DIAGNOSIS — E1159 Type 2 diabetes mellitus with other circulatory complications: Secondary | ICD-10-CM | POA: Diagnosis not present

## 2023-10-03 DIAGNOSIS — I152 Hypertension secondary to endocrine disorders: Secondary | ICD-10-CM | POA: Diagnosis not present

## 2023-10-03 DIAGNOSIS — E1169 Type 2 diabetes mellitus with other specified complication: Secondary | ICD-10-CM | POA: Diagnosis not present

## 2023-10-04 LAB — CMP14+EGFR
ALT: 10 [IU]/L (ref 0–32)
AST: 17 [IU]/L (ref 0–40)
Albumin: 4.3 g/dL (ref 3.9–4.9)
Alkaline Phosphatase: 69 [IU]/L (ref 44–121)
BUN/Creatinine Ratio: 22 (ref 12–28)
BUN: 19 mg/dL (ref 8–27)
Bilirubin Total: 0.5 mg/dL (ref 0.0–1.2)
CO2: 25 mmol/L (ref 20–29)
Calcium: 9.2 mg/dL (ref 8.7–10.3)
Chloride: 99 mmol/L (ref 96–106)
Creatinine, Ser: 0.86 mg/dL (ref 0.57–1.00)
Globulin, Total: 2.5 g/dL (ref 1.5–4.5)
Glucose: 94 mg/dL (ref 70–99)
Potassium: 4.8 mmol/L (ref 3.5–5.2)
Sodium: 139 mmol/L (ref 134–144)
Total Protein: 6.8 g/dL (ref 6.0–8.5)
eGFR: 75 mL/min/{1.73_m2} (ref >59)

## 2023-10-04 LAB — LIPID PANEL W/O CHOL/HDL RATIO
Cholesterol, Total: 119 mg/dL (ref 100–199)
HDL: 55 mg/dL (ref >39)
LDL Chol Calc (NIH): 46 mg/dL (ref 0–99)
Triglycerides: 96 mg/dL (ref 0–149)
VLDL Cholesterol Cal: 18 mg/dL (ref 5–40)

## 2023-10-04 LAB — HEMOGLOBIN A1C
Est. average glucose Bld gHb Est-mCnc: 137 mg/dL
Hgb A1c MFr Bld: 6.4 % — ABNORMAL HIGH (ref 4.8–5.6)

## 2023-10-10 DIAGNOSIS — T868409 Corneal transplant rejection, unspecified eye: Secondary | ICD-10-CM | POA: Diagnosis not present

## 2023-10-21 ENCOUNTER — Ambulatory Visit: Payer: Medicare HMO | Attending: Internal Medicine

## 2023-10-21 DIAGNOSIS — Z95810 Presence of automatic (implantable) cardiac defibrillator: Secondary | ICD-10-CM

## 2023-10-21 DIAGNOSIS — I5022 Chronic systolic (congestive) heart failure: Secondary | ICD-10-CM

## 2023-10-24 ENCOUNTER — Telehealth: Payer: Self-pay

## 2023-10-24 NOTE — Telephone Encounter (Signed)
Remote ICM transmission received.  Attempted call to patient regarding ICM remote transmission and left detailed message per DPR.  Left ICM phone number and advised to return call for any fluid symptoms or questions. Next ICM remote transmission scheduled 11/25/2023.

## 2023-10-24 NOTE — Progress Notes (Signed)
EPIC Encounter for ICM Monitoring  Patient Name: Margaret Hendricks is a 67 y.o. female Date: 10/24/2023 Primary Care Physican: [name], MD Primary Cardiologist: Graciela Husbands Electrophysiologist: Joycelyn Schmid Pacing:  98.3%                 11/20/2022 Weight: 174 lbs 11/27/2022 Weight: 168 lbs 06/27/2023 Office Weight: 172 lbs   Time in AT/AF    0.0 hr/day (0.0%)         Attempted call to patient and unable to reach.  Left detailed message per DPR regarding transmission.  Transmission results reviewed.     Optivol thoracic impedance suggesting normal fluid levels within the last month.   Prescribed:  Furosemide 20 mg 1 tablet daily Spironolactone 25 mg take 1 tablet daily   Labs: 10/03/2023 Creatinine 0.86, BUN 19, Potassium 4.8, Sodium 139, GFR 75  06/18/2023 Creatinine 0.76, BUN 18, Potassium 4.4, Sodium 141, GFR 87  02/25/2023 Creatinine 0.84, BUN 12, Potassium 4.8, Sodium 138, GFR 78  A complete set of results can be found in Results Review.   Recommendations:  Left voice mail with ICM number and encouraged to call if experiencing any fluid symptoms.   Follow-up plan: ICM clinic phone appointment on 11/25/2023.   91 day device clinic remote transmission 11/18/2023.     EP/Cardiology Office Visits:   Missed 08/30/2023 with Sherie Don, NP (6 month) and needs to reschedule.       Copy of ICM check sent to Dr. Graciela Husbands.    3 month ICM trend: 10/21/2023.    12-14 Month ICM trend:     Karie Soda, RN 10/24/2023 10:25 AM

## 2023-11-06 DIAGNOSIS — M65331 Trigger finger, right middle finger: Secondary | ICD-10-CM | POA: Diagnosis not present

## 2023-11-18 ENCOUNTER — Ambulatory Visit (INDEPENDENT_AMBULATORY_CARE_PROVIDER_SITE_OTHER): Payer: Medicare HMO

## 2023-11-18 DIAGNOSIS — I5022 Chronic systolic (congestive) heart failure: Secondary | ICD-10-CM | POA: Diagnosis not present

## 2023-11-18 DIAGNOSIS — I428 Other cardiomyopathies: Secondary | ICD-10-CM

## 2023-11-18 LAB — CUP PACEART REMOTE DEVICE CHECK
Battery Remaining Longevity: 17 mo
Battery Voltage: 2.9 V
Brady Statistic AP VP Percent: 1.27 %
Brady Statistic AP VS Percent: 0.05 %
Brady Statistic AS VP Percent: 97.11 %
Brady Statistic AS VS Percent: 1.57 %
Brady Statistic RA Percent Paced: 1.32 %
Brady Statistic RV Percent Paced: 0.95 %
Date Time Interrogation Session: 20250120012305
HighPow Impedance: 66 Ohm
Implantable Lead Connection Status: 753985
Implantable Lead Connection Status: 753985
Implantable Lead Connection Status: 753985
Implantable Lead Implant Date: 20170918
Implantable Lead Implant Date: 20170918
Implantable Lead Implant Date: 20170918
Implantable Lead Location: 753858
Implantable Lead Location: 753859
Implantable Lead Location: 753860
Implantable Lead Model: 5076
Implantable Pulse Generator Implant Date: 20170918
Lead Channel Impedance Value: 1007 Ohm
Lead Channel Impedance Value: 1045 Ohm
Lead Channel Impedance Value: 1045 Ohm
Lead Channel Impedance Value: 1121 Ohm
Lead Channel Impedance Value: 251.66 Ohm
Lead Channel Impedance Value: 272.552
Lead Channel Impedance Value: 272.552
Lead Channel Impedance Value: 278.237
Lead Channel Impedance Value: 304 Ohm
Lead Channel Impedance Value: 342 Ohm
Lead Channel Impedance Value: 399 Ohm
Lead Channel Impedance Value: 494 Ohm
Lead Channel Impedance Value: 494 Ohm
Lead Channel Impedance Value: 513 Ohm
Lead Channel Impedance Value: 608 Ohm
Lead Channel Impedance Value: 608 Ohm
Lead Channel Impedance Value: 950 Ohm
Lead Channel Impedance Value: 950 Ohm
Lead Channel Pacing Threshold Amplitude: 0.625 V
Lead Channel Pacing Threshold Amplitude: 0.75 V
Lead Channel Pacing Threshold Amplitude: 3 V
Lead Channel Pacing Threshold Pulse Width: 0.4 ms
Lead Channel Pacing Threshold Pulse Width: 0.4 ms
Lead Channel Pacing Threshold Pulse Width: 0.8 ms
Lead Channel Sensing Intrinsic Amplitude: 3 mV
Lead Channel Sensing Intrinsic Amplitude: 3 mV
Lead Channel Sensing Intrinsic Amplitude: 7.75 mV
Lead Channel Sensing Intrinsic Amplitude: 7.75 mV
Lead Channel Setting Pacing Amplitude: 2 V
Lead Channel Setting Pacing Amplitude: 2.5 V
Lead Channel Setting Pacing Amplitude: 3.5 V
Lead Channel Setting Pacing Pulse Width: 0.4 ms
Lead Channel Setting Pacing Pulse Width: 0.8 ms
Lead Channel Setting Sensing Sensitivity: 0.3 mV
Zone Setting Status: 755011

## 2023-11-25 ENCOUNTER — Ambulatory Visit: Payer: Medicare HMO | Attending: Internal Medicine

## 2023-11-25 DIAGNOSIS — Z95810 Presence of automatic (implantable) cardiac defibrillator: Secondary | ICD-10-CM | POA: Diagnosis not present

## 2023-11-25 DIAGNOSIS — I5022 Chronic systolic (congestive) heart failure: Secondary | ICD-10-CM

## 2023-11-26 NOTE — Progress Notes (Signed)
EPIC Encounter for ICM Monitoring  Patient Name: Margaret Hendricks is a 68 y.o. female Date: 11/26/2023 Primary Care Physican: [name], MD Primary Cardiologist: Graciela Husbands Electrophysiologist: Joycelyn Schmid Pacing:  98.2%                 11/20/2022 Weight: 174 lbs 11/27/2022 Weight: 168 lbs 06/27/2023 Office Weight: 172 lbs 11/26/2023 Weight: 172 lbs    Time in AT/AF    0.0 hr/day (0.0%)        Spoke with patient and heart failure questions reviewed.  Transmission results reviewed.  Pt reports weight increase of 10-12 lbs within the last 3 weeks (was 160 lbs).   Optivol thoracic impedance suggesting possible fluid accumulation starting 1/12.   Prescribed:  Furosemide 20 mg 1 tablet daily Spironolactone 25 mg take 1 tablet daily   Labs: 10/03/2023 Creatinine 0.86, BUN 19, Potassium 4.8, Sodium 139, GFR 75  06/18/2023 Creatinine 0.76, BUN 18, Potassium 4.4, Sodium 141, GFR 87  02/25/2023 Creatinine 0.84, BUN 12, Potassium 4.8, Sodium 138, GFR 78  A complete set of results can be found in Results Review.   Recommendations:  Advised to take extra Furosemide 20 mg daily x 2 days and then return to prescribed dosage of 1 tablet daily.     Follow-up plan: ICM clinic phone appointment on 12/02/2023 to recheck fluid levels.   91 day device clinic remote transmission 4/210/2025.     EP/Cardiology Office Visits:   Missed 08/30/2023 with Sherie Don, NP (6 month) and needs to reschedule.       Copy of ICM check sent to Dr. Graciela Husbands.    3 month ICM trend: 11/25/2023.    12-14 Month ICM trend:     Karie Soda, RN 11/26/2023 11:53 AM

## 2023-11-28 ENCOUNTER — Other Ambulatory Visit: Payer: Self-pay | Admitting: Family

## 2023-11-28 DIAGNOSIS — G44209 Tension-type headache, unspecified, not intractable: Secondary | ICD-10-CM

## 2023-12-02 ENCOUNTER — Ambulatory Visit: Payer: Medicare HMO | Attending: Internal Medicine

## 2023-12-02 DIAGNOSIS — I5022 Chronic systolic (congestive) heart failure: Secondary | ICD-10-CM

## 2023-12-02 DIAGNOSIS — Z95810 Presence of automatic (implantable) cardiac defibrillator: Secondary | ICD-10-CM

## 2023-12-25 ENCOUNTER — Encounter: Payer: Self-pay | Admitting: Internal Medicine

## 2023-12-26 NOTE — Progress Notes (Signed)
 Remote ICD transmission.

## 2023-12-30 ENCOUNTER — Ambulatory Visit: Payer: Medicare HMO | Attending: Internal Medicine

## 2023-12-30 ENCOUNTER — Ambulatory Visit: Payer: Medicare HMO | Admitting: Internal Medicine

## 2023-12-30 DIAGNOSIS — I5022 Chronic systolic (congestive) heart failure: Secondary | ICD-10-CM

## 2023-12-30 DIAGNOSIS — Z95810 Presence of automatic (implantable) cardiac defibrillator: Secondary | ICD-10-CM | POA: Diagnosis not present

## 2024-01-02 ENCOUNTER — Ambulatory Visit: Payer: Medicare HMO | Admitting: Internal Medicine

## 2024-01-03 ENCOUNTER — Telehealth: Payer: Self-pay

## 2024-01-03 NOTE — Progress Notes (Signed)
 EPIC Encounter for ICM Monitoring  Patient Name: Margaret Hendricks is a 68 y.o. female Date: 01/03/2024 Primary Care Physican: [name], MD Primary Cardiologist: Graciela Husbands Electrophysiologist: Joycelyn Schmid Pacing:  98.3%                 11/20/2022 Weight: 174 lbs 11/27/2022 Weight: 168 lbs 06/27/2023 Office Weight: 172 lbs 11/26/2023 Weight: 172 lbs  12/04/2023 Weight: 168 lbs   Time in AT/AF    0.0 hr/day (0.0%)         Attempted call to patient and unable to reach.  Left detailed message per DPR regarding transmission.  Transmission results reviewed.    Optivol thoracic impedance suggesting normal fluid levels since 1/28.   Prescribed:  Furosemide 20 mg 1 tablet daily Spironolactone 25 mg take 1 tablet daily   Labs: 10/03/2023 Creatinine 0.86, BUN 19, Potassium 4.8, Sodium 139, GFR 75  06/18/2023 Creatinine 0.76, BUN 18, Potassium 4.4, Sodium 141, GFR 87  02/25/2023 Creatinine 0.84, BUN 12, Potassium 4.8, Sodium 138, GFR 78  A complete set of results can be found in Results Review.   Recommendations:  Left voice mail with ICM number and encouraged to call if experiencing any fluid symptoms.   Follow-up plan: ICM clinic phone appointment on 02/03/2024.   91 day device clinic remote transmission 02/17/2024.     EP/Cardiology Office Visits:   Missed 08/30/2023 with Sherie Don, NP (6 month) and needs to reschedule.       Copy of ICM check sent to Dr. Graciela Husbands.    3 month ICM trend: 12/30/2023.    12-14 Month ICM trend:     Karie Soda, RN 01/03/2024 12:32 PM

## 2024-01-03 NOTE — Telephone Encounter (Signed)
 Remote ICM transmission received.  Attempted call to patient regarding ICM remote transmission and left detailed message per DPR.  Left ICM phone number and advised to return call for any fluid symptoms or questions. Next ICM remote transmission scheduled 02/03/2024.

## 2024-01-06 ENCOUNTER — Ambulatory Visit (INDEPENDENT_AMBULATORY_CARE_PROVIDER_SITE_OTHER): Admitting: Internal Medicine

## 2024-01-06 ENCOUNTER — Encounter: Payer: Self-pay | Admitting: Internal Medicine

## 2024-01-06 ENCOUNTER — Ambulatory Visit: Admitting: Internal Medicine

## 2024-01-06 VITALS — BP 124/70 | HR 65 | Ht 62.0 in | Wt 173.8 lb

## 2024-01-06 DIAGNOSIS — E1169 Type 2 diabetes mellitus with other specified complication: Secondary | ICD-10-CM | POA: Diagnosis not present

## 2024-01-06 DIAGNOSIS — E1159 Type 2 diabetes mellitus with other circulatory complications: Secondary | ICD-10-CM | POA: Diagnosis not present

## 2024-01-06 DIAGNOSIS — I152 Hypertension secondary to endocrine disorders: Secondary | ICD-10-CM | POA: Diagnosis not present

## 2024-01-06 DIAGNOSIS — I5022 Chronic systolic (congestive) heart failure: Secondary | ICD-10-CM

## 2024-01-06 DIAGNOSIS — E782 Mixed hyperlipidemia: Secondary | ICD-10-CM

## 2024-01-06 DIAGNOSIS — K219 Gastro-esophageal reflux disease without esophagitis: Secondary | ICD-10-CM | POA: Diagnosis not present

## 2024-01-06 DIAGNOSIS — E1165 Type 2 diabetes mellitus with hyperglycemia: Secondary | ICD-10-CM | POA: Diagnosis not present

## 2024-01-06 LAB — POCT CBG (FASTING - GLUCOSE)-MANUAL ENTRY: Glucose Fasting, POC: 107 mg/dL — AB (ref 70–99)

## 2024-01-06 NOTE — Progress Notes (Signed)
 Established Patient Office Visit  Subjective:  Patient ID: Margaret Hendricks, female    DOB: [birth year]  Age: 68 y.o. MRN: [id number]  Chief Complaint  Patient presents with   Follow-up    3 month follow up    Patient is here for her follow-up.  She is generally feeling well and taking all her medications.  Denies any chest pain or shortness of breath.  No nausea vomiting, and no abdominal pain.  She is not taking her Lyrica anymore. Last labs were stable, will skip this time.    No other concerns at this time.   Past Medical History:  Diagnosis Date   Abnormal heart rhythm    AICD (automatic cardioverter/defibrillator) present    Anemia    Cancer (HCC)    skin   Chronic systolic CHF (congestive heart failure), NYHA class 2- 3 (HCC) 07/03/2016   COPD (chronic obstructive pulmonary disease) (HCC)    Diabetes mellitus without complication (HCC)    History of colonic polyps    LBBB (left bundle branch block) 07/03/2016   NICM (nonischemic cardiomyopathy) (HCC) 07/03/2016   Shortness of breath     Past Surgical History:  Procedure Laterality Date   APPENDECTOMY     CARDIAC CATHETERIZATION Left 03/14/2015   Procedure: Left Heart Cath;  Surgeon: Laurier Nancy, MD;  Location: [facility] INVASIVE CV LAB;  Service: Cardiovascular;  Laterality: Left;   COLONOSCOPY WITH PROPOFOL N/A 05/12/2018   Procedure: COLONOSCOPY WITH PROPOFOL;  Surgeon: Christena Deem, MD;  Location: [facility] ENDOSCOPY;  Service: Endoscopy;  Laterality: N/A;   EP IMPLANTABLE DEVICE N/A 07/16/2016   Procedure: BiV ICD Insertion CRT-D;  Surgeon: Duke Salvia, MD;  Location: [facility] INVASIVE CV LAB;  Service: Cardiovascular;  Laterality: N/A;   ESOPHAGOGASTRODUODENOSCOPY (EGD) WITH PROPOFOL N/A 05/12/2018   Procedure: ESOPHAGOGASTRODUODENOSCOPY (EGD) WITH PROPOFOL;  Surgeon: Christena Deem, MD;  Location: [facility] ENDOSCOPY;  Service: Endoscopy;  Laterality: N/A;   ICD IMPLANT     INSERT / REPLACE / REMOVE PACEMAKER      TUBAL LIGATION Bilateral     Social History   Socioeconomic History   Marital status: Single    Spouse name: Not on file   Number of children: Not on file   Years of education: Not on file   Highest education level: Not on file  Occupational History   Not on file  Tobacco Use   Smoking status: Former    Current packs/day: 0.00    Average packs/day: 0.1 packs/day for 30.0 years (3.0 ttl pk-yrs)    Types: Cigarettes    Quit date: 2021    Years since quitting: 4.1   Smokeless tobacco: Never   Tobacco comments:    Margaret Hendricks admits she smokes 1/2 cigarette in am on the way to work and 1/2 cigarette on the way home from work.   Vaping Use   Vaping status: Never Used  Substance and Sexual Activity   Alcohol use: No    Alcohol/week: 0.0 standard drinks of alcohol   Drug use: Never    Types: Marijuana   Sexual activity: Not on file  Other Topics Concern   Not on file  Social History Narrative   Not on file   Social Drivers of Health   Financial Resource Strain: Low Risk  (09/30/2023)   Overall Financial Resource Strain (CARDIA)    Difficulty of Paying Living Expenses: Not hard at all  Food Insecurity: No Food Insecurity (09/30/2023)   Hunger Vital Sign  Worried About Programme researcher, broadcasting/film/video in the Last Year: Never true    Ran Out of Food in the Last Year: Never true  Transportation Needs: No Transportation Needs (09/30/2023)   PRAPARE - Administrator, Civil Service (Medical): No    Lack of Transportation (Non-Medical): No  Physical Activity: Not on file  Stress: No Stress Concern Present (09/30/2023)   [employer] of Occupational Health - Occupational Stress Questionnaire    Feeling of Stress : Only a little  Social Connections: Not on file  Intimate Partner Violence: Not on file    Family History  Problem Relation Age of Onset   Hypertension Other    Heart attack Other    Breast cancer Neg Hx     Allergies  Allergen Reactions   Tape Other (See  Comments)    Adhesive-silicones    Outpatient Medications Prior to Visit  Medication Sig   amitriptyline (ELAVIL) 10 MG tablet TAKE 1 TABLET AT BEDTIME   atorvastatin (LIPITOR) 80 MG tablet TAKE 1 TABLET EVERY DAY   baclofen (LIORESAL) 10 MG tablet Take 1 tablet (10 mg total) by mouth daily.   calcium carbonate (OSCAL) 1500 (600 Ca) MG TABS tablet Take by mouth 2 (two) times daily with a meal.   carvedilol (COREG) 12.5 MG tablet TAKE 1 TABLET TWICE DAILY (NEED TO MAKE APPOINTMENT FOR MORE REFILLS)   celecoxib (CELEBREX) 200 MG capsule TAKE 1 CAPSULE EVERY DAY   cholecalciferol (VITAMIN D) 1000 units tablet Take 1,000 Units by mouth daily.   Coenzyme Q10 (COQ-10) 30 MG CAPS Take by mouth.   ferrous sulfate 325 (65 FE) MG tablet Take 325 mg by mouth 2 (two) times daily with a meal.   furosemide (LASIX) 20 MG tablet TAKE 1 TABLET EVERY DAY (NEED MD APPOINTMENT)   metFORMIN (GLUCOPHAGE) 500 MG tablet TAKE 1 TABLET EVERY DAY   pantoprazole (PROTONIX) 40 MG tablet Take 1 tablet (40 mg total) by mouth daily.   sacubitril-valsartan (ENTRESTO) 97-103 MG Take 1 tablet by mouth 2 (two) times daily.   sertraline (ZOLOFT) 50 MG tablet TAKE 1 TABLET EVERY DAY (NEED MD APPOINTMENT)   [DISCONTINUED] pregabalin (LYRICA) 25 MG capsule Take 25 mg by mouth 2 (two) times daily. 2 in the am and 2 pm   Multiple Vitamins-Minerals (ICAPS AREDS FORMULA PO) Take 1 tablet by mouth daily. (Patient not taking: Reported on 01/06/2024)   spironolactone (ALDACTONE) 25 MG tablet Take 25 mg by mouth daily.   No facility-administered medications prior to visit.    Review of Systems  Constitutional: Negative.  Negative for chills, fever, malaise/fatigue and weight loss.  HENT: Negative.  Negative for congestion, hearing loss and sinus pain.   Eyes: Negative.   Respiratory: Negative.  Negative for cough and shortness of breath.   Cardiovascular: Negative.  Negative for chest pain, palpitations and leg swelling.   Gastrointestinal: Negative.  Negative for abdominal pain, constipation, diarrhea, heartburn, nausea and vomiting.  Genitourinary: Negative.  Negative for dysuria and flank pain.  Musculoskeletal: Negative.  Negative for joint pain and myalgias.  Skin: Negative.   Neurological: Negative.  Negative for dizziness and headaches.  Endo/Heme/Allergies: Negative.   Psychiatric/Behavioral: Negative.  Negative for depression and suicidal ideas. The patient is not nervous/anxious.        Objective:   BP 124/70   Pulse 65   Ht 5\' 2"  (1.575 m)   Wt 173 lb 12.8 oz (78.8 kg)   SpO2 98%   BMI 31.79  kg/m   Vitals:   01/06/24 1153  BP: 124/70  Pulse: 65  Height: 5\' 2"  (1.575 m)  Weight: 173 lb 12.8 oz (78.8 kg)  SpO2: 98%  BMI (Calculated): 31.78    Physical Exam Vitals and nursing note reviewed.  Constitutional:      Appearance: Normal appearance.  HENT:     Head: Normocephalic and atraumatic.     Nose: Nose normal.     Mouth/Throat:     Mouth: Mucous membranes are moist.     Pharynx: Oropharynx is clear.  Eyes:     Conjunctiva/sclera: Conjunctivae normal.     Pupils: Pupils are equal, round, and reactive to light.  Cardiovascular:     Rate and Rhythm: Normal rate and regular rhythm.     Pulses: Normal pulses.     Heart sounds: Normal heart sounds. No murmur heard. Pulmonary:     Effort: Pulmonary effort is normal.     Breath sounds: Normal breath sounds. No wheezing.  Abdominal:     General: Bowel sounds are normal.     Palpations: Abdomen is soft.     Tenderness: There is no abdominal tenderness. There is no right CVA tenderness or left CVA tenderness.  Musculoskeletal:        General: Normal range of motion.     Cervical back: Normal range of motion.     Right lower leg: No edema.     Left lower leg: No edema.  Skin:    General: Skin is warm and dry.  Neurological:     General: No focal deficit present.     Mental Status: She is alert and oriented to person,  place, and time.  Psychiatric:        Mood and Affect: Mood normal.        Behavior: Behavior normal.      Results for orders placed or performed in visit on 01/06/24  POCT CBG (Fasting - Glucose)  Result Value Ref Range   Glucose Fasting, POC 107 (A) 70 - 99 mg/dL    Recent Results (from the past 2160 hours)  CUP PACEART REMOTE DEVICE CHECK     Status: None   Collection Time: 11/18/23  1:23 AM  Result Value Ref Range   Date Time Interrogation Session [id number]    Pulse Generator Manufacturer MERM    Pulse Gen Model [id number] Claria MRI Quad CRT-D    Pulse Gen Serial Number [id number]    Clinic Name [facility]     Implantable Pulse Generator Type Cardiac Resynch Therapy Defibulator    Implantable Pulse Generator Implant Date [id number]    Implantable Lead Manufacturer MERM    Implantable Lead Model 5076 CapSureFix Novus MRI SureScan    Implantable Lead Serial Number [id number]    Implantable Lead Implant Date [id number]    Implantable Lead Location Detail 1 APPENDAGE    Implantable Lead Location [id number]    Implantable Lead Connection Status [id number]    Implantable Lead Manufacturer [facility]    Implantable Lead Model 671-411-2046 Sprint Quattro Secure S MRI SureScan    Implantable Lead Serial Number [id number]    Implantable Lead Implant Date [id number]    Implantable Lead Location Detail 1 APEX    Implantable Lead Location [id number]    Implantable Lead Connection Status [id number]    Implantable Lead Manufacturer [facility]    Implantable Lead Model 1458Q Quartet    Implantable Lead Serial Number [id number]    Implantable Lead Implant Date 60630160    Implantable Lead  Location Detail 1 UNKNOWN    Implantable Lead Location [id number]    Implantable Lead Connection Status [phone]    Lead Channel Setting Sensing Sensitivity 0.3 mV   Lead Channel Setting Pacing Amplitude 2 V   Lead Channel Setting Pacing Pulse Width 0.4 ms   Lead Channel Setting Pacing Amplitude 2.5 V   Lead Channel Setting Pacing Pulse  Width 0.8 ms   Lead Channel Setting Pacing Amplitude 3.5 V   Lead Channel Setting Pacing Capture Mode Adaptive Capture    Zone Setting Status Active    Zone Setting Status Inactive    Zone Setting Status Inactive    Zone Setting Status Inactive    Zone Setting Status [phone]    Lead Channel Impedance Value 399 ohm   Lead Channel Sensing Intrinsic Amplitude 3 mV   Lead Channel Sensing Intrinsic Amplitude 3 mV   Lead Channel Pacing Threshold Amplitude 0.625 V   Lead Channel Pacing Threshold Pulse Width 0.4 ms   Lead Channel Impedance Value 494 ohm   Lead Channel Impedance Value 342 ohm   Lead Channel Sensing Intrinsic Amplitude 7.75 mV   Lead Channel Sensing Intrinsic Amplitude 7.75 mV   Lead Channel Pacing Threshold Amplitude 0.75 V   Lead Channel Pacing Threshold Pulse Width 0.4 ms   HighPow Impedance 66 ohm   Lead Channel Impedance Value 1,007 ohm   Lead Channel Impedance Value 1,121 ohm   Lead Channel Impedance Value 1,045 ohm   Lead Channel Impedance Value 950 ohm   Lead Channel Impedance Value 950 ohm   Lead Channel Impedance Value 1,045 ohm   Lead Channel Impedance Value 608 ohm   Lead Channel Impedance Value 513 ohm   Lead Channel Impedance Value 608 ohm   Lead Channel Impedance Value 494 ohm   Lead Channel Impedance Value 278.237    Lead Channel Impedance Value 304 ohm   Lead Channel Impedance Value 272.552    Lead Channel Impedance Value 251.66 ohm   Lead Channel Impedance Value 272.552    Lead Channel Pacing Threshold Amplitude 3 V   Lead Channel Pacing Threshold Pulse Width 0.8 ms   Battery Status OK    Battery Remaining Longevity 17 mo   Battery Voltage 2.90 V   Brady Statistic RA Percent Paced 1.32 %   Brady Statistic RV Percent Paced 0.95 %   Brady Statistic AP VP Percent 1.27 %   Brady Statistic AS VP Percent 97.11 %   Brady Statistic AP VS Percent 0.05 %   Brady Statistic AS VS Percent 1.57 %  POCT CBG (Fasting - Glucose)     Status: Abnormal   Collection  Time: 01/06/24 11:59 AM  Result Value Ref Range   Glucose Fasting, POC 107 (A) 70 - 99 mg/dL      Assessment & Plan:  Continue current medications.  Strict diet control emphasized.  Will check labs and schedule mammogram at next visit. Problem List Items Addressed This Visit     Chronic systolic CHF (congestive heart failure), NYHA class 2- 3 (HCC)   Gastroesophageal reflux disease without esophagitis   Type 2 diabetes mellitus with hyperglycemia, without long-term current use of insulin (HCC) - Primary   Relevant Orders   POCT CBG (Fasting - Glucose) (Completed)   Hypertension associated with diabetes (HCC)   Combined hyperlipidemia associated with type 2 diabetes mellitus (HCC)    Return in about 3 months (around 04/07/2024).   Total time spent: 30 minutes  [name], MD  01/06/2024  This document may have been prepared by [employer] Voice Recognition software and as such may include unintentional dictation errors.

## 2024-01-19 ENCOUNTER — Other Ambulatory Visit: Payer: Self-pay | Admitting: Internal Medicine

## 2024-01-20 ENCOUNTER — Other Ambulatory Visit: Payer: Self-pay | Admitting: Internal Medicine

## 2024-01-20 MED ORDER — CARVEDILOL 12.5 MG PO TABS: 12.5000 mg | ORAL_TABLET | Freq: Two times a day (BID) | ORAL | 0 refills | Status: DC

## 2024-02-03 ENCOUNTER — Ambulatory Visit: Attending: Internal Medicine

## 2024-02-03 DIAGNOSIS — I5022 Chronic systolic (congestive) heart failure: Secondary | ICD-10-CM

## 2024-02-03 DIAGNOSIS — Z95810 Presence of automatic (implantable) cardiac defibrillator: Secondary | ICD-10-CM

## 2024-02-06 ENCOUNTER — Telehealth: Payer: Self-pay

## 2024-02-06 NOTE — Progress Notes (Signed)
 EPIC Encounter for ICM Monitoring  Patient Name: Margaret Hendricks is a 68 y.o. female Date: 02/06/2024 Primary Care Physican: [name], MD Primary Cardiologist: Graciela Husbands Electrophysiologist: Joycelyn Schmid Pacing:  98.2%                 11/20/2022 Weight: 174 lbs 11/27/2022 Weight: 168 lbs 06/27/2023 Office Weight: 172 lbs 11/26/2023 Weight: 172 lbs  12/04/2023 Weight: 168 lbs   Time in AT/AF    0.0 hr/day (0.0%)         Attempted call to patient and unable to reach.  Left detailed message per DPR regarding transmission.  Transmission results reviewed.    Optivol thoracic impedance suggesting normal fluid levels within the last month.   Prescribed:  Furosemide 20 mg 1 tablet daily Spironolactone 25 mg take 1 tablet daily   Labs: 10/03/2023 Creatinine 0.86, BUN 19, Potassium 4.8, Sodium 139, GFR 75  06/18/2023 Creatinine 0.76, BUN 18, Potassium 4.4, Sodium 141, GFR 87  02/25/2023 Creatinine 0.84, BUN 12, Potassium 4.8, Sodium 138, GFR 78  A complete set of results can be found in Results Review.   Recommendations:  Left voice mail with ICM number and encouraged to call if experiencing any fluid symptoms.   Follow-up plan: ICM clinic phone appointment on 03/09/2024.   91 day device clinic remote transmission 02/17/2024.     EP/Cardiology Office Visits:  Last EP office visit 03/02/2023 (needs to schedule yearly visit, no recall)       Copy of ICM check sent to Dr. Graciela Husbands.    3 month ICM trend: 02/03/2024.    12-14 Month ICM trend:     Karie Soda, RN 02/06/2024 1:19 PM

## 2024-02-06 NOTE — Telephone Encounter (Signed)
 Remote ICM transmission received.  Attempted call to patient regarding ICM remote transmission and left detailed message per DPR.  Left ICM phone number and advised to return call for any fluid symptoms or questions. Next ICM remote transmission scheduled 03/09/2024.

## 2024-02-09 ENCOUNTER — Other Ambulatory Visit: Payer: Self-pay | Admitting: Internal Medicine

## 2024-02-11 ENCOUNTER — Other Ambulatory Visit: Payer: Self-pay | Admitting: Internal Medicine

## 2024-02-12 NOTE — Telephone Encounter (Signed)
 Dr. Doyle Generous pt. Passed her 3rd attempt. Does Dr. Rodolfo Clan want to refill? Please advise.

## 2024-02-17 ENCOUNTER — Ambulatory Visit (INDEPENDENT_AMBULATORY_CARE_PROVIDER_SITE_OTHER): Payer: Medicare HMO

## 2024-02-17 DIAGNOSIS — I428 Other cardiomyopathies: Secondary | ICD-10-CM

## 2024-02-17 DIAGNOSIS — I5022 Chronic systolic (congestive) heart failure: Secondary | ICD-10-CM

## 2024-02-18 LAB — CUP PACEART REMOTE DEVICE CHECK
Battery Remaining Longevity: 13 mo
Battery Voltage: 2.88 V
Brady Statistic AP VP Percent: 1.41 %
Brady Statistic AP VS Percent: 0.06 %
Brady Statistic AS VP Percent: 96.74 %
Brady Statistic AS VS Percent: 1.78 %
Brady Statistic RA Percent Paced: 1.47 %
Brady Statistic RV Percent Paced: 3.85 %
Date Time Interrogation Session: 20250421043724
HighPow Impedance: 74 Ohm
Implantable Lead Connection Status: 753985
Implantable Lead Connection Status: 753985
Implantable Lead Connection Status: 753985
Implantable Lead Implant Date: 20170918
Implantable Lead Implant Date: 20170918
Implantable Lead Implant Date: 20170918
Implantable Lead Location: 753858
Implantable Lead Location: 753859
Implantable Lead Location: 753860
Implantable Lead Model: 5076
Implantable Pulse Generator Implant Date: 20170918
Lead Channel Impedance Value: 1007 Ohm
Lead Channel Impedance Value: 1064 Ohm
Lead Channel Impedance Value: 237.12 Ohm
Lead Channel Impedance Value: 249.509
Lead Channel Impedance Value: 267.31 Ohm
Lead Channel Impedance Value: 279.933
Lead Channel Impedance Value: 297.365
Lead Channel Impedance Value: 380 Ohm
Lead Channel Impedance Value: 380 Ohm
Lead Channel Impedance Value: 399 Ohm
Lead Channel Impedance Value: 456 Ohm
Lead Channel Impedance Value: 494 Ohm
Lead Channel Impedance Value: 551 Ohm
Lead Channel Impedance Value: 646 Ohm
Lead Channel Impedance Value: 874 Ohm
Lead Channel Impedance Value: 893 Ohm
Lead Channel Impedance Value: 893 Ohm
Lead Channel Impedance Value: 988 Ohm
Lead Channel Pacing Threshold Amplitude: 0.5 V
Lead Channel Pacing Threshold Amplitude: 0.75 V
Lead Channel Pacing Threshold Amplitude: 3 V
Lead Channel Pacing Threshold Pulse Width: 0.4 ms
Lead Channel Pacing Threshold Pulse Width: 0.4 ms
Lead Channel Pacing Threshold Pulse Width: 0.8 ms
Lead Channel Sensing Intrinsic Amplitude: 2.75 mV
Lead Channel Sensing Intrinsic Amplitude: 2.75 mV
Lead Channel Sensing Intrinsic Amplitude: 9.75 mV
Lead Channel Sensing Intrinsic Amplitude: 9.75 mV
Lead Channel Setting Pacing Amplitude: 2 V
Lead Channel Setting Pacing Amplitude: 2.5 V
Lead Channel Setting Pacing Amplitude: 3.5 V
Lead Channel Setting Pacing Pulse Width: 0.4 ms
Lead Channel Setting Pacing Pulse Width: 0.8 ms
Lead Channel Setting Sensing Sensitivity: 0.3 mV
Zone Setting Status: 755011

## 2024-03-09 ENCOUNTER — Ambulatory Visit: Attending: Cardiology

## 2024-03-09 DIAGNOSIS — I5022 Chronic systolic (congestive) heart failure: Secondary | ICD-10-CM

## 2024-03-09 DIAGNOSIS — Z95810 Presence of automatic (implantable) cardiac defibrillator: Secondary | ICD-10-CM

## 2024-03-13 NOTE — Progress Notes (Signed)
 EPIC Encounter for ICM Monitoring  Patient Name: Margaret Hendricks is a 68 y.o. female Date: 03/13/2024 Primary Care Physican: Aisha Hove, MD Primary Cardiologist:  Daneil Dunker Electrophysiologist: Gareth Junes Pacing:  98.5%                 11/20/2022 Weight: 174 lbs 11/27/2022 Weight: 168 lbs 06/27/2023 Office Weight: 172 lbs 11/26/2023 Weight: 172 lbs  12/04/2023 Weight: 168 lbs   Time in AT/AF    0.0 hr/day (0.0%)         Transmission results reviewed and results sent via mychart.    Optivol thoracic impedance suggesting normal fluid levels with the exception of possible fluid accumulation from 4/18-4/29.   Prescribed:  Furosemide  20 mg 1 tablet daily Spironolactone  25 mg take 1 tablet daily   Labs: 10/03/2023 Creatinine 0.86, BUN 19, Potassium 4.8, Sodium 139, GFR 75  06/18/2023 Creatinine 0.76, BUN 18, Potassium 4.4, Sodium 141, GFR 87  02/25/2023 Creatinine 0.84, BUN 12, Potassium 4.8, Sodium 138, GFR 78  A complete set of results can be found in Results Review.   Recommendations:  Left ICM number via mychart and encouraged to call if experiencing any fluid symptoms.   Follow-up plan: ICM clinic phone appointment on 04/13/2024.   91 day device clinic remote transmission 05/18/2024.     EP/Cardiology Office Visits:   EP scheduler number sent via mychart to schedule yearly appt.    Last EP office visit 03/07/2023 (needs to schedule yearly visit, no recall)       Copy of ICM check sent to Dr. Daneil Dunker.     3 month ICM trend: 03/11/2024.    12-14 Month ICM trend:     Almyra Jain, RN 03/13/2024 7:50 AM

## 2024-03-25 ENCOUNTER — Other Ambulatory Visit: Payer: Self-pay | Admitting: Internal Medicine

## 2024-04-06 NOTE — Addendum Note (Signed)
 Addended by: Edra Govern D on: 04/06/2024 12:06 PM   Modules accepted: Orders

## 2024-04-06 NOTE — Progress Notes (Signed)
 Remote ICD transmission.

## 2024-04-07 ENCOUNTER — Encounter: Payer: Self-pay | Admitting: Internal Medicine

## 2024-04-07 ENCOUNTER — Ambulatory Visit: Payer: Self-pay | Admitting: Internal Medicine

## 2024-04-07 ENCOUNTER — Ambulatory Visit (INDEPENDENT_AMBULATORY_CARE_PROVIDER_SITE_OTHER): Admitting: Internal Medicine

## 2024-04-07 VITALS — BP 140/88 | HR 66 | Ht 62.0 in | Wt 166.2 lb

## 2024-04-07 DIAGNOSIS — E1169 Type 2 diabetes mellitus with other specified complication: Secondary | ICD-10-CM

## 2024-04-07 DIAGNOSIS — I152 Hypertension secondary to endocrine disorders: Secondary | ICD-10-CM

## 2024-04-07 DIAGNOSIS — E782 Mixed hyperlipidemia: Secondary | ICD-10-CM | POA: Diagnosis not present

## 2024-04-07 DIAGNOSIS — E1159 Type 2 diabetes mellitus with other circulatory complications: Secondary | ICD-10-CM

## 2024-04-07 DIAGNOSIS — D696 Thrombocytopenia, unspecified: Secondary | ICD-10-CM | POA: Diagnosis not present

## 2024-04-07 DIAGNOSIS — E1165 Type 2 diabetes mellitus with hyperglycemia: Secondary | ICD-10-CM | POA: Diagnosis not present

## 2024-04-07 DIAGNOSIS — Z1231 Encounter for screening mammogram for malignant neoplasm of breast: Secondary | ICD-10-CM

## 2024-04-07 DIAGNOSIS — H6191 Disorder of right external ear, unspecified: Secondary | ICD-10-CM

## 2024-04-07 LAB — POCT CBG (FASTING - GLUCOSE)-MANUAL ENTRY: Glucose Fasting, POC: 128 mg/dL — AB (ref 70–99)

## 2024-04-07 MED ORDER — MUPIROCIN 2 % EX OINT: TOPICAL_OINTMENT | CUTANEOUS | 0 refills | Status: DC

## 2024-04-07 NOTE — Progress Notes (Signed)
 Established Patient Office Visit  Subjective:  Patient ID: Margaret Hendricks, female    DOB: December 16, 1955  Age: 68 y.o. MRN: [id number]  Chief Complaint  Patient presents with   Follow-up    3 month follow up    Patient comes in for her follow-up today.  She is generally feeling well.  Her blood pressure is high as she is fasting for blood work and did not take her medications.  She denies any chest pain or shortness of breath.  She has lost some weight with diet control.  Patient notes a dry , irritated, recurrent spot on top of her right ear.  Will send a referral to dermatology for further evaluation. Also mentions her nose being dry and sometimes feels tiny sores inside the nares.  Advised to use saline nasal spray.  Will also send a prescription for Bactroban  ointment for the nares.    No other concerns at this time.   Past Medical History:  Diagnosis Date   Abnormal heart rhythm    AICD (automatic cardioverter/defibrillator) present    Anemia    Cancer (HCC)    skin   Chronic systolic CHF (congestive heart failure), NYHA class 2- 3 (HCC) 07/03/2016   COPD (chronic obstructive pulmonary disease) (HCC)    Diabetes mellitus without complication (HCC)    History of colonic polyps    LBBB (left bundle branch block) 07/03/2016   NICM (nonischemic cardiomyopathy) (HCC) 07/03/2016   Shortness of breath     Past Surgical History:  Procedure Laterality Date   APPENDECTOMY     CARDIAC CATHETERIZATION Left 03/14/2015   Procedure: Left Heart Cath;  Surgeon: Cherrie Cornwall, MD;  Location: [facility] INVASIVE CV LAB;  Service: Cardiovascular;  Laterality: Left;   COLONOSCOPY WITH PROPOFOL  N/A 05/12/2018   Procedure: COLONOSCOPY WITH PROPOFOL ;  Surgeon: Deveron Fly, MD;  Location: [facility] ENDOSCOPY;  Service: Endoscopy;  Laterality: N/A;   EP IMPLANTABLE DEVICE N/A 07/16/2016   Procedure: BiV ICD Insertion CRT-D;  Surgeon: Verona Goodwill, MD;  Location: [facility] INVASIVE CV LAB;  Service:  Cardiovascular;  Laterality: N/A;   ESOPHAGOGASTRODUODENOSCOPY (EGD) WITH PROPOFOL  N/A 05/12/2018   Procedure: ESOPHAGOGASTRODUODENOSCOPY (EGD) WITH PROPOFOL ;  Surgeon: Deveron Fly, MD;  Location: [facility] ENDOSCOPY;  Service: Endoscopy;  Laterality: N/A;   ICD IMPLANT     INSERT / REPLACE / REMOVE PACEMAKER     TUBAL LIGATION Bilateral     Social History   Socioeconomic History   Marital status: Single    Spouse name: Not on file   Number of children: Not on file   Years of education: Not on file   Highest education level: Not on file  Occupational History   Not on file  Tobacco Use   Smoking status: Former    Current packs/day: 0.00    Average packs/day: 0.1 packs/day for 30.0 years (3.0 ttl pk-yrs)    Types: Cigarettes    Quit date: 2021    Years since quitting: 4.4   Smokeless tobacco: Never   Tobacco comments:    Nelson admits she smokes 1/2 cigarette in am on the way to work and 1/2 cigarette on the way home from work.   Vaping Use   Vaping status: Never Used  Substance and Sexual Activity   Alcohol use: No    Alcohol/week: 0.0 standard drinks of alcohol   Drug use: Never    Types: Marijuana   Sexual activity: Not on file  Other Topics Concern  Not on file  Social History Narrative   Not on file   Social Drivers of Health   Financial Resource Strain: Low Risk  (09/30/2023)   Overall Financial Resource Strain (CARDIA)    Difficulty of Paying Living Expenses: Not hard at all  Food Insecurity: No Food Insecurity (09/30/2023)   Hunger Vital Sign    Worried About Running Out of Food in the Last Year: Never true    Ran Out of Food in the Last Year: Never true  Transportation Needs: No Transportation Needs (09/30/2023)   PRAPARE - Administrator, Civil Service (Medical): No    Lack of Transportation (Non-Medical): No  Physical Activity: Not on file  Stress: No Stress Concern Present (09/30/2023)   [employer] of Occupational Health -  Occupational Stress Questionnaire    Feeling of Stress : Only a little  Social Connections: Not on file  Intimate Partner Violence: Not on file    Family History  Problem Relation Age of Onset   Hypertension Other    Heart attack Other    Breast cancer Neg Hx     Allergies  Allergen Reactions   Tape Other (See Comments)    Adhesive-silicones    Outpatient Medications Prior to Visit  Medication Sig   amitriptyline (ELAVIL) 10 MG tablet TAKE 1 TABLET AT BEDTIME   atorvastatin (LIPITOR) 80 MG tablet TAKE 1 TABLET EVERY DAY   baclofen  (LIORESAL ) 10 MG tablet Take 1 tablet (10 mg total) by mouth daily.   calcium carbonate (OSCAL) 1500 (600 Ca) MG TABS tablet Take by mouth 2 (two) times daily with a meal.   carvedilol  (COREG ) 12.5 MG tablet TAKE 1 TABLET TWICE DAILY WITH MEALS (PLEASE SCHEDULE AN APPOINTMENT WITH CARDIOLOGY FOR FUTURE REFILLS)   celecoxib (CELEBREX) 200 MG capsule TAKE 1 CAPSULE EVERY DAY   cholecalciferol (VITAMIN D) 1000 units tablet Take 1,000 Units by mouth daily.   Coenzyme Q10 (COQ-10) 30 MG CAPS Take by mouth.   ferrous sulfate 325 (65 FE) MG tablet Take 325 mg by mouth 2 (two) times daily with a meal.   furosemide  (LASIX ) 20 MG tablet TAKE 1 TABLET EVERY DAY (NEED MD APPOINTMENT)   metFORMIN (GLUCOPHAGE) 500 MG tablet TAKE 1 TABLET EVERY DAY   pantoprazole  (PROTONIX ) 40 MG tablet Take 1 tablet (40 mg total) by mouth daily.   sacubitril -valsartan  (ENTRESTO ) 97-103 MG Take 1 tablet by mouth 2 (two) times daily.   sertraline (ZOLOFT) 50 MG tablet TAKE 1 TABLET EVERY DAY (NEED MD APPOINTMENT)   spironolactone  (ALDACTONE ) 25 MG tablet Take 25 mg by mouth daily.   Multiple Vitamins-Minerals (ICAPS AREDS FORMULA PO) Take 1 tablet by mouth daily. (Patient not taking: Reported on 04/07/2024)   No facility-administered medications prior to visit.    Review of Systems  Constitutional: Negative.  Negative for chills, fever, malaise/fatigue and weight loss.  HENT:  Negative.  Negative for congestion, sinus pain and sore throat.   Eyes: Negative.   Respiratory: Negative.  Negative for cough and shortness of breath.   Cardiovascular: Negative.  Negative for chest pain, palpitations and leg swelling.  Gastrointestinal: Negative.  Negative for abdominal pain, constipation, diarrhea, heartburn, nausea and vomiting.  Genitourinary: Negative.  Negative for dysuria and flank pain.  Musculoskeletal: Negative.  Negative for joint pain and myalgias.  Skin: Negative.   Neurological: Negative.  Negative for dizziness, tingling, speech change and headaches.  Endo/Heme/Allergies: Negative.   Psychiatric/Behavioral: Negative.  Negative for depression and suicidal ideas. The  patient is not nervous/anxious.        Objective:   BP (!) 140/88   Pulse 66   Ht 5\' 2"  (1.575 m)   Wt 166 lb 3.2 oz (75.4 kg)   SpO2 97%   BMI 30.40 kg/m   Vitals:   04/07/24 0940  BP: (!) 140/88  Pulse: 66  Height: 5\' 2"  (1.575 m)  Weight: 166 lb 3.2 oz (75.4 kg)  SpO2: 97%  BMI (Calculated): 30.39    Physical Exam Vitals and nursing note reviewed.  Constitutional:      Appearance: Normal appearance.  HENT:     Head: Normocephalic and atraumatic.     Nose: Nose normal.     Mouth/Throat:     Mouth: Mucous membranes are moist.     Pharynx: Oropharynx is clear.  Eyes:     Conjunctiva/sclera: Conjunctivae normal.     Pupils: Pupils are equal, round, and reactive to light.  Cardiovascular:     Rate and Rhythm: Normal rate and regular rhythm.     Pulses: Normal pulses.     Heart sounds: Normal heart sounds. No murmur heard. Pulmonary:     Effort: Pulmonary effort is normal.     Breath sounds: Normal breath sounds. No wheezing.  Abdominal:     General: Bowel sounds are normal.     Palpations: Abdomen is soft.     Tenderness: There is no abdominal tenderness. There is no right CVA tenderness or left CVA tenderness.  Musculoskeletal:        General: Normal range of  motion.     Cervical back: Normal range of motion.     Right lower leg: No edema.     Left lower leg: No edema.  Skin:    General: Skin is warm and dry.  Neurological:     General: No focal deficit present.     Mental Status: She is alert and oriented to person, place, and time.  Psychiatric:        Mood and Affect: Mood normal.        Behavior: Behavior normal.      Results for orders placed or performed in visit on 04/07/24  POCT CBG (Fasting - Glucose)  Result Value Ref Range   Glucose Fasting, POC 128 (A) 70 - 99 mg/dL    Recent Results (from the past 2160 hours)  CUP PACEART REMOTE DEVICE CHECK     Status: None   Collection Time: 02/17/24  4:37 AM  Result Value Ref Range   Date Time Interrogation Session [id number]    Pulse Generator Manufacturer MERM    Pulse Gen Model [id number] Claria MRI Quad CRT-D    Pulse Gen Serial Number [id number]    Clinic Name [facility]    Implantable Pulse Generator Type Cardiac Resynch Therapy Defibulator    Implantable Pulse Generator Implant Date [id number]    Implantable Lead Manufacturer MERM    Implantable Lead Model 5076 CapSureFix Novus MRI SureScan    Implantable Lead Serial Number [id number]    Implantable Lead Implant Date 84132440    Implantable Lead Location Detail 1 APPENDAGE    Implantable Lead Location [id number]    Implantable Lead Connection Status [id number]    Implantable Lead Manufacturer [facility]    Implantable Lead Model 617 346 1795 Sprint Quattro Secure S MRI SureScan    Implantable Lead Serial Number [id number]    Implantable Lead Implant Date 53664403    Implantable Lead Location Detail 1 APEX    Implantable Lead  Location [id number]    Implantable Lead Connection Status [phone]    Implantable Lead Manufacturer [facility]    Implantable Lead Model 1458Q Quartet    Implantable Lead Serial Number [id number]    Implantable Lead Implant Date 82956213    Implantable Lead Location Detail 1 UNKNOWN    Implantable Lead Location [id number]     Implantable Lead Connection Status [phone]    Lead Channel Setting Sensing Sensitivity 0.3 mV   Lead Channel Setting Pacing Amplitude 2 V   Lead Channel Setting Pacing Pulse Width 0.4 ms   Lead Channel Setting Pacing Amplitude 2.5 V   Lead Channel Setting Pacing Pulse Width 0.8 ms   Lead Channel Setting Pacing Amplitude 3.5 V   Lead Channel Setting Pacing Capture Mode Adaptive Capture    Zone Setting Status Active    Zone Setting Status Inactive    Zone Setting Status Inactive    Zone Setting Status Inactive    Zone Setting Status [phone]    Lead Channel Impedance Value 380 ohm   Lead Channel Sensing Intrinsic Amplitude 2.75 mV   Lead Channel Sensing Intrinsic Amplitude 2.75 mV   Lead Channel Pacing Threshold Amplitude 0.5 V   Lead Channel Pacing Threshold Pulse Width 0.4 ms   Lead Channel Impedance Value 399 ohm   Lead Channel Impedance Value 380 ohm   Lead Channel Sensing Intrinsic Amplitude 9.75 mV   Lead Channel Sensing Intrinsic Amplitude 9.75 mV   Lead Channel Pacing Threshold Amplitude 0.75 V   Lead Channel Pacing Threshold Pulse Width 0.4 ms   HighPow Impedance 74 ohm   Lead Channel Impedance Value 988 ohm   Lead Channel Impedance Value 1,064 ohm   Lead Channel Impedance Value 1,007 ohm   Lead Channel Impedance Value 893 ohm   Lead Channel Impedance Value 874 ohm   Lead Channel Impedance Value 893 ohm   Lead Channel Impedance Value 646 ohm   Lead Channel Impedance Value 494 ohm   Lead Channel Impedance Value 551 ohm   Lead Channel Impedance Value 456 ohm   Lead Channel Impedance Value 279.933    Lead Channel Impedance Value 297.365    Lead Channel Impedance Value 267.31 ohm   Lead Channel Impedance Value 237.12 ohm   Lead Channel Impedance Value 249.509    Lead Channel Pacing Threshold Amplitude 3 V   Lead Channel Pacing Threshold Pulse Width 0.8 ms   Battery Status OK    Battery Remaining Longevity 13 mo   Battery Voltage 2.88 V   Brady Statistic RA Percent Paced  1.47 %   Brady Statistic RV Percent Paced 3.85 %   Brady Statistic AP VP Percent 1.41 %   Brady Statistic AS VP Percent 96.74 %   Brady Statistic AP VS Percent 0.06 %   Brady Statistic AS VS Percent 1.78 %  POCT CBG (Fasting - Glucose)     Status: Abnormal   Collection Time: 04/07/24  9:49 AM  Result Value Ref Range   Glucose Fasting, POC 128 (A) 70 - 99 mg/dL      Assessment & Plan:  Check labs today.Monitor BP. Derm referral. Problem List Items Addressed This Visit     Type 2 diabetes mellitus with hyperglycemia, without long-term current use of insulin (HCC)   Relevant Orders   POCT CBG (Fasting - Glucose) (Completed)   Hemoglobin A1c   Hypertension associated with diabetes (HCC) - Primary   Relevant Orders   CMP14+EGFR   Combined hyperlipidemia associated with type 2  diabetes mellitus (HCC)   Relevant Orders   Lipid Panel w/o Chol/HDL Ratio   Other Visit Diagnoses       Breast cancer screening by mammogram       Relevant Orders   MM 3D SCREENING MAMMOGRAM BILATERAL BREAST     Thrombocytopenia (HCC)       Relevant Orders   CBC with Diff     Skin lesion of right external ear       Relevant Orders   Ambulatory referral to Dermatology       Return in about 3 months (around 07/08/2024).   Total time spent: 30 minutes  Aisha Hove, MD  04/07/2024   This document may have been prepared by [facility] Voice Recognition software and as such may include unintentional dictation errors.

## 2024-04-08 LAB — CBC WITH DIFFERENTIAL/PLATELET
Basophils Absolute: 0 10*3/uL (ref 0.0–0.2)
Basos: 1 %
EOS (ABSOLUTE): 0.1 10*3/uL (ref 0.0–0.4)
Eos: 1 %
Hematocrit: 35.1 % (ref 34.0–46.6)
Hemoglobin: 11.4 g/dL (ref 11.1–15.9)
Immature Grans (Abs): 0 10*3/uL (ref 0.0–0.1)
Immature Granulocytes: 0 %
Lymphocytes Absolute: 3.3 10*3/uL — ABNORMAL HIGH (ref 0.7–3.1)
Lymphs: 49 %
MCH: 31.8 pg (ref 26.6–33.0)
MCHC: 32.5 g/dL (ref 31.5–35.7)
MCV: 98 fL — ABNORMAL HIGH (ref 79–97)
Monocytes Absolute: 0.2 10*3/uL (ref 0.1–0.9)
Monocytes: 4 %
Neutrophils Absolute: 3 10*3/uL (ref 1.4–7.0)
Neutrophils: 45 %
Platelets: 185 10*3/uL (ref 150–450)
RBC: 3.58 x10E6/uL — ABNORMAL LOW (ref 3.77–5.28)
RDW: 13.9 % (ref 11.7–15.4)
WBC: 6.6 10*3/uL (ref 3.4–10.8)

## 2024-04-08 LAB — CMP14+EGFR
ALT: 16 IU/L (ref 0–32)
AST: 21 IU/L (ref 0–40)
Albumin: 4.7 g/dL (ref 3.9–4.9)
Alkaline Phosphatase: 74 IU/L (ref 44–121)
BUN/Creatinine Ratio: 21 (ref 12–28)
BUN: 15 mg/dL (ref 8–27)
Bilirubin Total: 0.5 mg/dL (ref 0.0–1.2)
CO2: 22 mmol/L (ref 20–29)
Calcium: 9.5 mg/dL (ref 8.7–10.3)
Chloride: 104 mmol/L (ref 96–106)
Creatinine, Ser: 0.73 mg/dL (ref 0.57–1.00)
Globulin, Total: 2.3 g/dL (ref 1.5–4.5)
Glucose: 111 mg/dL — ABNORMAL HIGH (ref 70–99)
Potassium: 4.5 mmol/L (ref 3.5–5.2)
Sodium: 143 mmol/L (ref 134–144)
Total Protein: 7 g/dL (ref 6.0–8.5)
eGFR: 91 mL/min/{1.73_m2} (ref >59)

## 2024-04-08 LAB — HEMOGLOBIN A1C
Est. average glucose Bld gHb Est-mCnc: 137 mg/dL
Hgb A1c MFr Bld: 6.4 % — ABNORMAL HIGH (ref 4.8–5.6)

## 2024-04-08 LAB — LIPID PANEL W/O CHOL/HDL RATIO
Cholesterol, Total: 128 mg/dL (ref 100–199)
HDL: 58 mg/dL
LDL Chol Calc (NIH): 53 mg/dL (ref 0–99)
Triglycerides: 91 mg/dL (ref 0–149)
VLDL Cholesterol Cal: 17 mg/dL (ref 5–40)

## 2024-04-13 ENCOUNTER — Encounter

## 2024-04-14 DIAGNOSIS — H43391 Other vitreous opacities, right eye: Secondary | ICD-10-CM | POA: Diagnosis not present

## 2024-04-15 ENCOUNTER — Telehealth: Payer: Self-pay

## 2024-04-15 NOTE — Telephone Encounter (Signed)
 Attempted ICM call to patient in response to mychart message.  Pt currently stays as her fathers house 3 nights a week and unable to have device monitor with her.  Advised to call back to discuss if there are select days we may be to obtain scheduled remote transmissions.

## 2024-04-15 NOTE — Progress Notes (Signed)
 No ICM remote transmission received for 04/13/2024 and next ICM transmission scheduled for 06/03/2024.

## 2024-04-17 ENCOUNTER — Ambulatory Visit: Attending: Cardiology

## 2024-04-17 DIAGNOSIS — I5022 Chronic systolic (congestive) heart failure: Secondary | ICD-10-CM | POA: Diagnosis not present

## 2024-04-17 DIAGNOSIS — Z95810 Presence of automatic (implantable) cardiac defibrillator: Secondary | ICD-10-CM

## 2024-04-17 NOTE — Progress Notes (Signed)
 EPIC Encounter for ICM Monitoring  Patient Name: Margaret Hendricks is a 68 y.o. female Date: 04/17/2024 Primary Care Physican: Aisha Hove, MD Primary Cardiologist:  Daneil Dunker Electrophysiologist: Gareth Junes Pacing:  98.1%                 11/20/2022 Weight: 174 lbs 11/27/2022 Weight: 168 lbs 06/27/2023 Office Weight: 172 lbs 11/26/2023 Weight: 172 lbs  12/04/2023 Weight: 168 lbs   Time in AT/AF    0.0 hr/day (0.0%)         Transmission results reviewed and results sent via mychart.  She responded via mychart and is staying with her father on Sunday and Monday nights and has some days shifts during the week. She is unable to have monitor at her fathers house.   She has a lot of stress at this time.  Discussed what may cause fluid retention and encouraged to limit salt and fluid intake.    Optivol thoracic impedance suggesting normal fluid levels with the exception of possible fluid accumulation from 6/4-6/17.   Prescribed:  Furosemide  20 mg 1 tablet daily Spironolactone  25 mg take 1 tablet daily   Labs: 10/03/2023 Creatinine 0.86, BUN 19, Potassium 4.8, Sodium 139, GFR 75  06/18/2023 Creatinine 0.76, BUN 18, Potassium 4.4, Sodium 141, GFR 87  02/25/2023 Creatinine 0.84, BUN 12, Potassium 4.8, Sodium 138, GFR 78  A complete set of results can be found in Results Review.   Recommendations:  No changes and encouraged to call if experiencing any fluid symptoms.   Follow-up plan: ICM clinic phone appointment on 06/03/2024.   91 day device clinic remote transmission 05/18/2024.     EP/Cardiology Office Visits:   EP scheduler number sent via mychart to schedule yearly appt.    Last EP office visit 03/07/2023 (needs to schedule yearly visit, no recall)       Copy of ICM check sent to Dr. Daneil Dunker.    3 month ICM trend: 04/15/2024.    12-14 Month ICM trend:     Almyra Jain, RN 04/17/2024 4:41 PM

## 2024-04-23 ENCOUNTER — Other Ambulatory Visit: Payer: Self-pay | Admitting: Internal Medicine

## 2024-05-18 ENCOUNTER — Ambulatory Visit: Payer: Medicare HMO

## 2024-05-18 DIAGNOSIS — I5022 Chronic systolic (congestive) heart failure: Secondary | ICD-10-CM

## 2024-05-20 ENCOUNTER — Ambulatory Visit: Attending: Cardiology

## 2024-05-20 DIAGNOSIS — I5022 Chronic systolic (congestive) heart failure: Secondary | ICD-10-CM

## 2024-05-20 DIAGNOSIS — Z95810 Presence of automatic (implantable) cardiac defibrillator: Secondary | ICD-10-CM | POA: Diagnosis not present

## 2024-05-21 NOTE — Progress Notes (Signed)
 EPIC Encounter for ICM Monitoring  Patient Name: Margaret Hendricks is a 68 y.o. female Date: 05/21/2024 Primary Care Physican: Margaret Fredy RAMAN, MD Primary Cardiologist:  Margaret Hendricks: Margaret Hendricks Pacing:  98.0%                 11/20/2022 Weight: 174 lbs 11/27/2022 Weight: 168 lbs 06/27/2023 Office Weight: 172 lbs 11/26/2023 Weight: 172 lbs  12/04/2023 Weight: 168 lbs   Time in AT/AF    0.0 hr/day (0.0%)         Transmission results reviewed.   Optivol thoracic impedance suggesting intermittent days with possible fluid accumulation within the last month.   Prescribed:  Furosemide  20 mg 1 tablet daily Spironolactone  25 mg take 1 tablet daily   Labs: 10/03/2023 Creatinine 0.86, BUN 19, Potassium 4.8, Sodium 139, GFR 75  06/18/2023 Creatinine 0.76, BUN 18, Potassium 4.4, Sodium 141, GFR 87  02/25/2023 Creatinine 0.84, BUN 12, Potassium 4.8, Sodium 138, GFR 78  A complete set of results can be found in Results Review.   Recommendations:  No changes.   Follow-up plan: ICM clinic phone appointment on 06/24/2024.   91 day device clinic remote transmission 08/17/2024.     EP/Cardiology Office Visits:   EP scheduler number sent via mychart to schedule yearly appt.    Last EP office visit 03/07/2023 (needs to schedule yearly visit, no recall)       Copy of ICM check sent to Margaret Hendricks.    3 month ICM trend: 05/19/2024.    12-14 Month ICM trend:     Margaret Hendricks Garner, RN 05/21/2024 5:21 PM

## 2024-05-22 LAB — CUP PACEART REMOTE DEVICE CHECK
Battery Remaining Longevity: 11 mo
Battery Voltage: 2.86 V
Brady Statistic AP VP Percent: 1.12 %
Brady Statistic AP VS Percent: 0.05 %
Brady Statistic AS VP Percent: 97.03 %
Brady Statistic AS VS Percent: 1.8 %
Brady Statistic RA Percent Paced: 1.16 %
Brady Statistic RV Percent Paced: 2.48 %
Date Time Interrogation Session: 20250722213627
HighPow Impedance: 72 Ohm
Implantable Lead Connection Status: 753985
Implantable Lead Connection Status: 753985
Implantable Lead Connection Status: 753985
Implantable Lead Implant Date: 20170918
Implantable Lead Implant Date: 20170918
Implantable Lead Implant Date: 20170918
Implantable Lead Location: 753858
Implantable Lead Location: 753859
Implantable Lead Location: 753860
Implantable Lead Model: 5076
Implantable Pulse Generator Implant Date: 20170918
Lead Channel Impedance Value: 1045 Ohm
Lead Channel Impedance Value: 1045 Ohm
Lead Channel Impedance Value: 1121 Ohm
Lead Channel Impedance Value: 275.5 Ohm
Lead Channel Impedance Value: 289.049
Lead Channel Impedance Value: 289.049
Lead Channel Impedance Value: 301.328
Lead Channel Impedance Value: 317.612
Lead Channel Impedance Value: 342 Ohm
Lead Channel Impedance Value: 437 Ohm
Lead Channel Impedance Value: 456 Ohm
Lead Channel Impedance Value: 551 Ohm
Lead Channel Impedance Value: 551 Ohm
Lead Channel Impedance Value: 608 Ohm
Lead Channel Impedance Value: 665 Ohm
Lead Channel Impedance Value: 988 Ohm
Lead Channel Impedance Value: 988 Ohm
Lead Channel Impedance Value: 988 Ohm
Lead Channel Pacing Threshold Amplitude: 0.5 V
Lead Channel Pacing Threshold Amplitude: 0.75 V
Lead Channel Pacing Threshold Amplitude: 2.75 V
Lead Channel Pacing Threshold Pulse Width: 0.4 ms
Lead Channel Pacing Threshold Pulse Width: 0.4 ms
Lead Channel Pacing Threshold Pulse Width: 0.8 ms
Lead Channel Sensing Intrinsic Amplitude: 3 mV
Lead Channel Sensing Intrinsic Amplitude: 3 mV
Lead Channel Sensing Intrinsic Amplitude: 8 mV
Lead Channel Sensing Intrinsic Amplitude: 8 mV
Lead Channel Setting Pacing Amplitude: 2 V
Lead Channel Setting Pacing Amplitude: 2.5 V
Lead Channel Setting Pacing Amplitude: 3.25 V
Lead Channel Setting Pacing Pulse Width: 0.4 ms
Lead Channel Setting Pacing Pulse Width: 0.8 ms
Lead Channel Setting Sensing Sensitivity: 0.3 mV
Zone Setting Status: 755011

## 2024-05-26 ENCOUNTER — Other Ambulatory Visit: Payer: Self-pay

## 2024-05-26 ENCOUNTER — Encounter: Payer: Self-pay | Admitting: Cardiovascular Disease

## 2024-05-27 MED ORDER — SACUBITRIL-VALSARTAN 97-103 MG PO TABS: 1.0000 | ORAL_TABLET | Freq: Two times a day (BID) | ORAL | 2 refills | Status: DC

## 2024-06-03 ENCOUNTER — Encounter

## 2024-06-07 ENCOUNTER — Other Ambulatory Visit: Payer: Self-pay | Admitting: Internal Medicine

## 2024-06-07 DIAGNOSIS — K219 Gastro-esophageal reflux disease without esophagitis: Secondary | ICD-10-CM

## 2024-06-24 ENCOUNTER — Ambulatory Visit

## 2024-06-24 NOTE — Progress Notes (Signed)
 No ICM remote transmission received for 06/22/2024 and next ICM transmission scheduled for 07/14/2024.

## 2024-06-25 ENCOUNTER — Ambulatory Visit: Attending: Cardiology

## 2024-06-25 DIAGNOSIS — I5022 Chronic systolic (congestive) heart failure: Secondary | ICD-10-CM | POA: Diagnosis not present

## 2024-06-25 DIAGNOSIS — Z95810 Presence of automatic (implantable) cardiac defibrillator: Secondary | ICD-10-CM

## 2024-06-25 NOTE — Progress Notes (Signed)
 EPIC Encounter for ICM Monitoring  Patient Name: Margaret Hendricks is a 68 y.o. female Date: 06/25/2024 Primary Care Physican: Fernand Fredy RAMAN, MD Primary Cardiologist:  Kennyth Electrophysiologist: Kennyth Pore Pacing:  97.9%                 11/20/2022 Weight: 174 lbs 11/27/2022 Weight: 168 lbs 06/27/2023 Office Weight: 172 lbs 11/26/2023 Weight: 172 lbs  12/04/2023 Weight: 168 lbs   Time in AT/AF    0.0 hr/day (0.0%)         Spoke with patient and heart failure questions reviewed.  Transmission results reviewed.  Pt asymptomatic for fluid accumulation.  Reports feeling well at this time and voices no complaints.   Father recently passed away   Optivol thoracic impedance suggesting intermittent days with possible fluid accumulation within the last month.   Prescribed:  Furosemide  20 mg 1 tablet daily Spironolactone  25 mg take 1 tablet daily   Labs: 10/03/2023 Creatinine 0.86, BUN 19, Potassium 4.8, Sodium 139, GFR 75  06/18/2023 Creatinine 0.76, BUN 18, Potassium 4.4, Sodium 141, GFR 87  02/25/2023 Creatinine 0.84, BUN 12, Potassium 4.8, Sodium 138, GFR 78  A complete set of results can be found in Results Review.   Recommendations:  No changes and encouraged to call if experiencing any fluid symptoms.   Follow-up plan: ICM clinic phone appointment on 07/27/2024.   91 day device clinic remote transmission 08/17/2024.     EP/Cardiology Office Visits:   Provided EP scheduler number to schedule yearly appt.    Last EP office visit 03/07/2023 (needs to schedule yearly visit, no recall)       Copy of ICM check sent to Dr. Kennyth.    3 month ICM trend: 06/22/2024.    12-14 Month ICM trend:     [name], RN 06/25/2024 5:00 PM

## 2024-06-30 ENCOUNTER — Encounter: Payer: Self-pay | Admitting: Internal Medicine

## 2024-07-05 ENCOUNTER — Other Ambulatory Visit: Payer: Self-pay | Admitting: Internal Medicine

## 2024-07-14 ENCOUNTER — Encounter

## 2024-07-15 ENCOUNTER — Other Ambulatory Visit: Payer: Self-pay | Admitting: Internal Medicine

## 2024-07-15 DIAGNOSIS — E119 Type 2 diabetes mellitus without complications: Secondary | ICD-10-CM

## 2024-07-16 ENCOUNTER — Ambulatory Visit: Admitting: Internal Medicine

## 2024-07-27 ENCOUNTER — Ambulatory Visit: Attending: Cardiology

## 2024-07-27 DIAGNOSIS — I5022 Chronic systolic (congestive) heart failure: Secondary | ICD-10-CM | POA: Diagnosis not present

## 2024-07-27 DIAGNOSIS — Z95810 Presence of automatic (implantable) cardiac defibrillator: Secondary | ICD-10-CM

## 2024-07-27 NOTE — Progress Notes (Signed)
 EPIC Encounter for ICM Monitoring  Patient Name: Nan Maya is a 68 y.o. female Date: 07/27/2024 Primary Care Physican: Fernand Fredy RAMAN, MD Primary Cardiologist:  Kennyth Electrophysiologist: Kennyth Pore Pacing:  98%                 11/20/2022 Weight: 174 lbs 11/27/2022 Weight: 168 lbs 06/27/2023 Office Weight: 172 lbs 11/26/2023 Weight: 172 lbs  12/04/2023 Weight: 168 lbs   Time in AT/AF    0.0 hr/day (0.0%)         Attempted call to patient and unable to reach.  Left detailed message per DPR regarding transmission.  Transmission results reviewed.    Since 06/22/2024 ICM Remote transmission, Optivol thoracic impedance suggesting normal fluid levels but slightly trending below baseline 9/25.   Prescribed:  Furosemide  20 mg 1 tablet daily Spironolactone  25 mg take 1 tablet daily   Labs: 10/03/2023 Creatinine 0.86, BUN 19, Potassium 4.8, Sodium 139, GFR 75  06/18/2023 Creatinine 0.76, BUN 18, Potassium 4.4, Sodium 141, GFR 87  02/25/2023 Creatinine 0.84, BUN 12, Potassium 4.8, Sodium 138, GFR 78  A complete set of results can be found in Results Review.   Recommendations:  Left voice mail with ICM number and encouraged to call if experiencing any fluid symptoms.  Left EP scheduler number and encouraged to call for yearly office visit.    Follow-up plan: ICM clinic phone appointment on 08/31/2024.   91 day device clinic remote transmission 08/17/2024.     EP/Cardiology Office Visits:     Last EP office visit 03/07/2023 (needs to schedule yearly visit, no recall)       Copy of ICM check sent to Dr. Kennyth.  Remote monitoring is medically necessary for Heart Failure Management.    90 day Daily Thoracic Impedance ICM trend: 04/27/2024 through 07/27/2024.    12-14 Month Thoracic Impedance ICM trend:     [name], RN 07/27/2024 2:42 PM

## 2024-08-03 NOTE — Progress Notes (Signed)
 Remote ICD Transmission

## 2024-08-08 ENCOUNTER — Other Ambulatory Visit: Payer: Self-pay | Admitting: Cardiology

## 2024-08-10 ENCOUNTER — Ambulatory Visit (INDEPENDENT_AMBULATORY_CARE_PROVIDER_SITE_OTHER): Admitting: Internal Medicine

## 2024-08-10 ENCOUNTER — Encounter: Payer: Self-pay | Admitting: Internal Medicine

## 2024-08-10 VITALS — BP 120/78 | HR 84 | Ht 62.0 in | Wt 168.0 lb

## 2024-08-10 DIAGNOSIS — E1165 Type 2 diabetes mellitus with hyperglycemia: Secondary | ICD-10-CM | POA: Diagnosis not present

## 2024-08-10 DIAGNOSIS — E782 Mixed hyperlipidemia: Secondary | ICD-10-CM

## 2024-08-10 DIAGNOSIS — Z95811 Presence of heart assist device: Secondary | ICD-10-CM | POA: Insufficient documentation

## 2024-08-10 DIAGNOSIS — I152 Hypertension secondary to endocrine disorders: Secondary | ICD-10-CM

## 2024-08-10 DIAGNOSIS — D696 Thrombocytopenia, unspecified: Secondary | ICD-10-CM | POA: Insufficient documentation

## 2024-08-10 DIAGNOSIS — Z860101 Personal history of adenomatous and serrated colon polyps: Secondary | ICD-10-CM | POA: Diagnosis not present

## 2024-08-10 DIAGNOSIS — Z23 Encounter for immunization: Secondary | ICD-10-CM

## 2024-08-10 DIAGNOSIS — E1159 Type 2 diabetes mellitus with other circulatory complications: Secondary | ICD-10-CM | POA: Diagnosis not present

## 2024-08-10 DIAGNOSIS — E1169 Type 2 diabetes mellitus with other specified complication: Secondary | ICD-10-CM | POA: Diagnosis not present

## 2024-08-10 DIAGNOSIS — J449 Chronic obstructive pulmonary disease, unspecified: Secondary | ICD-10-CM | POA: Diagnosis not present

## 2024-08-10 LAB — POCT CBG (FASTING - GLUCOSE)-MANUAL ENTRY: Glucose Fasting, POC: 120 mg/dL — AB (ref 70–99)

## 2024-08-10 NOTE — Progress Notes (Signed)
 Established Patient Office Visit  Subjective:  Patient ID: Margaret Hendricks, female    DOB: 02-25-1956  Age: 68 y.o. MRN: [id number]  Chief Complaint  Patient presents with   Follow-up    Patient comes in for follow up. Admits to being under stress at home, did not get mammogram yet, and also did not go for Derm appointment. The lesion on back of right ear healed , but now its on left side- related to her eyeglass frames- advised to cushion it- Also needs follow up colonoscopy , has personal h/x of Tubular Adenoma. Will get flu shot today and labs.    No other concerns at this time.   Past Medical History:  Diagnosis Date   Abnormal heart rhythm    AICD (automatic cardioverter/defibrillator) present    Anemia    Cancer (HCC)    skin   Chronic systolic CHF (congestive heart failure), NYHA class 2- 3 (HCC) 07/03/2016   COPD (chronic obstructive pulmonary disease) (HCC)    Diabetes mellitus without complication (HCC)    History of colonic polyps    LBBB (left bundle branch block) 07/03/2016   NICM (nonischemic cardiomyopathy) (HCC) 07/03/2016   Shortness of breath     Past Surgical History:  Procedure Laterality Date   APPENDECTOMY     CARDIAC CATHETERIZATION Left 03/14/2015   Procedure: Left Heart Cath;  Surgeon: Denyse DELENA Bathe, MD;  Location: [facility] INVASIVE CV LAB;  Service: Cardiovascular;  Laterality: Left;   COLONOSCOPY WITH PROPOFOL  N/A 05/12/2018   Procedure: COLONOSCOPY WITH PROPOFOL ;  Surgeon: Gaylyn Gladis PENNER, MD;  Location: [facility] ENDOSCOPY;  Service: Endoscopy;  Laterality: N/A;   EP IMPLANTABLE DEVICE N/A 07/16/2016   Procedure: BiV ICD Insertion CRT-D;  Surgeon: Elspeth JAYSON Sage, MD;  Location: [facility] INVASIVE CV LAB;  Service: Cardiovascular;  Laterality: N/A;   ESOPHAGOGASTRODUODENOSCOPY (EGD) WITH PROPOFOL  N/A 05/12/2018   Procedure: ESOPHAGOGASTRODUODENOSCOPY (EGD) WITH PROPOFOL ;  Surgeon: Gaylyn Gladis PENNER, MD;  Location: [facility] ENDOSCOPY;  Service: Endoscopy;   Laterality: N/A;   ICD IMPLANT     INSERT / REPLACE / REMOVE PACEMAKER     TUBAL LIGATION Bilateral     Social History   Socioeconomic History   Marital status: Single    Spouse name: Not on file   Number of children: Not on file   Years of education: Not on file   Highest education level: Not on file  Occupational History   Not on file  Tobacco Use   Smoking status: Former    Current packs/day: 0.00    Average packs/day: 0.1 packs/day for 30.0 years (3.0 ttl pk-yrs)    Types: Cigarettes    Quit date: 2021    Years since quitting: 4.7   Smokeless tobacco: Never   Tobacco comments:    Cynithia admits she smokes 1/2 cigarette in am on the way to work and 1/2 cigarette on the way home from work.   Vaping Use   Vaping status: Never Used  Substance and Sexual Activity   Alcohol use: No    Alcohol/week: 0.0 standard drinks of alcohol   Drug use: Never    Types: Marijuana   Sexual activity: Not on file  Other Topics Concern   Not on file  Social History Narrative   Not on file   Social Drivers of Health   Financial Resource Strain: Low Risk  (09/30/2023)   Overall Financial Resource Strain (CARDIA)    Difficulty of Paying Living Expenses: Not hard at all  Food Insecurity: No Food Insecurity (09/30/2023)   Hunger Vital Sign    Worried About Running Out of Food in the Last Year: Never true    Ran Out of Food in the Last Year: Never true  Transportation Needs: No Transportation Needs (09/30/2023)   PRAPARE - Administrator, Civil Service (Medical): No    Lack of Transportation (Non-Medical): No  Physical Activity: Not on file  Stress: No Stress Concern Present (09/30/2023)   [employer] of Occupational Health - Occupational Stress Questionnaire    Feeling of Stress : Only a little  Social Connections: Not on file  Intimate Partner Violence: Not on file    Family History  Problem Relation Age of Onset   Hypertension Other    Heart attack Other     Breast cancer Neg Hx     Allergies  Allergen Reactions   Tape Other (See Comments)    Adhesive-silicones    Outpatient Medications Prior to Visit  Medication Sig   amitriptyline (ELAVIL) 10 MG tablet TAKE 1 TABLET AT BEDTIME   atorvastatin (LIPITOR) 80 MG tablet TAKE 1 TABLET EVERY DAY   baclofen  (LIORESAL ) 10 MG tablet Take 1 tablet (10 mg total) by mouth daily.   calcium carbonate (OSCAL) 1500 (600 Ca) MG TABS tablet Take by mouth 2 (two) times daily with a meal.   celecoxib (CELEBREX) 200 MG capsule TAKE 1 CAPSULE EVERY DAY   cholecalciferol (VITAMIN D) 1000 units tablet Take 1,000 Units by mouth daily.   Coenzyme Q10 (COQ-10) 30 MG CAPS Take by mouth.   ferrous sulfate 325 (65 FE) MG tablet Take 325 mg by mouth 2 (two) times daily with a meal.   furosemide  (LASIX ) 20 MG tablet TAKE 1 TABLET EVERY DAY (NEED MD APPOINTMENT)   metFORMIN (GLUCOPHAGE) 500 MG tablet TAKE 1 TABLET EVERY DAY   pantoprazole  (PROTONIX ) 40 MG tablet TAKE 1 TABLET EVERY DAY   sertraline (ZOLOFT) 50 MG tablet TAKE 1 TABLET EVERY DAY (NEED MD APPOINTMENT)   spironolactone  (ALDACTONE ) 25 MG tablet Take 25 mg by mouth daily.   [DISCONTINUED] sacubitril -valsartan  (ENTRESTO ) 97-103 MG Take 1 tablet by mouth 2 (two) times daily.   carvedilol  (COREG ) 12.5 MG tablet TAKE 1 TABLET TWICE DAILY WITH MEALS (PLEASE SCHEDULE AN APPOINTMENT WITH CARDIOLOGY FOR FUTURE REFILLS) (Patient not taking: Reported on 08/10/2024)   Multiple Vitamins-Minerals (ICAPS AREDS FORMULA PO) Take 1 tablet by mouth daily. (Patient not taking: Reported on 08/10/2024)   mupirocin  ointment (BACTROBAN ) 2 % Apply bid to the nares (Patient not taking: Reported on 08/10/2024)   No facility-administered medications prior to visit.    Review of Systems  Constitutional: Negative.  Negative for chills, fever and malaise/fatigue.  HENT: Negative.  Negative for congestion and sore throat.   Eyes: Negative.  Negative for blurred vision and pain.   Respiratory: Negative.  Negative for cough and shortness of breath.   Cardiovascular: Negative.  Negative for chest pain, palpitations and leg swelling.  Gastrointestinal: Negative.  Negative for abdominal pain, blood in stool, constipation, diarrhea, heartburn, melena, nausea and vomiting.  Genitourinary: Negative.  Negative for dysuria, flank pain, frequency and urgency.  Musculoskeletal: Negative.  Negative for joint pain and myalgias.  Skin: Negative.   Neurological: Negative.  Negative for dizziness, tingling, sensory change, weakness and headaches.  Endo/Heme/Allergies: Negative.   Psychiatric/Behavioral: Negative.  Negative for depression and suicidal ideas. The patient is not nervous/anxious.        Objective:   BP 120/78  Pulse 84   Ht 5' 2 (1.575 m)   Wt 168 lb (76.2 kg)   SpO2 95%   BMI 30.73 kg/m   Vitals:   08/10/24 0911  BP: 120/78  Pulse: 84  Height: 5' 2 (1.575 m)  Weight: 168 lb (76.2 kg)  SpO2: 95%  BMI (Calculated): 30.72    Physical Exam Vitals and nursing note reviewed.  Constitutional:      Appearance: Normal appearance.  HENT:     Head: Normocephalic and atraumatic.     Nose: Nose normal.     Mouth/Throat:     Mouth: Mucous membranes are moist.     Pharynx: Oropharynx is clear.  Eyes:     Conjunctiva/sclera: Conjunctivae normal.     Pupils: Pupils are equal, round, and reactive to light.  Cardiovascular:     Rate and Rhythm: Normal rate and regular rhythm.     Pulses: Normal pulses.     Heart sounds: Normal heart sounds. No murmur heard. Pulmonary:     Effort: Pulmonary effort is normal.     Breath sounds: Normal breath sounds. No wheezing.  Abdominal:     General: Bowel sounds are normal.     Palpations: Abdomen is soft.     Tenderness: There is no abdominal tenderness. There is no right CVA tenderness or left CVA tenderness.  Musculoskeletal:        General: Normal range of motion.     Cervical back: Normal range of motion.      Right lower leg: No edema.     Left lower leg: No edema.  Skin:    General: Skin is warm and dry.  Neurological:     General: No focal deficit present.     Mental Status: She is alert and oriented to person, place, and time.  Psychiatric:        Mood and Affect: Mood normal.        Behavior: Behavior normal.      Results for orders placed or performed in visit on 08/10/24  POCT CBG (Fasting - Glucose)  Result Value Ref Range   Glucose Fasting, POC 120 (A) 70 - 99 mg/dL    Recent Results (from the past 2160 hours)  CUP PACEART REMOTE DEVICE CHECK     Status: None   Collection Time: 05/19/24  9:36 PM  Result Value Ref Range   Date Time Interrogation Session [id number]    Pulse Generator Manufacturer MERM    Pulse Gen Model [id number] Claria MRI Quad CRT-D    Pulse Gen Serial Number [id number]    Clinic Name [facility]    Implantable Pulse Generator Type Cardiac Resynch Therapy Defibulator    Implantable Pulse Generator Implant Date 79829081    Implantable Lead Manufacturer MERM    Implantable Lead Model 5076 CapSureFix Novus MRI SureScan    Implantable Lead Serial Number [id number]    Implantable Lead Implant Date 79829081    Implantable Lead Location Detail 1 APPENDAGE    Implantable Lead Location [id number]    Implantable Lead Connection Status [id number]    Implantable Lead Manufacturer [facility]    Implantable Lead Model [id number] Sprint Quattro Secure S MRI SureScan    Implantable Lead Serial Number [id number]    Implantable Lead Implant Date 79829081    Implantable Lead Location Detail 1 APEX    Implantable Lead Location [id number]    Implantable Lead Connection Status [id number]    Implantable Lead Manufacturer [facility]    Implantable Lead Model [id number]  Quartet    Implantable Lead Serial Number [id number]    Implantable Lead Implant Date 79829081    Implantable Lead Location Detail 1 UNKNOWN    Implantable Lead Location [id number]    Implantable Lead Connection Status [id number]    Lead Channel  Setting Sensing Sensitivity 0.3 mV   Lead Channel Setting Pacing Amplitude 2 V   Lead Channel Setting Pacing Pulse Width 0.4 ms   Lead Channel Setting Pacing Amplitude 2.5 V   Lead Channel Setting Pacing Pulse Width 0.8 ms   Lead Channel Setting Pacing Amplitude 3.25 V   Lead Channel Setting Pacing Capture Mode Adaptive Capture    Zone Setting Status Active    Zone Setting Status Inactive    Zone Setting Status Inactive    Zone Setting Status Inactive    Zone Setting Status [id number]    Lead Channel Impedance Value 437 ohm   Lead Channel Sensing Intrinsic Amplitude 3 mV   Lead Channel Sensing Intrinsic Amplitude 3 mV   Lead Channel Pacing Threshold Amplitude 0.5 V   Lead Channel Pacing Threshold Pulse Width 0.4 ms   Lead Channel Impedance Value 456 ohm   Lead Channel Impedance Value 342 ohm   Lead Channel Sensing Intrinsic Amplitude 8 mV   Lead Channel Sensing Intrinsic Amplitude 8 mV   Lead Channel Pacing Threshold Amplitude 0.75 V   Lead Channel Pacing Threshold Pulse Width 0.4 ms   HighPow Impedance 72 ohm   Lead Channel Impedance Value 988 ohm   Lead Channel Impedance Value 1,121 ohm   Lead Channel Impedance Value 1,045 ohm   Lead Channel Impedance Value 988 ohm   Lead Channel Impedance Value 988 ohm   Lead Channel Impedance Value 1,045 ohm   Lead Channel Impedance Value 608 ohm   Lead Channel Impedance Value 551 ohm   Lead Channel Impedance Value 665 ohm   Lead Channel Impedance Value 551 ohm   Lead Channel Impedance Value 289.049    Lead Channel Impedance Value 317.612    Lead Channel Impedance Value 289.049    Lead Channel Impedance Value 275.5 ohm   Lead Channel Impedance Value 301.328    Lead Channel Pacing Threshold Amplitude 2.75 V   Lead Channel Pacing Threshold Pulse Width 0.8 ms   Battery Status OK    Battery Remaining Longevity 11 mo   Battery Voltage 2.86 V   Brady Statistic RA Percent Paced 1.16 %   Brady Statistic RV Percent Paced 2.48 %   Brady Statistic  AP VP Percent 1.12 %   Brady Statistic AS VP Percent 97.03 %   Brady Statistic AP VS Percent 0.05 %   Brady Statistic AS VS Percent 1.8 %  POCT CBG (Fasting - Glucose)     Status: Abnormal   Collection Time: 08/10/24  9:16 AM  Result Value Ref Range   Glucose Fasting, POC 120 (A) 70 - 99 mg/dL      Assessment & Plan:  Patient will get her mammogram scheduled. Labs toady. GI referral sent. Problem List Items Addressed This Visit     COPD, moderate (HCC) (Chronic)   Type 2 diabetes mellitus with hyperglycemia, without long-term current use of insulin (HCC) - Primary   Relevant Orders   POCT CBG (Fasting - Glucose) (Completed)   Hemoglobin A1c   Hypertension associated with diabetes (HCC)   Relevant Orders   CMP14+EGFR   Combined hyperlipidemia associated with type 2 diabetes mellitus (HCC)   Relevant Orders   Lipid Panel w/o Chol/HDL  Ratio   Presence of heart assist device [facility])   Personal history of adenomatous and serrated colon polyps   Relevant Orders   Ambulatory referral to Gastroenterology   Thrombocytopenia   Relevant Orders   CBC with Diff   Other Visit Diagnoses       Flu vaccine need       Relevant Orders   Flu Vaccine Trivalent High Dose (Fluad) (Completed)       Return in about 3 months (around 11/10/2024).   Total time spent: 30 minutes  FERNAND FREDY RAMAN, MD  08/10/2024   This document may have been prepared by [facility] Voice Recognition software and as such may include unintentional dictation errors.

## 2024-08-11 ENCOUNTER — Ambulatory Visit: Payer: Self-pay | Admitting: Internal Medicine

## 2024-08-11 LAB — CMP14+EGFR
ALT: 13 IU/L (ref 0–32)
AST: 18 IU/L (ref 0–40)
Albumin: 4.5 g/dL (ref 3.9–4.9)
Alkaline Phosphatase: 77 IU/L (ref 49–135)
BUN/Creatinine Ratio: 16 (ref 12–28)
BUN: 12 mg/dL (ref 8–27)
Bilirubin Total: 0.5 mg/dL (ref 0.0–1.2)
CO2: 21 mmol/L (ref 20–29)
Calcium: 9.2 mg/dL (ref 8.7–10.3)
Chloride: 102 mmol/L (ref 96–106)
Creatinine, Ser: 0.73 mg/dL (ref 0.57–1.00)
Globulin, Total: 2.5 g/dL (ref 1.5–4.5)
Glucose: 104 mg/dL — ABNORMAL HIGH (ref 70–99)
Potassium: 4.6 mmol/L (ref 3.5–5.2)
Sodium: 139 mmol/L (ref 134–144)
Total Protein: 7 g/dL (ref 6.0–8.5)
eGFR: 91 mL/min/1.73 (ref >59)

## 2024-08-11 LAB — CBC WITH DIFFERENTIAL/PLATELET
Basophils Absolute: 0 x10E3/uL (ref 0.0–0.2)
Basos: 1 %
EOS (ABSOLUTE): 0.1 x10E3/uL (ref 0.0–0.4)
Eos: 2 %
Hematocrit: 35.7 % (ref 34.0–46.6)
Hemoglobin: 11.3 g/dL (ref 11.1–15.9)
Immature Grans (Abs): 0 x10E3/uL (ref 0.0–0.1)
Immature Granulocytes: 0 %
Lymphocytes Absolute: 2.9 x10E3/uL (ref 0.7–3.1)
Lymphs: 51 %
MCH: 31 pg (ref 26.6–33.0)
MCHC: 31.7 g/dL (ref 31.5–35.7)
MCV: 98 fL — ABNORMAL HIGH (ref 79–97)
Monocytes Absolute: 0.3 x10E3/uL (ref 0.1–0.9)
Monocytes: 4 %
Neutrophils Absolute: 2.4 x10E3/uL (ref 1.4–7.0)
Neutrophils: 42 %
Platelets: 160 x10E3/uL (ref 150–450)
RBC: 3.64 x10E6/uL — ABNORMAL LOW (ref 3.77–5.28)
RDW: 13.3 % (ref 11.7–15.4)
WBC: 5.7 x10E3/uL (ref 3.4–10.8)

## 2024-08-11 LAB — LIPID PANEL W/O CHOL/HDL RATIO
Cholesterol, Total: 127 mg/dL (ref 100–199)
HDL: 62 mg/dL (ref >39)
LDL Chol Calc (NIH): 48 mg/dL (ref 0–99)
Triglycerides: 87 mg/dL (ref 0–149)
VLDL Cholesterol Cal: 17 mg/dL (ref 5–40)

## 2024-08-11 LAB — HEMOGLOBIN A1C
Est. average glucose Bld gHb Est-mCnc: 128 mg/dL
Hgb A1c MFr Bld: 6.1 % — ABNORMAL HIGH (ref 4.8–5.6)

## 2024-08-12 NOTE — Progress Notes (Signed)
 Patient notified

## 2024-08-13 ENCOUNTER — Ambulatory Visit (INDEPENDENT_AMBULATORY_CARE_PROVIDER_SITE_OTHER): Admitting: Cardiovascular Disease

## 2024-08-13 ENCOUNTER — Encounter: Payer: Self-pay | Admitting: Cardiovascular Disease

## 2024-08-13 VITALS — BP 128/62 | HR 65 | Ht 62.0 in | Wt 167.0 lb

## 2024-08-13 DIAGNOSIS — I5022 Chronic systolic (congestive) heart failure: Secondary | ICD-10-CM

## 2024-08-13 DIAGNOSIS — E782 Mixed hyperlipidemia: Secondary | ICD-10-CM | POA: Diagnosis not present

## 2024-08-13 DIAGNOSIS — I152 Hypertension secondary to endocrine disorders: Secondary | ICD-10-CM

## 2024-08-13 DIAGNOSIS — E1165 Type 2 diabetes mellitus with hyperglycemia: Secondary | ICD-10-CM

## 2024-08-13 DIAGNOSIS — E1169 Type 2 diabetes mellitus with other specified complication: Secondary | ICD-10-CM

## 2024-08-13 MED ORDER — CARVEDILOL 12.5 MG PO TABS: 12.5000 mg | ORAL_TABLET | Freq: Two times a day (BID) | ORAL | 0 refills | Status: DC

## 2024-08-13 NOTE — Progress Notes (Signed)
 Cardiology Office Note   Date:  08/13/2024   ID:  Margaret, Hendricks August 31, 1956, MRN [id number]  PCP:  Fernand Fredy RAMAN, MD  Cardiologist:  Denyse Fernand, MD      History of Present Illness: Margaret Hendricks is a 68 y.o. female who presents for  Chief Complaint  Patient presents with   Follow-up    Rx Refill    Has SOB on exertion.      Past Medical History:  Diagnosis Date   Abnormal heart rhythm    AICD (automatic cardioverter/defibrillator) present    Anemia    Cancer (HCC)    skin   Chronic systolic CHF (congestive heart failure), NYHA class 2- 3 (HCC) 07/03/2016   COPD (chronic obstructive pulmonary disease) (HCC)    Diabetes mellitus without complication (HCC)    History of colonic polyps    LBBB (left bundle branch block) 07/03/2016   NICM (nonischemic cardiomyopathy) (HCC) 07/03/2016   Shortness of breath      Past Surgical History:  Procedure Laterality Date   APPENDECTOMY     CARDIAC CATHETERIZATION Left 03/14/2015   Procedure: Left Heart Cath;  Surgeon: Denyse DELENA Fernand, MD;  Location: [facility] INVASIVE CV LAB;  Service: Cardiovascular;  Laterality: Left;   COLONOSCOPY WITH PROPOFOL  N/A 05/12/2018   Procedure: COLONOSCOPY WITH PROPOFOL ;  Surgeon: Gaylyn Gladis PENNER, MD;  Location: [facility] ENDOSCOPY;  Service: Endoscopy;  Laterality: N/A;   EP IMPLANTABLE DEVICE N/A 07/16/2016   Procedure: BiV ICD Insertion CRT-D;  Surgeon: Elspeth JAYSON Sage, MD;  Location: [facility] INVASIVE CV LAB;  Service: Cardiovascular;  Laterality: N/A;   ESOPHAGOGASTRODUODENOSCOPY (EGD) WITH PROPOFOL  N/A 05/12/2018   Procedure: ESOPHAGOGASTRODUODENOSCOPY (EGD) WITH PROPOFOL ;  Surgeon: Gaylyn Gladis PENNER, MD;  Location: [facility] ENDOSCOPY;  Service: Endoscopy;  Laterality: N/A;   ICD IMPLANT     INSERT / REPLACE / REMOVE PACEMAKER     TUBAL LIGATION Bilateral      Current Outpatient Medications  Medication Sig Dispense Refill   amitriptyline (ELAVIL) 10 MG tablet TAKE 1 TABLET AT BEDTIME  90 tablet 3   atorvastatin (LIPITOR) 80 MG tablet TAKE 1 TABLET EVERY DAY 90 tablet 3   baclofen  (LIORESAL ) 10 MG tablet Take 1 tablet (10 mg total) by mouth daily. 30 tablet 1   calcium carbonate (OSCAL) 1500 (600 Ca) MG TABS tablet Take by mouth 2 (two) times daily with a meal.     carvedilol  (COREG ) 12.5 MG tablet Take 1 tablet (12.5 mg total) by mouth 2 (two) times daily with a meal. 90 tablet 0   celecoxib (CELEBREX) 200 MG capsule TAKE 1 CAPSULE EVERY DAY 90 capsule 3   cholecalciferol (VITAMIN D) 1000 units tablet Take 1,000 Units by mouth daily.     Coenzyme Q10 (COQ-10) 30 MG CAPS Take by mouth.     ferrous sulfate 325 (65 FE) MG tablet Take 325 mg by mouth 2 (two) times daily with a meal.     furosemide  (LASIX ) 20 MG tablet TAKE 1 TABLET EVERY DAY (NEED MD APPOINTMENT) 90 tablet 3   Multiple Vitamins-Minerals (ICAPS AREDS FORMULA PO) Take 1 tablet by mouth daily. (Patient not taking: Reported on 08/10/2024)     pantoprazole  (PROTONIX ) 40 MG tablet TAKE 1 TABLET EVERY DAY 90 tablet 0   sacubitril -valsartan  (ENTRESTO ) 97-103 MG TAKE 1 TABLET TWICE DAILY 180 tablet 3   sertraline (ZOLOFT) 50 MG tablet TAKE 1 TABLET EVERY DAY (NEED MD APPOINTMENT) 90 tablet 3   spironolactone  (ALDACTONE ) 25  MG tablet Take 25 mg by mouth daily.     No current facility-administered medications for this visit.    Allergies:   Tape    Social History:   reports that she quit smoking about 4 years ago. Her smoking use included cigarettes. She has a 3 pack-year smoking history. She has never used smokeless tobacco. She reports that she does not drink alcohol and does not use drugs.   Family History:  family history includes Heart attack in an other family member; Hypertension in an other family member.    ROS:     Review of Systems  Constitutional: Negative.   HENT: Negative.    Eyes: Negative.   Respiratory: Negative.    Gastrointestinal: Negative.   Genitourinary: Negative.   Musculoskeletal:  Negative.   Skin: Negative.   Neurological: Negative.   Endo/Heme/Allergies: Negative.   Psychiatric/Behavioral: Negative.    All other systems reviewed and are negative.     All other systems are reviewed and negative.    PHYSICAL EXAM: VS:  BP 128/62   Pulse 65   Ht 5' 2 (1.575 m)   Wt 167 lb (75.8 kg)   SpO2 96%   BMI 30.54 kg/m  , BMI Body mass index is 30.54 kg/m. Last weight:  Wt Readings from Last 3 Encounters:  08/13/24 167 lb (75.8 kg)  08/10/24 168 lb (76.2 kg)  04/07/24 166 lb 3.2 oz (75.4 kg)     Physical Exam Constitutional:      Appearance: Normal appearance.  Cardiovascular:     Rate and Rhythm: Normal rate and regular rhythm.     Heart sounds: Normal heart sounds.  Pulmonary:     Effort: Pulmonary effort is normal.     Breath sounds: Normal breath sounds.  Musculoskeletal:     Right lower leg: No edema.     Left lower leg: No edema.  Neurological:     Mental Status: She is alert.       EKG:   Recent Labs: 08/10/2024: ALT 13; BUN 12; Creatinine, Ser 0.73; Hemoglobin 11.3; Platelets 160; Potassium 4.6; Sodium 139    Lipid Panel    Component Value Date/Time   CHOL 127 08/10/2024 1009   TRIG 87 08/10/2024 1009   HDL 62 08/10/2024 1009   CHOLHDL 2.7 03/14/2015 0357   VLDL 23 03/14/2015 0357   LDLCALC 48 08/10/2024 1009      Other studies Reviewed: Additional studies/ records that were reviewed today include:  Review of the above records demonstrates:      07/03/2016   10:52 AM  PAD Screen  Previous PAD dx? No  Previous surgical procedure? No  Pain with walking? Yes  Subsides with rest? Yes  Feet/toe relief with dangling? No  Painful, non-healing ulcers? No  Extremities discolored? No      ASSESSMENT AND PLAN:    ICD-10-CM   1. Combined hyperlipidemia associated with type 2 diabetes mellitus (HCC)  E11.69 carvedilol  (COREG ) 12.5 MG tablet   E78.2 PCV ECHOCARDIOGRAM COMPLETE    2. Type 2 diabetes mellitus with  hyperglycemia, without long-term current use of insulin (HCC)  E11.65 carvedilol  (COREG ) 12.5 MG tablet    PCV ECHOCARDIOGRAM COMPLETE    3. Hypertension associated with diabetes (HCC)  E11.59 carvedilol  (COREG ) 12.5 MG tablet   I15.2 PCV ECHOCARDIOGRAM COMPLETE    4. Chronic systolic CHF (congestive heart failure), NYHA class 2- 3 (HCC)  I50.22 carvedilol  (COREG ) 12.5 MG tablet    PCV ECHOCARDIOGRAM COMPLETE  Problem List Items Addressed This Visit       Cardiovascular and Mediastinum   Chronic systolic CHF (congestive heart failure), NYHA class 2- 3 (HCC)   Relevant Medications   carvedilol  (COREG ) 12.5 MG tablet   Other Relevant Orders   PCV ECHOCARDIOGRAM COMPLETE   Hypertension associated with diabetes (HCC)   Relevant Medications   carvedilol  (COREG ) 12.5 MG tablet   Other Relevant Orders   PCV ECHOCARDIOGRAM COMPLETE     Endocrine   Type 2 diabetes mellitus with hyperglycemia, without long-term current use of insulin (HCC)   Relevant Medications   carvedilol  (COREG ) 12.5 MG tablet   Other Relevant Orders   PCV ECHOCARDIOGRAM COMPLETE   Combined hyperlipidemia associated with type 2 diabetes mellitus (HCC) - Primary   Relevant Medications   carvedilol  (COREG ) 12.5 MG tablet   Other Relevant Orders   PCV ECHOCARDIOGRAM COMPLETE       Disposition:   Return in about 6 weeks (around 09/24/2024) for echo and ff/u.    Total time spent: 35 minutes  Signed,  Denyse Bathe, MD  08/13/2024 10:43 AM    [facility]

## 2024-08-17 ENCOUNTER — Ambulatory Visit: Payer: Medicare HMO

## 2024-08-17 DIAGNOSIS — I5022 Chronic systolic (congestive) heart failure: Secondary | ICD-10-CM

## 2024-08-18 LAB — CUP PACEART REMOTE DEVICE CHECK
Battery Remaining Longevity: 8 mo
Battery Voltage: 2.84 V
Brady Statistic AP VP Percent: 0.33 %
Brady Statistic AP VS Percent: 0.03 %
Brady Statistic AS VP Percent: 97.67 %
Brady Statistic AS VS Percent: 1.97 %
Brady Statistic RA Percent Paced: 0.36 %
Brady Statistic RV Percent Paced: 7.47 %
Date Time Interrogation Session: 20251020001703
HighPow Impedance: 73 Ohm
Implantable Lead Connection Status: 753985
Implantable Lead Connection Status: 753985
Implantable Lead Connection Status: 753985
Implantable Lead Implant Date: 20170918
Implantable Lead Implant Date: 20170918
Implantable Lead Implant Date: 20170918
Implantable Lead Location: 753858
Implantable Lead Location: 753859
Implantable Lead Location: 753860
Implantable Lead Model: 5076
Implantable Pulse Generator Implant Date: 20170918
Lead Channel Impedance Value: 1045 Ohm
Lead Channel Impedance Value: 1045 Ohm
Lead Channel Impedance Value: 1121 Ohm
Lead Channel Impedance Value: 260.473
Lead Channel Impedance Value: 272.552
Lead Channel Impedance Value: 279.933
Lead Channel Impedance Value: 297.365
Lead Channel Impedance Value: 313.212
Lead Channel Impedance Value: 380 Ohm
Lead Channel Impedance Value: 399 Ohm
Lead Channel Impedance Value: 456 Ohm
Lead Channel Impedance Value: 494 Ohm
Lead Channel Impedance Value: 551 Ohm
Lead Channel Impedance Value: 608 Ohm
Lead Channel Impedance Value: 646 Ohm
Lead Channel Impedance Value: 950 Ohm
Lead Channel Impedance Value: 950 Ohm
Lead Channel Impedance Value: 950 Ohm
Lead Channel Pacing Threshold Amplitude: 0.375 V
Lead Channel Pacing Threshold Amplitude: 0.625 V
Lead Channel Pacing Threshold Amplitude: 3 V
Lead Channel Pacing Threshold Pulse Width: 0.4 ms
Lead Channel Pacing Threshold Pulse Width: 0.4 ms
Lead Channel Pacing Threshold Pulse Width: 0.8 ms
Lead Channel Sensing Intrinsic Amplitude: 2.75 mV
Lead Channel Sensing Intrinsic Amplitude: 2.75 mV
Lead Channel Sensing Intrinsic Amplitude: 7.625 mV
Lead Channel Sensing Intrinsic Amplitude: 7.625 mV
Lead Channel Setting Pacing Amplitude: 2 V
Lead Channel Setting Pacing Amplitude: 2.5 V
Lead Channel Setting Pacing Amplitude: 3.5 V
Lead Channel Setting Pacing Pulse Width: 0.4 ms
Lead Channel Setting Pacing Pulse Width: 0.8 ms
Lead Channel Setting Sensing Sensitivity: 0.3 mV
Zone Setting Status: 755011

## 2024-08-20 ENCOUNTER — Other Ambulatory Visit: Payer: Self-pay | Admitting: Cardiology

## 2024-08-20 DIAGNOSIS — K219 Gastro-esophageal reflux disease without esophagitis: Secondary | ICD-10-CM

## 2024-08-21 NOTE — Progress Notes (Signed)
 Remote ICD Transmission

## 2024-08-22 ENCOUNTER — Ambulatory Visit: Payer: Self-pay | Admitting: Cardiology

## 2024-08-31 ENCOUNTER — Ambulatory Visit: Attending: Cardiology

## 2024-08-31 DIAGNOSIS — Z95810 Presence of automatic (implantable) cardiac defibrillator: Secondary | ICD-10-CM | POA: Diagnosis not present

## 2024-08-31 DIAGNOSIS — I5022 Chronic systolic (congestive) heart failure: Secondary | ICD-10-CM | POA: Diagnosis not present

## 2024-09-02 NOTE — Progress Notes (Signed)
 EPIC Encounter for ICM Monitoring  Patient Name: Margaret Hendricks is a 68 y.o. female Date: 09/02/2024 Primary Care Physican: Margaret Fredy RAMAN, MD Primary Cardiologist:  Margaret Hendricks: Margaret Hendricks:  97.5%                 11/20/2022 Weight: 174 lbs 11/27/2022 Weight: 168 lbs 06/27/2023 Office Weight: 172 lbs 11/26/2023 Weight: 172 lbs  12/04/2023 Weight: 168 lbs 08/13/2024 Office Weight: 167 lbs   Time in AT/AF    0.0 hr/day (0.0%)         Attempted call to patient and unable to reach.  Left detailed message per DPR regarding transmission.  Transmission results reviewed.    Since 07/27/2024 ICM Remote transmission, Optivol thoracic impedance suggesting normal fluid levels.   Prescribed:  Furosemide  20 mg 1 tablet daily Spironolactone  25 mg take 1 tablet daily   Labs: 10/03/2023 Creatinine 0.86, BUN 19, Potassium 4.8, Sodium 139, GFR 75  06/18/2023 Creatinine 0.76, BUN 18, Potassium 4.4, Sodium 141, GFR 87  02/25/2023 Creatinine 0.84, BUN 12, Potassium 4.8, Sodium 138, GFR 78  A complete set of results can be found in Results Review.   Recommendations:  Left voice mail with ICM number and encouraged to call if experiencing any fluid symptoms.   Follow-up plan: ICM clinic phone appointment on 10/12/2024.   91 day device clinic remote transmission 11/16/2024.     EP/Cardiology Office Visits:     Last EP office visit 03/07/2023 (needs to schedule yearly visit, no recall)    Copy of ICM check sent to Dr. Kennyth.  Remote monitoring is medically necessary for Heart Failure Management.    Daily Thoracic Impedance ICM trend: 06/01/2024 through 08/31/2024.    12-14 Month Thoracic Impedance ICM trend:     Margaret Hendricks Garner, RN 09/02/2024 9:08 AM

## 2024-09-05 ENCOUNTER — Ambulatory Visit: Payer: Self-pay | Admitting: Cardiology

## 2024-09-17 ENCOUNTER — Other Ambulatory Visit: Payer: Self-pay | Admitting: Internal Medicine

## 2024-09-17 DIAGNOSIS — G44209 Tension-type headache, unspecified, not intractable: Secondary | ICD-10-CM

## 2024-09-18 ENCOUNTER — Ambulatory Visit

## 2024-09-18 DIAGNOSIS — E1159 Type 2 diabetes mellitus with other circulatory complications: Secondary | ICD-10-CM

## 2024-09-18 DIAGNOSIS — I340 Nonrheumatic mitral (valve) insufficiency: Secondary | ICD-10-CM

## 2024-09-18 DIAGNOSIS — I5022 Chronic systolic (congestive) heart failure: Secondary | ICD-10-CM

## 2024-09-18 DIAGNOSIS — I361 Nonrheumatic tricuspid (valve) insufficiency: Secondary | ICD-10-CM | POA: Diagnosis not present

## 2024-09-18 DIAGNOSIS — E1165 Type 2 diabetes mellitus with hyperglycemia: Secondary | ICD-10-CM

## 2024-09-18 DIAGNOSIS — E1169 Type 2 diabetes mellitus with other specified complication: Secondary | ICD-10-CM

## 2024-09-24 ENCOUNTER — Other Ambulatory Visit: Payer: Self-pay | Admitting: Cardiovascular Disease

## 2024-09-24 DIAGNOSIS — E1165 Type 2 diabetes mellitus with hyperglycemia: Secondary | ICD-10-CM

## 2024-09-24 DIAGNOSIS — E1169 Type 2 diabetes mellitus with other specified complication: Secondary | ICD-10-CM

## 2024-09-24 DIAGNOSIS — I5022 Chronic systolic (congestive) heart failure: Secondary | ICD-10-CM

## 2024-09-24 DIAGNOSIS — E1159 Type 2 diabetes mellitus with other circulatory complications: Secondary | ICD-10-CM

## 2024-10-01 ENCOUNTER — Encounter: Payer: Self-pay | Admitting: Cardiovascular Disease

## 2024-10-01 ENCOUNTER — Ambulatory Visit (INDEPENDENT_AMBULATORY_CARE_PROVIDER_SITE_OTHER): Admitting: Cardiovascular Disease

## 2024-10-01 VITALS — BP 114/72 | HR 61 | Ht 62.0 in | Wt 163.6 lb

## 2024-10-01 DIAGNOSIS — R0602 Shortness of breath: Secondary | ICD-10-CM | POA: Diagnosis not present

## 2024-10-01 DIAGNOSIS — I5022 Chronic systolic (congestive) heart failure: Secondary | ICD-10-CM

## 2024-10-01 DIAGNOSIS — I5033 Acute on chronic diastolic (congestive) heart failure: Secondary | ICD-10-CM

## 2024-10-01 DIAGNOSIS — I152 Hypertension secondary to endocrine disorders: Secondary | ICD-10-CM

## 2024-10-01 DIAGNOSIS — I428 Other cardiomyopathies: Secondary | ICD-10-CM

## 2024-10-01 DIAGNOSIS — E1159 Type 2 diabetes mellitus with other circulatory complications: Secondary | ICD-10-CM

## 2024-10-01 MED ORDER — EMPAGLIFLOZIN 25 MG PO TABS: 25.0000 mg | ORAL_TABLET | Freq: Every day | ORAL | 2 refills | Status: AC

## 2024-10-01 NOTE — Progress Notes (Signed)
 Cardiology Office Note   Date:  10/01/2024   ID:  Aurie, Harroun [birth year], MRN [id number]  PCP:  Fernand Fredy RAMAN, MD  Cardiologist:  Denyse Fernand, MD      History of Present Illness: Margaret Hendricks is a 68 y.o. female who presents for  Chief Complaint  Patient presents with   Follow-up    Echo follow up. Needs uACR Prior to appt in Jan    No chest pain or SOB.      Past Medical History:  Diagnosis Date   Abnormal heart rhythm    AICD (automatic cardioverter/defibrillator) present    Anemia    Cancer (HCC)    skin   Chronic systolic CHF (congestive heart failure), NYHA class 2- 3 (HCC) 07/03/2016   COPD (chronic obstructive pulmonary disease) (HCC)    Diabetes mellitus without complication (HCC)    History of colonic polyps    LBBB (left bundle branch block) 07/03/2016   NICM (nonischemic cardiomyopathy) (HCC) 07/03/2016   Shortness of breath      Past Surgical History:  Procedure Laterality Date   APPENDECTOMY     CARDIAC CATHETERIZATION Left 03/14/2015   Procedure: Left Heart Cath;  Surgeon: Denyse DELENA Fernand, MD;  Location: [facility] INVASIVE CV LAB;  Service: Cardiovascular;  Laterality: Left;   COLONOSCOPY WITH PROPOFOL  N/A 05/12/2018   Procedure: COLONOSCOPY WITH PROPOFOL ;  Surgeon: Gaylyn Gladis PENNER, MD;  Location: [facility] ENDOSCOPY;  Service: Endoscopy;  Laterality: N/A;   EP IMPLANTABLE DEVICE N/A 07/16/2016   Procedure: BiV ICD Insertion CRT-D;  Surgeon: Elspeth JAYSON Sage, MD;  Location: [facility] INVASIVE CV LAB;  Service: Cardiovascular;  Laterality: N/A;   ESOPHAGOGASTRODUODENOSCOPY (EGD) WITH PROPOFOL  N/A 05/12/2018   Procedure: ESOPHAGOGASTRODUODENOSCOPY (EGD) WITH PROPOFOL ;  Surgeon: Gaylyn Gladis PENNER, MD;  Location: [facility] ENDOSCOPY;  Service: Endoscopy;  Laterality: N/A;   ICD IMPLANT     INSERT / REPLACE / REMOVE PACEMAKER     TUBAL LIGATION Bilateral      Current Outpatient Medications  Medication Sig Dispense Refill   amitriptyline (ELAVIL)  10 MG tablet TAKE 1 TABLET AT BEDTIME 90 tablet 3   atorvastatin (LIPITOR) 80 MG tablet TAKE 1 TABLET EVERY DAY 90 tablet 3   calcium carbonate (OSCAL) 1500 (600 Ca) MG TABS tablet Take by mouth 2 (two) times daily with a meal.     carvedilol  (COREG ) 12.5 MG tablet TAKE 1 TABLET TWICE DAILY WITH MEALS (NEED MD APPOINTMENT FOR REFILLS) 90 tablet 0   celecoxib (CELEBREX) 200 MG capsule TAKE 1 CAPSULE EVERY DAY 90 capsule 3   cholecalciferol (VITAMIN D) 1000 units tablet Take 1,000 Units by mouth daily.     Coenzyme Q10 (COQ-10) 30 MG CAPS Take by mouth.     empagliflozin (JARDIANCE) 25 MG TABS tablet Take 1 tablet (25 mg total) by mouth daily before breakfast. 30 tablet 2   ferrous sulfate 325 (65 FE) MG tablet Take 325 mg by mouth 2 (two) times daily with a meal.     furosemide  (LASIX ) 20 MG tablet TAKE 1 TABLET EVERY DAY (NEED MD APPOINTMENT) 90 tablet 3   pantoprazole  (PROTONIX ) 40 MG tablet TAKE 1 TABLET EVERY DAY 90 tablet 3   sacubitril -valsartan  (ENTRESTO ) 97-103 MG TAKE 1 TABLET TWICE DAILY 180 tablet 3   sertraline (ZOLOFT) 50 MG tablet TAKE 1 TABLET EVERY DAY (NEED MD APPOINTMENT) 90 tablet 3   spironolactone  (ALDACTONE ) 25 MG tablet Take 25 mg by mouth daily.     No  current facility-administered medications for this visit.    Allergies:   Tape    Social History:   reports that she quit smoking about 4 years ago. Her smoking use included cigarettes. She has a 3 pack-year smoking history. She has never used smokeless tobacco. She reports that she does not drink alcohol and does not use drugs.   Family History:  family history includes Heart attack in an other family member; Hypertension in an other family member.    ROS:     Review of Systems  Constitutional: Negative.   HENT: Negative.    Eyes: Negative.   Respiratory: Negative.    Gastrointestinal: Negative.   Genitourinary: Negative.   Musculoskeletal: Negative.   Skin: Negative.   Neurological: Negative.    Endo/Heme/Allergies: Negative.   Psychiatric/Behavioral: Negative.    All other systems reviewed and are negative.     All other systems are reviewed and negative.    PHYSICAL EXAM: VS:  BP 114/72   Pulse 61   Ht 5' 2 (1.575 m)   Wt 163 lb 9.6 oz (74.2 kg)   SpO2 93%   BMI 29.92 kg/m  , BMI Body mass index is 29.92 kg/m. Last weight:  Wt Readings from Last 3 Encounters:  10/01/24 163 lb 9.6 oz (74.2 kg)  08/13/24 167 lb (75.8 kg)  08/10/24 168 lb (76.2 kg)     Physical Exam Constitutional:      Appearance: Normal appearance.  Cardiovascular:     Rate and Rhythm: Normal rate and regular rhythm.     Heart sounds: Normal heart sounds.  Pulmonary:     Effort: Pulmonary effort is normal.     Breath sounds: Normal breath sounds.  Musculoskeletal:     Right lower leg: No edema.     Left lower leg: No edema.  Neurological:     Mental Status: She is alert.       EKG:   Recent Labs: 08/10/2024: ALT 13; BUN 12; Creatinine, Ser 0.73; Hemoglobin 11.3; Platelets 160; Potassium 4.6; Sodium 139    Lipid Panel    Component Value Date/Time   CHOL 127 08/10/2024 1009   TRIG 87 08/10/2024 1009   HDL 62 08/10/2024 1009   CHOLHDL 2.7 03/14/2015 0357   VLDL 23 03/14/2015 0357   LDLCALC 48 08/10/2024 1009      Other studies Reviewed: Additional studies/ records that were reviewed today include:  Review of the above records demonstrates:      07/03/2016   10:52 AM  PAD Screen  Previous PAD dx? No  Previous surgical procedure? No  Pain with walking? Yes  Subsides with rest? Yes  Feet/toe relief with dangling? No  Painful, non-healing ulcers? No  Extremities discolored? No      ASSESSMENT AND PLAN:    ICD-10-CM   1. Chronic systolic CHF (congestive heart failure), NYHA class 2- 3 (HCC)  I50.22 empagliflozin (JARDIANCE) 25 MG TABS tablet    2. Hypertension associated with diabetes (HCC)  E11.59 empagliflozin (JARDIANCE) 25 MG TABS tablet   I15.2     3.  NICM (nonischemic cardiomyopathy) (HCC)  I42.8 empagliflozin (JARDIANCE) 25 MG TABS tablet   Had normal coronaries, 2016, and had AICD. ECHO shows LVEF 57% grade 2 diastolic dysfunction. Add farxiga, already on entresto /coreg .    4. SOB (shortness of breath)  R06.02 empagliflozin (JARDIANCE) 25 MG TABS tablet    5. 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       Problem List Items Addressed This Visit       Cardiovascular and Mediastinum   NICM (nonischemic cardiomyopathy) (HCC)   Relevant Medications   empagliflozin (JARDIANCE) 25 MG TABS tablet   Chronic systolic CHF (congestive heart failure), NYHA class 2- 3 (HCC) - Primary   Relevant Medications   empagliflozin (JARDIANCE) 25 MG TABS tablet   Hypertension associated with diabetes (HCC)   Relevant Medications   empagliflozin (JARDIANCE) 25 MG TABS tablet   Other Visit Diagnoses       SOB (shortness of breath)       Relevant Medications   empagliflozin (JARDIANCE) 25 MG TABS tablet     CHF (congestive heart failure), NYHA class III, acute on chronic, diastolic (HCC)       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   Relevant Medications   empagliflozin (JARDIANCE) 25 MG TABS tablet          Disposition:   Return in about 3 months (around 12/30/2024).    Total time spent: 35 minutes  Signed,  Denyse Bathe, MD  10/01/2024 9:44 AM    [facility]

## 2024-10-12 ENCOUNTER — Ambulatory Visit

## 2024-10-12 DIAGNOSIS — I5022 Chronic systolic (congestive) heart failure: Secondary | ICD-10-CM | POA: Diagnosis not present

## 2024-10-12 DIAGNOSIS — Z95810 Presence of automatic (implantable) cardiac defibrillator: Secondary | ICD-10-CM

## 2024-10-16 ENCOUNTER — Telehealth: Payer: Self-pay

## 2024-10-16 NOTE — Progress Notes (Cosign Needed)
 EPIC Encounter for ICM Monitoring  Patient Name: Margaret Hendricks is a 68 y.o. female Date: 10/16/2024 Primary Care Physican: Fernand Fredy RAMAN, MD Primary Cardiologist:  Kennyth Electrophysiologist: Kennyth Pore Pacing:  98%                 11/20/2022 Weight: 174 lbs 11/27/2022 Weight: 168 lbs 06/27/2023 Office Weight: 172 lbs 11/26/2023 Weight: 172 lbs  12/04/2023 Weight: 168 lbs 08/13/2024 Office Weight: 167 lbs   Time in AT/AF    0.0 hr/day (0.0%)         Attempted call to patient and unable to reach.  Left detailed message per DPR regarding transmission.  Transmission results reviewed.    Since 08/31/2024 ICM Remote transmission, Optivol thoracic impedance suggesting normal fluid levels.   Prescribed:  Furosemide  20 mg 1 tablet daily Spironolactone  25 mg take 1 tablet daily   Labs: 08/10/2024 Creatinine 0.73, BUN 12, Potassium 4.6, Sodium 139, GFR 91 A complete set of results can be found in Results Review.   Recommendations:  Left voice mail with ICM number and encouraged to call if experiencing any fluid symptoms.   Follow-up plan: ICM clinic phone appointment on 11/17/2024.   91 day device clinic remote transmission 11/16/2024.     EP/Cardiology Office Visits:     Last EP office visit 03/07/2023 (needs to schedule yearly visit, no recall)    Copy of ICM check sent to Dr. Kennyth.   Remote monitoring is medically necessary for Heart Failure Management.    Daily Thoracic Impedance ICM trend: 07/15/2024 through 10/14/2024.    12-14 Month Thoracic Impedance ICM trend:     [name], RN 10/16/2024 12:26 PM

## 2024-10-16 NOTE — Telephone Encounter (Signed)
 Remote ICM transmission received.  Attempted call to patient regarding ICM remote transmission.  Left detailed message per DPR with ICM phone number to return call for any questions, concerns or fluid symptoms.

## 2024-11-08 ENCOUNTER — Other Ambulatory Visit: Payer: Self-pay | Admitting: Cardiovascular Disease

## 2024-11-08 DIAGNOSIS — E1169 Type 2 diabetes mellitus with other specified complication: Secondary | ICD-10-CM

## 2024-11-08 DIAGNOSIS — I5022 Chronic systolic (congestive) heart failure: Secondary | ICD-10-CM

## 2024-11-08 DIAGNOSIS — I152 Hypertension secondary to endocrine disorders: Secondary | ICD-10-CM

## 2024-11-08 DIAGNOSIS — E1165 Type 2 diabetes mellitus with hyperglycemia: Secondary | ICD-10-CM

## 2024-11-10 ENCOUNTER — Ambulatory Visit: Admitting: Internal Medicine

## 2024-11-12 ENCOUNTER — Ambulatory Visit: Payer: Self-pay | Admitting: Internal Medicine

## 2024-11-12 ENCOUNTER — Ambulatory Visit (INDEPENDENT_AMBULATORY_CARE_PROVIDER_SITE_OTHER): Admitting: Internal Medicine

## 2024-11-12 ENCOUNTER — Encounter: Payer: Self-pay | Admitting: Internal Medicine

## 2024-11-12 VITALS — BP 110/68 | HR 69 | Ht 62.0 in | Wt 170.4 lb

## 2024-11-12 DIAGNOSIS — E1159 Type 2 diabetes mellitus with other circulatory complications: Secondary | ICD-10-CM | POA: Diagnosis not present

## 2024-11-12 DIAGNOSIS — I152 Hypertension secondary to endocrine disorders: Secondary | ICD-10-CM | POA: Diagnosis not present

## 2024-11-12 DIAGNOSIS — E782 Mixed hyperlipidemia: Secondary | ICD-10-CM | POA: Diagnosis not present

## 2024-11-12 DIAGNOSIS — Z0001 Encounter for general adult medical examination with abnormal findings: Secondary | ICD-10-CM

## 2024-11-12 DIAGNOSIS — D696 Thrombocytopenia, unspecified: Secondary | ICD-10-CM | POA: Diagnosis not present

## 2024-11-12 DIAGNOSIS — E1165 Type 2 diabetes mellitus with hyperglycemia: Secondary | ICD-10-CM | POA: Diagnosis not present

## 2024-11-12 DIAGNOSIS — E1169 Type 2 diabetes mellitus with other specified complication: Secondary | ICD-10-CM

## 2024-11-12 DIAGNOSIS — Z0000 Encounter for general adult medical examination without abnormal findings: Secondary | ICD-10-CM

## 2024-11-12 LAB — POC CREATINE & ALBUMIN,URINE
Albumin/Creatinine Ratio, Urine, POC: <30
Creatinine, POC: 100 mg/dL
Microalbumin Ur, POC: 10 mg/L

## 2024-11-12 LAB — POCT CBG (FASTING - GLUCOSE)-MANUAL ENTRY: Glucose Fasting, POC: 130 mg/dL — AB (ref 70–99)

## 2024-11-12 NOTE — Progress Notes (Signed)
 "  Established Patient Office Visit  Subjective:  Patient ID: Margaret Hendricks, female    DOB: September 22, 1956  Age: 69 y.o. MRN: [id number]  Chief Complaint  Patient presents with   Follow-up    3 month follow up    Patient comes in for follow up and AWV. She is generally feeling well and has no new complaints. Vision has improved with new glasses,despite issues before. However she has been busy with her mother's health issues, so missed her mammogram and GI appointments. Patient will get both rescheduled. She will return fasting for labs. PHQ-9/GAD-7 score is 5/7 CIT score is 4. Urine microalbumin today. DEXA will be scheduled around August 2026    No other concerns at this time.   Past Medical History:  Diagnosis Date   Abnormal heart rhythm    AICD (automatic cardioverter/defibrillator) present    Anemia    Cancer (HCC)    skin   Chronic systolic CHF (congestive heart failure), NYHA class 2- 3 (HCC) 07/03/2016   COPD (chronic obstructive pulmonary disease) (HCC)    Diabetes mellitus without complication (HCC)    History of colonic polyps    LBBB (left bundle branch block) 07/03/2016   NICM (nonischemic cardiomyopathy) (HCC) 07/03/2016   Shortness of breath     Past Surgical History:  Procedure Laterality Date   APPENDECTOMY     CARDIAC CATHETERIZATION Left 03/14/2015   Procedure: Left Heart Cath;  Surgeon: Denyse DELENA Bathe, MD;  Location: [facility] INVASIVE CV LAB;  Service: Cardiovascular;  Laterality: Left;   COLONOSCOPY WITH PROPOFOL  N/A 05/12/2018   Procedure: COLONOSCOPY WITH PROPOFOL ;  Surgeon: Gaylyn Gladis PENNER, MD;  Location: [facility] ENDOSCOPY;  Service: Endoscopy;  Laterality: N/A;   EP IMPLANTABLE DEVICE N/A 07/16/2016   Procedure: BiV ICD Insertion CRT-D;  Surgeon: Elspeth JAYSON Sage, MD;  Location: [facility] INVASIVE CV LAB;  Service: Cardiovascular;  Laterality: N/A;   ESOPHAGOGASTRODUODENOSCOPY (EGD) WITH PROPOFOL  N/A 05/12/2018   Procedure: ESOPHAGOGASTRODUODENOSCOPY (EGD) WITH  PROPOFOL ;  Surgeon: Gaylyn Gladis PENNER, MD;  Location: [facility] ENDOSCOPY;  Service: Endoscopy;  Laterality: N/A;   ICD IMPLANT     INSERT / REPLACE / REMOVE PACEMAKER     TUBAL LIGATION Bilateral     Social History   Socioeconomic History   Marital status: Single    Spouse name: Not on file   Number of children: Not on file   Years of education: Not on file   Highest education level: Not on file  Occupational History   Not on file  Tobacco Use   Smoking status: Former    Current packs/day: 0.00    Average packs/day: 0.1 packs/day for 30.0 years (3.0 ttl pk-yrs)    Types: Cigarettes    Quit date: 2021    Years since quitting: 5.0   Smokeless tobacco: Never   Tobacco comments:    Elayah admits she smokes 1/2 cigarette in am on the way to work and 1/2 cigarette on the way home from work.   Vaping Use   Vaping status: Never Used  Substance and Sexual Activity   Alcohol use: No    Alcohol/week: 0.0 standard drinks of alcohol   Drug use: Never    Types: Marijuana   Sexual activity: Not on file  Other Topics Concern   Not on file  Social History Narrative   Not on file   Social Drivers of Health   Tobacco Use: Medium Risk (11/12/2024)   Patient History    Smoking Tobacco Use: Former  Smokeless Tobacco Use: Never    Passive Exposure: Not on file  Financial Resource Strain: Low Risk (11/12/2024)   Overall Financial Resource Strain (CARDIA)    Difficulty of Paying Living Expenses: Not hard at all  Food Insecurity: No Food Insecurity (11/12/2024)   Epic    Worried About Programme Researcher, Broadcasting/film/video in the Last Year: Never true    Ran Out of Food in the Last Year: Never true  Transportation Needs: No Transportation Needs (11/12/2024)   Epic    Lack of Transportation (Medical): No    Lack of Transportation (Non-Medical): No  Physical Activity: Not on file  Stress: No Stress Concern Present (11/12/2024)   [employer] of Occupational Health - Occupational Stress Questionnaire     Feeling of Stress: Only a little  Social Connections: Not on file  Intimate Partner Violence: Not At Risk (11/12/2024)   Epic    Fear of Current or Ex-Partner: No    Emotionally Abused: No    Physically Abused: No    Sexually Abused: No  Depression (PHQ2-9): Medium Risk (11/12/2024)   Depression (PHQ2-9)    PHQ-2 Score: 5  Alcohol Screen: Low Risk (11/12/2024)   Alcohol Screen    Last Alcohol Screening Score (AUDIT): 0  Housing: Unknown (11/12/2024)   Epic    Unable to Pay for Housing in the Last Year: No    Number of Times Moved in the Last Year: Not on file    Homeless in the Last Year: No  Utilities: Not At Risk (11/12/2024)   Epic    Threatened with loss of utilities: No  Health Literacy: Adequate Health Literacy (11/12/2024)   B1300 Health Literacy    Frequency of need for help with medical instructions: Never    Family History  Problem Relation Age of Onset   Hypertension Other    Heart attack Other    Breast cancer Neg Hx     Allergies[1]  Show/hide medication list[2]  Review of Systems  Constitutional: Negative.  Negative for chills, fever and malaise/fatigue.  HENT: Negative.  Negative for congestion and sore throat.   Eyes: Negative.  Negative for blurred vision and pain.  Respiratory: Negative.  Negative for cough and shortness of breath.   Cardiovascular: Negative.  Negative for chest pain, palpitations and leg swelling.  Gastrointestinal: Negative.  Negative for abdominal pain, blood in stool, constipation, diarrhea, heartburn, melena, nausea and vomiting.  Genitourinary: Negative.  Negative for dysuria, flank pain, frequency and urgency.  Musculoskeletal: Negative.  Negative for joint pain and myalgias.  Skin: Negative.   Neurological: Negative.  Negative for dizziness, tingling, sensory change, weakness and headaches.  Endo/Heme/Allergies: Negative.   Psychiatric/Behavioral: Negative.  Negative for depression and suicidal ideas. The patient is not  nervous/anxious.        Objective:   BP 110/68   Pulse 69   Ht 5' 2 (1.575 m)   Wt 170 lb 6.4 oz (77.3 kg)   SpO2 94%   BMI 31.17 kg/m   Vitals:   11/12/24 1446  BP: 110/68  Pulse: 69  Height: 5' 2 (1.575 m)  Weight: 170 lb 6.4 oz (77.3 kg)  SpO2: 94%  BMI (Calculated): 31.16    Physical Exam Vitals and nursing note reviewed.  Constitutional:      Appearance: Normal appearance.  HENT:     Head: Normocephalic and atraumatic.     Nose: Nose normal.     Mouth/Throat:     Mouth: Mucous membranes are moist.  Pharynx: Oropharynx is clear.  Eyes:     Conjunctiva/sclera: Conjunctivae normal.     Pupils: Pupils are equal, round, and reactive to light.  Cardiovascular:     Rate and Rhythm: Normal rate and regular rhythm.     Pulses: Normal pulses.     Heart sounds: Normal heart sounds. No murmur heard. Pulmonary:     Effort: Pulmonary effort is normal.     Breath sounds: Normal breath sounds. No wheezing.  Abdominal:     General: Bowel sounds are normal.     Palpations: Abdomen is soft.     Tenderness: There is no abdominal tenderness. There is no right CVA tenderness or left CVA tenderness.  Musculoskeletal:        General: Normal range of motion.     Cervical back: Normal range of motion.     Right lower leg: No edema.     Left lower leg: No edema.  Skin:    General: Skin is warm and dry.  Neurological:     General: No focal deficit present.     Mental Status: She is alert and oriented to person, place, and time.  Psychiatric:        Mood and Affect: Mood normal.        Behavior: Behavior normal.      Results for orders placed or performed in visit on 11/12/24  POCT CBG (Fasting - Glucose)  Result Value Ref Range   Glucose Fasting, POC 130 (A) 70 - 99 mg/dL  POC CREATINE & ALBUMIN,URINE  Result Value Ref Range   Microalbumin Ur, POC 10 mg/L   Creatinine, POC 100 mg/dL   Albumin/Creatinine Ratio, Urine, POC <30     Recent Results (from the past  2160 hours)  CUP PACEART REMOTE DEVICE CHECK     Status: None   Collection Time: 08/17/24 12:17 AM  Result Value Ref Range   Date Time Interrogation Session [id number]    Pulse Generator Manufacturer MERM    Pulse Gen Model [id number] Claria MRI Quad CRT-D    Pulse Gen Serial Number [id number]    Clinic Name [facility]    Implantable Pulse Generator Type Cardiac Resynch Therapy Defibulator    Implantable Pulse Generator Implant Date 79829081    Implantable Lead Manufacturer MERM    Implantable Lead Model 5076 CapSureFix Novus MRI SureScan    Implantable Lead Serial Number [id number]    Implantable Lead Implant Date 79829081    Implantable Lead Location Detail 1 APPENDAGE    Implantable Lead Location [id number]    Implantable Lead Connection Status [id number]    Implantable Lead Manufacturer [facility]    Implantable Lead Model [phone] Sprint Quattro Secure S MRI SureScan    Implantable Lead Serial Number [id number]    Implantable Lead Implant Date 79829081    Implantable Lead Location Detail 1 APEX    Implantable Lead Location [id number]    Implantable Lead Connection Status [id number]    Implantable Lead Manufacturer [facility]    Implantable Lead Model 1458Q Quartet    Implantable Lead Serial Number [id number]    Implantable Lead Implant Date 79829081    Implantable Lead Location Detail 1 UNKNOWN    Implantable Lead Location [id number]    Implantable Lead Connection Status [id number]    Lead Channel Setting Sensing Sensitivity 0.3 mV   Lead Channel Setting Pacing Amplitude 2 V   Lead Channel Setting Pacing Pulse Width 0.4 ms   Lead Channel Setting Pacing Amplitude 2.5 V   Lead  Channel Setting Pacing Pulse Width 0.8 ms   Lead Channel Setting Pacing Amplitude 3.5 V   Lead Channel Setting Pacing Capture Mode Adaptive Capture    Zone Setting Status Active    Zone Setting Status Inactive    Zone Setting Status Inactive    Zone Setting Status Inactive    Zone Setting Status [phone]    Lead Channel Impedance  Value 399 ohm   Lead Channel Sensing Intrinsic Amplitude 2.75 mV   Lead Channel Sensing Intrinsic Amplitude 2.75 mV   Lead Channel Pacing Threshold Amplitude 0.375 V   Lead Channel Pacing Threshold Pulse Width 0.4 ms   Lead Channel Impedance Value 456 ohm   Lead Channel Impedance Value 380 ohm   Lead Channel Sensing Intrinsic Amplitude 7.625 mV   Lead Channel Sensing Intrinsic Amplitude 7.625 mV   Lead Channel Pacing Threshold Amplitude 0.625 V   Lead Channel Pacing Threshold Pulse Width 0.4 ms   HighPow Impedance 73 ohm   Lead Channel Impedance Value 1,045 ohm   Lead Channel Impedance Value 1,121 ohm   Lead Channel Impedance Value 1,045 ohm   Lead Channel Impedance Value 950 ohm   Lead Channel Impedance Value 950 ohm   Lead Channel Impedance Value 950 ohm   Lead Channel Impedance Value 646 ohm   Lead Channel Impedance Value 551 ohm   Lead Channel Impedance Value 608 ohm   Lead Channel Impedance Value 494 ohm   Lead Channel Impedance Value 297.365    Lead Channel Impedance Value 313.212    Lead Channel Impedance Value 279.933    Lead Channel Impedance Value 260.473    Lead Channel Impedance Value 272.552    Lead Channel Pacing Threshold Amplitude 3 V   Lead Channel Pacing Threshold Pulse Width 0.8 ms   Battery Status OK    Battery Remaining Longevity 8 mo   Battery Voltage 2.84 V   Brady Statistic RA Percent Paced 0.36 %   Brady Statistic RV Percent Paced 7.47 %   Brady Statistic AP VP Percent 0.33 %   Brady Statistic AS VP Percent 97.67 %   Brady Statistic AP VS Percent 0.03 %   Brady Statistic AS VS Percent 1.97 %  POCT CBG (Fasting - Glucose)     Status: Abnormal   Collection Time: 11/12/24  2:55 PM  Result Value Ref Range   Glucose Fasting, POC 130 (A) 70 - 99 mg/dL  POC CREATINE & ALBUMIN,URINE     Status: None   Collection Time: 11/12/24  3:19 PM  Result Value Ref Range   Microalbumin Ur, POC 10 mg/L   Creatinine, POC 100 mg/dL   Albumin/Creatinine Ratio, Urine,  POC <30       Assessment & Plan:  Continue current meds. Return for labs. Reschedule mammogram, and GI appointment for colonoscopy- has personal history of tubular adenoma. Problem List Items Addressed This Visit       Cardiovascular and Mediastinum   Hypertension associated with diabetes (HCC)   Relevant Orders   CMP14+EGFR     Endocrine   Type 2 diabetes mellitus with hyperglycemia, without long-term current use of insulin (HCC)   Relevant Orders   POCT CBG (Fasting - Glucose) (Completed)   Hemoglobin A1c   POC CREATINE & ALBUMIN,URINE (Completed)   Combined hyperlipidemia associated with type 2 diabetes mellitus (HCC)   Relevant Orders   Lipid Panel w/o Chol/HDL Ratio     Hematopoietic and Hemostatic   Thrombocytopenia   Relevant Orders   CBC  with Diff   Other Visit Diagnoses       Medicare annual wellness visit, subsequent    -  Primary       Follow up in 3 months.   Total time spent: 30 minutes. This time includes review of previous notes and results and patient face to face interaction during today's visit.    FERNAND FREDY RAMAN, MD  11/12/2024   This document may have been prepared by [facility] Voice Recognition software and as such may include unintentional dictation errors.     [1]  Allergies Allergen Reactions   Tape Other (See Comments)    Adhesive-silicones  [2]  Outpatient Medications Prior to Visit  Medication Sig   amitriptyline (ELAVIL) 10 MG tablet TAKE 1 TABLET AT BEDTIME   atorvastatin (LIPITOR) 80 MG tablet TAKE 1 TABLET EVERY DAY   calcium carbonate (OSCAL) 1500 (600 Ca) MG TABS tablet Take by mouth 2 (two) times daily with a meal.   carvedilol  (COREG ) 12.5 MG tablet TAKE 1 TABLET TWICE DAILY WITH MEALS (NEED MD APPOINTMENT FOR REFILLS)   celecoxib (CELEBREX) 200 MG capsule TAKE 1 CAPSULE EVERY DAY   cholecalciferol (VITAMIN D) 1000 units tablet Take 1,000 Units by mouth daily.   Coenzyme Q10 (COQ-10) 30 MG CAPS Take by mouth.    empagliflozin  (JARDIANCE ) 25 MG TABS tablet Take 1 tablet (25 mg total) by mouth daily before breakfast.   ferrous sulfate 325 (65 FE) MG tablet Take 325 mg by mouth 2 (two) times daily with a meal.   furosemide  (LASIX ) 20 MG tablet TAKE 1 TABLET EVERY DAY (NEED MD APPOINTMENT)   pantoprazole  (PROTONIX ) 40 MG tablet TAKE 1 TABLET EVERY DAY   sacubitril -valsartan  (ENTRESTO ) 97-103 MG TAKE 1 TABLET TWICE DAILY   sertraline (ZOLOFT) 50 MG tablet TAKE 1 TABLET EVERY DAY (NEED MD APPOINTMENT)   spironolactone  (ALDACTONE ) 25 MG tablet Take 25 mg by mouth daily.   No facility-administered medications prior to visit.   "

## 2024-11-16 ENCOUNTER — Ambulatory Visit: Payer: Medicare HMO

## 2024-11-17 ENCOUNTER — Ambulatory Visit: Attending: Cardiology

## 2024-11-17 DIAGNOSIS — I5022 Chronic systolic (congestive) heart failure: Secondary | ICD-10-CM

## 2024-11-17 DIAGNOSIS — Z95810 Presence of automatic (implantable) cardiac defibrillator: Secondary | ICD-10-CM | POA: Diagnosis not present

## 2024-11-18 ENCOUNTER — Telehealth: Payer: Self-pay

## 2024-11-18 NOTE — Telephone Encounter (Signed)
 Remote ICM transmission received.  Attempted call to patient regarding ICM remote transmission.  Left detailed message per DPR with ICM phone number to return call for any questions, concerns or fluid symptoms.

## 2024-11-18 NOTE — Progress Notes (Signed)
 EPIC Encounter for ICM Monitoring  Patient Name: Margaret Hendricks is a 69 y.o. female Date: 11/18/2024 Primary Care Physican: Fernand Fredy RAMAN, MD Primary Cardiologist:  Kennyth Electrophysiologist: Kennyth Pore Pacing:  98.2%                 11/20/2022 Weight: 174 lbs 11/27/2022 Weight: 168 lbs 06/27/2023 Office Weight: 172 lbs 11/26/2023 Weight: 172 lbs  12/04/2023 Weight: 168 lbs 08/13/2024 Office Weight: 167 lbs   Time in AT/AF    0.0 hr/day (0.0%)         Attempted call to patient and unable to reach.  Left detailed message per DPR regarding transmission.  Transmission results reviewed.    Since 10/12/2024 ICM Remote transmission, [employer] thoracic impedance suggesting normal fluid levels with the exception of possible fluid accumulation starting 11/05/2024.   Prescribed:  Furosemide  20 mg 1 tablet daily Spironolactone  25 mg take 1 tablet daily   Labs: 08/10/2024 Creatinine 0.73, BUN 12, Potassium 4.6, Sodium 139, GFR 91 A complete set of results can be found in Results Review.   Recommendations:  Left voice mail with ICM number and encouraged to call if experiencing any fluid symptoms.   Follow-up plan: ICM clinic phone appointment on 11/23/2024 (manual) to recheck fluid levels.  Next 31 day ICM Report due 12/21/2024.   91 day device clinic remote transmission 02/15/2025.     EP/Cardiology Office Visits:  Left message to call office to schedule EP visit.    Last EP office visit 03/07/2023 (needs to schedule yearly visit, no recall)    Copy of ICM check sent to Dr. Kennyth.    Remote monitoring is medically necessary for Heart Failure Management.    Daily Thoracic Impedance ICM trend: 08/18/2024 through 11/17/2024.    12-14 Month Thoracic Impedance ICM trend:     Margaret RAMAN Garner, RN 11/18/2024 12:46 PM

## 2024-11-23 ENCOUNTER — Ambulatory Visit

## 2024-11-26 NOTE — Progress Notes (Signed)
 31 day ICM Remote transmission canceled due to [facility] clinic is on hold until further notice.  91 day remote monitoring will continue per protocol.

## 2024-11-29 ENCOUNTER — Other Ambulatory Visit: Payer: Self-pay | Admitting: Internal Medicine

## 2024-12-31 ENCOUNTER — Ambulatory Visit: Admitting: Cardiovascular Disease

## 2025-01-07 ENCOUNTER — Ambulatory Visit: Admitting: Cardiology

## 2025-02-11 ENCOUNTER — Ambulatory Visit: Admitting: Internal Medicine

## 2025-02-15 ENCOUNTER — Ambulatory Visit: Payer: Medicare HMO

## 2025-05-17 ENCOUNTER — Ambulatory Visit: Payer: Medicare HMO

## 2025-08-16 ENCOUNTER — Ambulatory Visit: Payer: Medicare HMO

## 2025-11-15 ENCOUNTER — Ambulatory Visit: Payer: Medicare HMO
